# Patient Record
Sex: Female | Born: 1995 | Race: White | Hispanic: No | Marital: Single | Smoking: Former smoker
Health system: Southern US, Community
[De-identification: ages and names within clinical notes are randomized; demographics above are authoritative.]

## PROBLEM LIST (undated history)

## (undated) DIAGNOSIS — G40909 Epilepsy, unspecified, not intractable, without status epilepticus: Secondary | ICD-10-CM

## (undated) DIAGNOSIS — N201 Calculus of ureter: Secondary | ICD-10-CM

## (undated) DIAGNOSIS — M549 Dorsalgia, unspecified: Secondary | ICD-10-CM

## (undated) DIAGNOSIS — F909 Attention-deficit hyperactivity disorder, unspecified type: Secondary | ICD-10-CM

## (undated) DIAGNOSIS — G253 Myoclonus: Secondary | ICD-10-CM

## (undated) DIAGNOSIS — R569 Unspecified convulsions: Secondary | ICD-10-CM

## (undated) DIAGNOSIS — R4189 Other symptoms and signs involving cognitive functions and awareness: Secondary | ICD-10-CM

## (undated) DIAGNOSIS — F329 Major depressive disorder, single episode, unspecified: Secondary | ICD-10-CM

## (undated) HISTORY — DX: Attention-deficit hyperactivity disorder, unspecified type: F90.9

## (undated) HISTORY — DX: Epilepsy, unspecified, not intractable, without status epilepticus: G40.909

## (undated) HISTORY — DX: Dorsalgia, unspecified: M54.9

---
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
## 2017-01-04 ENCOUNTER — Telehealth: Payer: Self-pay

## 2017-01-04 ENCOUNTER — Other Ambulatory Visit: Payer: Self-pay | Admitting: Neurology

## 2017-01-04 NOTE — Telephone Encounter (Signed)
-----   Message from Van ClinesKaren M Aquino, MD sent at 01/04/2017 11:39 AM EDT ----- Regarding: MRI results Pls let patient/mother know that MRI brain is normal, no evidence of tumor, stroke, or bleed. Thanks

## 2017-01-04 NOTE — Telephone Encounter (Signed)
Spoke with pt's mother, Lynden AngCathy, relaying message below.

## 2017-01-12 ENCOUNTER — Other Ambulatory Visit: Payer: Self-pay | Admitting: Neurology

## 2017-01-16 ENCOUNTER — Ambulatory Visit (INDEPENDENT_AMBULATORY_CARE_PROVIDER_SITE_OTHER): Payer: 59 | Admitting: Neurology

## 2017-01-16 DIAGNOSIS — G40309 Generalized idiopathic epilepsy and epileptic syndromes, not intractable, without status epilepticus: Secondary | ICD-10-CM | POA: Diagnosis not present

## 2017-01-21 ENCOUNTER — Telehealth: Payer: Self-pay | Admitting: Neurology

## 2017-01-21 NOTE — Telephone Encounter (Signed)
Caller: Barbera   Urgent? No  Reason for the call: She said she received a call on Friday maybe regarding EEG results. Thanks

## 2017-01-21 NOTE — Telephone Encounter (Signed)
Did you try to call this patient?

## 2017-01-22 NOTE — Procedures (Signed)
ELECTROENCEPHALOGRAM REPORT  Date of Study: 01/16/2017  Patient's Name: Maria Day MRN: [id number] Date of Birth: May 14, 1996  Referring Provider: Dr. Patrcia DollyKaren Kimbrely Buckel  Clinical History: This is an 21 year old woman with generalized convulsions and body jerks.  Medications: Keppra, Prozac, Concerta  Technical Summary: A multichannel digital 1-hour sleep-deprived EEG recording measured by the international 10-20 system with electrodes applied with paste and impedances below 5000 ohms performed in our laboratory with EKG monitoring in an awake and asleep patient.  Hyperventilation and photic stimulation were performed.  The digital EEG was referentially recorded, reformatted, and digitally filtered in a variety of bipolar and referential montages for optimal display.    Description: The patient is awake and asleep during the recording.  During maximal wakefulness, there is a symmetric, medium voltage 9 Hz posterior dominant rhythm that attenuates with eye opening.  The record is symmetric.  During drowsiness and sleep, there is an increase in theta and delta slowing of the background.  Vertex waves and symmetric sleep spindles were seen.  Hyperventilation and photic stimulation did not elicit any abnormalities.  During sleep, there were occasional irregular generalized high voltage 4-5 Hz spike and polyspike and wave discharges with frontal predominance seen. There were rare discharges seen on the right hemisphere, some generalized discharges were more prominent on the right. There were no electrographic seizures seen.    EKG lead was unremarkable.  Impression: This 1-hour awake and asleep EEG is abnormal due to the presence of occasional generalized irregular high voltage 4-5 Hz spike and polyspike and wave discharges, some with right-sided predominance.  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 The right-sided predominance of some  epileptiform discharges also raises the possibility of focal seizures with secondary bisynchrony.   Patrcia DollyKaren [name], M.D.

## 2017-01-22 NOTE — Telephone Encounter (Signed)
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 She is doing well on Keppra, no issues since dose changes. F/u as scheduled in Nov.

## 2017-01-23 ENCOUNTER — Telehealth: Payer: Self-pay | Admitting: Neurology

## 2017-01-23 NOTE — Telephone Encounter (Signed)
Rx for Keppra was sent to pharmacy ([facility] on [address]) on 01/14/17 with 11 refills.  Pt needs to check with pharmacy.

## 2017-01-23 NOTE — Telephone Encounter (Signed)
PT left a voicemail message saying she needs a refill of her keppra, she is almost out

## 2017-01-24 ENCOUNTER — Other Ambulatory Visit: Payer: Self-pay

## 2017-01-24 ENCOUNTER — Telehealth: Payer: Self-pay | Admitting: Neurology

## 2017-01-24 MED ORDER — LEVETIRACETAM 750 MG PO TABS: 750.0000 mg | ORAL_TABLET | Freq: Two times a day (BID) | ORAL | 1 refills | Status: DC

## 2017-01-24 NOTE — Telephone Encounter (Signed)
Spoke with pt's mother, Maria Day, who states that [place] is in [name] working with her father over the summer and is about to run out of her Keppra (500mg ).  She was recently increase from 1 to 1 1/2 tablets BID.  Send Rx for Keppra 750, 1 tab BID,  #30 with 1 refill to [name].  Pt or her mother will call back before refill runs out to update pharmacy.

## 2017-01-24 NOTE — Telephone Encounter (Signed)
Caller: Ladrea  Urgent? No  Reason for the call: She was seen yesterday and had her dosage changed on her medication. She would like to see if Dr. Karel JarvisAquino would send a new refill over to [name]. She said since it has been increased she will need a refill sooner. Please Advise. Thanks

## 2017-01-25 ENCOUNTER — Other Ambulatory Visit: Payer: Self-pay

## 2017-01-25 MED ORDER — LEVETIRACETAM 750 MG PO TABS: 750.0000 mg | ORAL_TABLET | Freq: Two times a day (BID) | ORAL | 3 refills | Status: DC

## 2017-05-29 ENCOUNTER — Ambulatory Visit: Payer: 59 | Admitting: Neurology

## 2017-05-29 ENCOUNTER — Telehealth: Payer: Self-pay | Admitting: Neurology

## 2017-05-29 ENCOUNTER — Encounter: Payer: Self-pay | Admitting: Neurology

## 2017-05-29 VITALS — BP 98/64 | HR 90 | Ht 59.0 in | Wt 171.0 lb

## 2017-05-29 DIAGNOSIS — G40309 Generalized idiopathic epilepsy and epileptic syndromes, not intractable, without status epilepticus: Secondary | ICD-10-CM

## 2017-05-29 DIAGNOSIS — G43009 Migraine without aura, not intractable, without status migrainosus: Secondary | ICD-10-CM | POA: Diagnosis not present

## 2017-05-29 MED ORDER — FLUOXETINE HCL 60 MG PO TABS: ORAL_TABLET | ORAL | 6 refills | Status: DC

## 2017-05-29 MED ORDER — LEVETIRACETAM 750 MG PO TABS: 750.0000 mg | ORAL_TABLET | Freq: Two times a day (BID) | ORAL | 3 refills | Status: DC

## 2017-05-29 MED ORDER — SUMATRIPTAN SUCCINATE 50 MG PO TABS: ORAL_TABLET | ORAL | 6 refills | Status: DC

## 2017-05-29 NOTE — Patient Instructions (Signed)
1. Continue Keppra 750mg  twice a day 2. Increase Prozac to 60mg  at bedtime 3. Take Sumatriptan (Imitrex) 50mg : one tablet at onset of migraine. Do not take more than 2-3 times a week 4. Keep a headache calendar with your sleep and food patterns 5. Follow-up in 6 months, call for any changes  Seizure Precautions: 1. If medication has been prescribed for you to prevent seizures, take it exactly as directed.  Do not stop taking the medicine without talking to your doctor first, even if you have not had a seizure in a long time.   2. Avoid activities in which a seizure would cause danger to yourself or to others.  Don't operate dangerous machinery, swim alone, or climb in high or dangerous places, such as on ladders, roofs, or girders.  Do not drive unless your doctor says you may.  3. If you have any warning that you may have a seizure, lay down in a safe place where you can't hurt yourself.    4.  No driving for 6 months from last seizure, [place].   Please refer to the following link on the Epilepsy Foundation of America's website for more information: http://www.epilepsyfoundation.org/answerplace/Social/driving/drivingu.cfm   5.  Maintain good sleep hygiene. Avoid alcohol.  6.  Notify your neurology if you are planning pregnancy or if you become pregnant.  7.  Contact your doctor if you have any problems that may be related to the medicine you are taking.  8.  Call 911 and bring the patient back to the ED if:        A.  The seizure lasts longer than 5 minutes.       B.  The patient doesn't awaken shortly after the seizure  C.  The patient has new problems such as difficulty seeing, speaking or moving  D.  The patient was injured during the seizure  E.  The patient has a temperature over 102 F (39C)  F.  The patient vomited and now is having trouble breathing

## 2017-05-29 NOTE — Telephone Encounter (Signed)
Pt left a voicemail message asking for a call back regarding the medications discussed for her headaches this morning

## 2017-05-29 NOTE — Progress Notes (Signed)
NEUROLOGY FOLLOW UP OFFICE NOTE  Wyman SongsterKatelyn Nickles [id number] 1996/05/21  HISTORY OF PRESENT ILLNESS: I had the pleasure of seeing Wyman SongsterKatelyn Westra in follow-up in the neurology clinic on 05/29/2017.  The patient was last seen 5 months ago for seizures. She is again accompanied by her mother who helps supplement the history today.  Records and images were personally reviewed where available.  I personally reviewed MRI brain with and without contrast which did not show any acute changes, hippocampi symmetric without abnormal signal or enhancement. Her 1-hour sleep-deprived EEG was abnormal with occasional generalized irregular high voltage 4-5 Hz spike and polyspike and wave discharges, some with right-sided predominance. She was restarted on Keppra, dose increased to 750mg  twice a day due to report of twitching. She denies any further twitching. She lives with her father and only sees her mother on the weekends, her mother has not seen any staring episodes. She denies any further seizures. Her main concern is an increase in headaches occurring around once a week lasting 2-3 days. She describes a throbbing pain behind her left eyeball with associated nausea, vomiting, no visual obscurations. Aleve calms it down but does not help much. Sleep deprivation is a trigger. She however notes that if she has a headache, sleeping makes it worse. She has had some loss of appetite. She also reports a sensation in her mouth where she would swallow but saliva builds up and she feels like her throat closes up that she would vomit. These may occur separate from the headaches. She feels they may be associated with anxiety, but sometimes feels they are not related. This occurred twice last month. She provides additional history of recent bout of pain from kidney stone.  HPI 12/17/2016: This is a 21 yo LH woman with a history of ADD and 3 seizures. Her mother reports the first seizure was at age 35 in the setting of a high fever,  she had a convulsion in the car seat that lasted at least 5 minutes. She did well until age 21 when she had a convulsion in the bathroom in January 2015. She was getting ready for school and recalls ironing her hair, then her mother heard her fall back and hit her head on the tub. She denied any warning symptoms prior to the GTC, she had a headache and vomiting after and was diagnosed with a concussion. She started seeing pediatric neurologist Dr. Sharene SkeansHickling, initial EEG showed diffuse post-ictal slowing. She also reported symptoms suggestive of myoclonic jerks where she would have a jerk in her left hand and drop things, she calls them "flinches," where she would be holding a pencil or bottle then lose control of her hand. They can occur at any time, she does not recall having them prior to the GTC. She had a head CT in 2015 which was unremarkable. Repeat sleep-deprived wake EEG was normal. She was started on Keppra, however called several times about mood and personality changes, as well as weight gain. Her mother reports an almost 40-lb weight gain in 4 months. She was given instructions to taper off Keppra and start Zonisamide, but it appears she was lost to follow-up and did not start the medication. She was back in the ER last 10/08/16, she states she had a seizure in her sleep, she fell and hit her head on the dresser. Her mother thinks she was already awake, the episode occurred around 6am. She reports being a little stressed recently with her brother's passing. She denies any  sleep deprivation. She had a headache the night prior, but it was no different from her usual headaches. Her mother heard her fall and again found her having a convulsion that lasted a few minutes. She was wide-eyed and confused after, no focal weakness. She was brought to [facility] ER where bloodwork was unremarkable, she was discharged home on Keppra 500mg  BID which she has been overall tolerating.  She reports the "flinches" are not  frequent, occurring more when she is under a lot of stress. She denies any gaps in time, but sometimes feels like "I am not here, not in my body sort of thing." Last episode was a week ago. She was having them more at school, around once a week. Her mother states she occasionally gets fixated on something but can be distracted. She denies any olfactory/gustatory hallucinations, deja vu, rising epigastric sensation, focal numbness/tingling/weakness. She has headaches occurring around  1-2 times a week, usually on the left side, making her feel nauseated with sensitivity to lights and sounds. She usually gets very ill and grumpy and would sleep it off. She occasionally takes aspirin. When her mother tells her that she should tell her about these, she states "you just don't pay attention." She had struggled with some learning issues growing up and did a vocational program, she will be seeing a Chief Operating Officer tomorrow to "determine if she has autism." She takes Concerta to help with focusing at school, since stopping it now that she has graduated, she is not having as much headaches. Her mother thinks she had more headaches when she would not eat because Concerta made her lose appetite. She has occasional dizziness "like I'm not here." She denies any diplopia, dysarthria/dysphagia, neck/back pain, bowel/bladder dysfunction. She can get aggravated easily, and states that her depression plays a role with her anxiety. Prozac helps, she can tell a big difference if she does not take her medication. She had a kidney stone in February 2018 that she passed the next day. She graduated high school last Wednesday and plans to work. She is not driving yet.   Epilepsy Risk Factors:  She had a febrile seizure at age 68. Her mother reports there was a "knot in the umbilical cord" and meconium in amniotic fluid, she needed antibiotics for a few days. Otherwise she had a normal early development.  There is no history of CNS  infections such as meningitis/encephalitis, significant traumatic brain injury, neurosurgical procedures, or family history of seizures.  Prior AEDs: Keppra  PAST MEDICAL HISTORY: Past Medical History:  Diagnosis Date  . ADHD (attention deficit hyperactivity disorder)   . Cognitive impairment   . Myoclonus   . Reactive depression   . Seizures (HCC)     MEDICATIONS: Current Outpatient Medications on File Prior to Visit  Medication Sig Dispense Refill  . FLUoxetine (PROZAC) 20 MG capsule Take 40 mg by mouth every morning.   1  . levETIRAcetam (KEPPRA) 750 MG tablet Take 1 tablet (750 mg total) by mouth 2 (two) times daily. 180 tablet 3  . methylphenidate (CONCERTA) 54 MG CR tablet Take 54 mg by mouth every morning.     No current facility-administered medications on file prior to visit.     ALLERGIES: Allergies  Allergen Reactions  . Penicillins Rash  . Amoxicillin Rash    FAMILY HISTORY: Family History  Problem Relation Age of Onset  . Cancer Father        Leukemia  . Hyperlipidemia Maternal Grandfather   . Diabetes Paternal  Grandfather     SOCIAL HISTORY: Social History   Socioeconomic History  . Marital status: Single    Spouse name: Not on file  . Number of children: Not on file  . Years of education: Not on file  . Highest education level: Not on file  Social Needs  . Financial resource strain: Not on file  . Food insecurity - worry: Not on file  . Food insecurity - inability: Not on file  . Transportation needs - medical: Not on file  . Transportation needs - non-medical: Not on file  Occupational History  . Not on file  Tobacco Use  . Smoking status: Never Smoker  . Smokeless tobacco: Never Used  Substance and Sexual Activity  . Alcohol use: No  . Drug use: No  . Sexual activity: No  Other Topics Concern  . Not on file  Social History Narrative  . Not on file    REVIEW OF SYSTEMS: Constitutional: No fevers, chills, or sweats, no generalized  fatigue, change in appetite Eyes: No visual changes, double vision, eye pain Ear, nose and throat: No hearing loss, ear pain, nasal congestion, sore throat Cardiovascular: No chest pain, palpitations Respiratory:  No shortness of breath at rest or with exertion, wheezes GastrointestinaI: No nausea, vomiting, diarrhea, abdominal pain, fecal incontinence Genitourinary:  No dysuria, urinary retention or frequency Musculoskeletal:  No neck pain, back pain Integumentary: No rash, pruritus, skin lesions Neurological: as above Psychiatric: No depression, insomnia, anxiety Endocrine: No palpitations, fatigue, diaphoresis, mood swings, change in appetite, change in weight, increased thirst Hematologic/Lymphatic:  No anemia, purpura, petechiae. Allergic/Immunologic: no itchy/runny eyes, nasal congestion, recent allergic reactions, rashes  PHYSICAL EXAM: Vitals:   05/29/17 0900  BP: 98/64  Pulse: 90  SpO2: 97%   General: No acute distress Head:  Normocephalic/atraumatic Neck: supple, no paraspinal tenderness, full range of motion Heart:  Regular rate and rhythm Lungs:  Clear to auscultation bilaterally Back: No paraspinal tenderness Skin/Extremities: No rash, no edema Neurological Exam: alert and oriented to person, place, and time. No aphasia or dysarthria. Fund of knowledge is appropriate.  Recent and remote memory are intact.  Attention and concentration are normal.    Able to name objects and repeat phrases. Cranial nerves: Pupils equal, round, reactive to light.  Fundoscopic exam unremarkable, no papilledema. Extraocular movements intact with no nystagmus. Visual fields full. Facial sensation intact. No facial asymmetry. Tongue, uvula, palate midline.  Motor: Bulk and tone normal, muscle strength 5/5 throughout with no pronator drift.  Sensation to light touch intact.  No extinction to double simultaneous stimulation.  Deep tendon reflexes 2+ throughout, toes downgoing.  Finger to nose testing  intact.  Gait narrow-based and steady, able to tandem walk adequately.  Romberg negative.  IMPRESSION: This is a pleasant 21 yo LH woman with a history of febrile seizure at age 89, then 2 generalized convulsions soon after awakening at age 21 and most recently last 10/08/16. She also describes "flinches" suggestive of myoclonic jerks. Symptoms suggestive of primary generalized epilepsy. MRI brain with and without contrast which did not show any acute changes, hippocampi symmetric without abnormal signal or enhancement. Her 1-hour sleep-deprived EEG was abnormal with occasional generalized irregular high voltage 4-5 Hz spike and polyspike and wave discharges, some with right-sided predominance. 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 She is doing well on Keppra 750mg  BID with no further seizures or seizure-like symptoms. She is reporting an increase in migraines without aura. We discussed how Fluoxetine can help with migraines as well,  increase dose to 60mg  daily. She will try prn Imitrex for migraine rescue. She knows to minimize rescue medications to 2-3 a week to avoid rebound headaches. She does not drive. She will keep a headache calendar and follow-up in 6 months. She knows to call for any changes.   Thank you for allowing me to participate in her care.  Please do not hesitate to call for any questions or concerns.  The duration of this appointment visit was 25 minutes of face-to-face time with the patient.  Greater than 50% of this time was spent in counseling, explanation of diagnosis, planning of further management, and coordination of care.   Patrcia Dolly, M.D.   CC: Dr. Jolaine Click

## 2017-05-30 NOTE — Telephone Encounter (Signed)
Spoke with pt's mother.  She states that pt's father is concerned about pt being on so many medications and that pt's father does not necessarily agree with pt being given Imitrex.  I explained that Imitrex is not every day medication, but only PRN for onset of headaches.  Mother states that pt "does have some mild issues, mentally" and feels that pt exaggerated about her headaches with Dr. Karel JarvisAquino during her visit yesterday (mother was in during the appointment as well).  Mother does not believe that Prozac needs to be increase - would prefer if pt stayed on 40mg .  Please advise.

## 2017-05-30 NOTE — Telephone Encounter (Signed)
Dr. Karel JarvisAquino is out of the office.  I don't want to change her plan but they can hold on the increase until she is back.  I do think that the patient herself would have to be agreeable since she is of age, if she is a competant 21y/o or unless parents have some type of guardianship over her.

## 2017-05-31 DIAGNOSIS — G43009 Migraine without aura, not intractable, without status migrainosus: Secondary | ICD-10-CM

## 2017-05-31 HISTORY — DX: Migraine without aura, not intractable, without status migrainosus: G43.009

## 2017-06-05 NOTE — Telephone Encounter (Signed)
Pls call her mother and let her know that since she is [age], [name] will have to know why medications are not being changed, meaning be told about her parents concerns. Does mother want to let [name] know or should our office tell her? Thanks

## 2017-06-06 NOTE — Telephone Encounter (Signed)
I spoke with patient's mother and she will have Maria Day call our office.

## 2017-06-07 NOTE — Progress Notes (Signed)
Received [facility] from [facility].  Request placed in in-house backset to fbe sent ot medical records department.

## 2017-11-26 ENCOUNTER — Ambulatory Visit: Payer: 59 | Admitting: Neurology

## 2018-04-10 DIAGNOSIS — G4733 Obstructive sleep apnea (adult) (pediatric): Secondary | ICD-10-CM | POA: Insufficient documentation

## 2018-04-10 HISTORY — DX: Obstructive sleep apnea (adult) (pediatric): G47.33

## 2018-06-19 IMAGING — CT CT RENAL STONE PROTOCOL
2 of 4 series · 15 of 46 positions shown, 17 images · non-contrast
Comparison: None.

CLINICAL DATA: Acute onset of left flank and left-sided abdominal
pain. Nausea and vomiting. Leukocytosis and bacteria noted in the
patient's urine. Initial encounter.

EXAM:
CT ABDOMEN AND PELVIS WITHOUT CONTRAST
TECHNIQUE: Multidetector CT imaging of the abdomen and pelvis was performed
following the standard protocol without IV contrast.

[Series 2: axial st · axial · 0.75mm/px · z∈[-373,+62]mm · 12 of 95 slices shown, 14 images]
[im 4/95  soft-tissue]
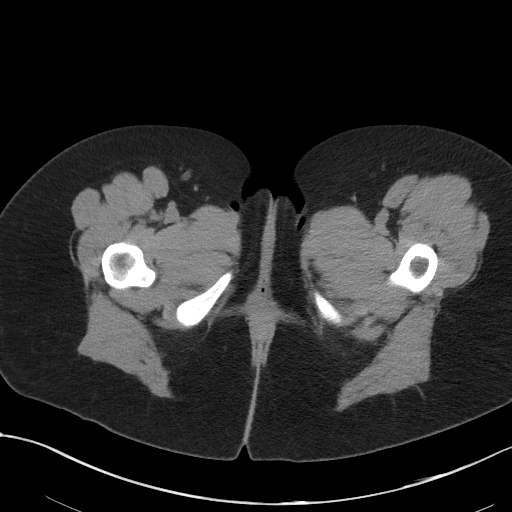
[im 4/95  bone]
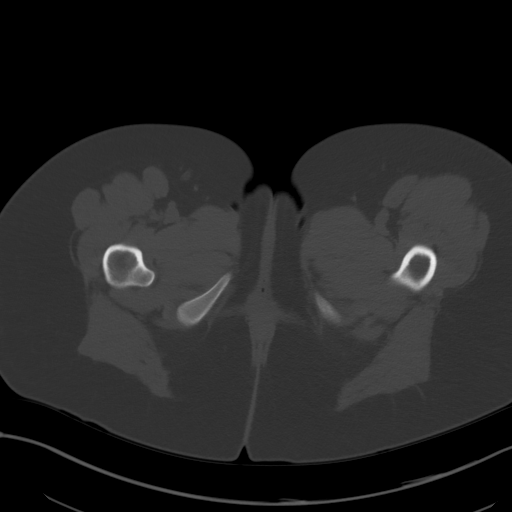
[im 12/95  soft-tissue]
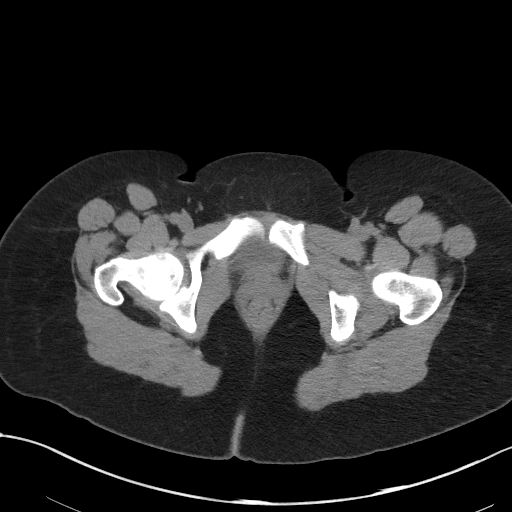
[im 20/95  soft-tissue]
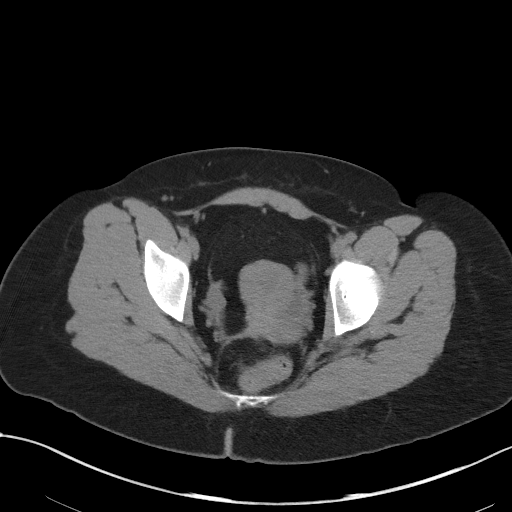
[im 28/95  soft-tissue]
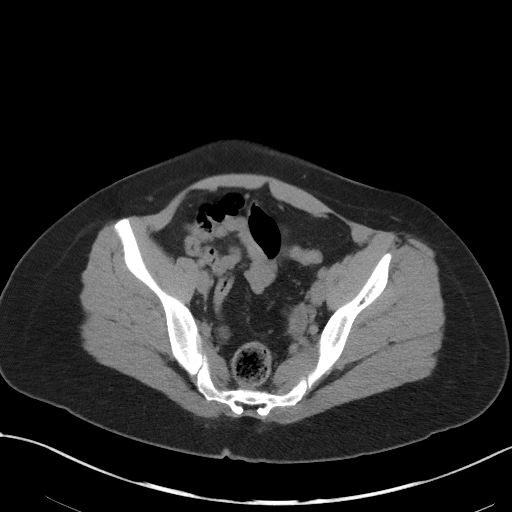
[im 36/95  soft-tissue]
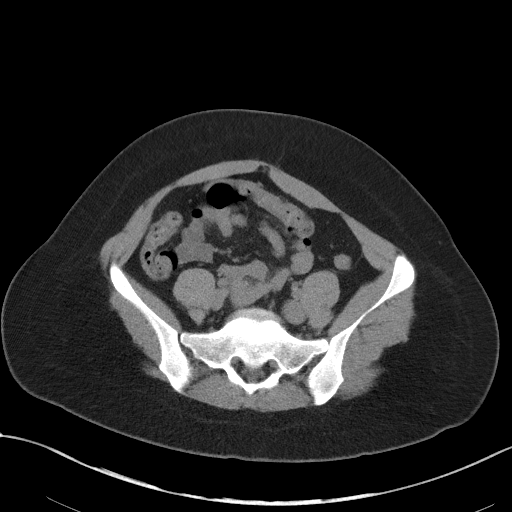
[im 44/95  soft-tissue]
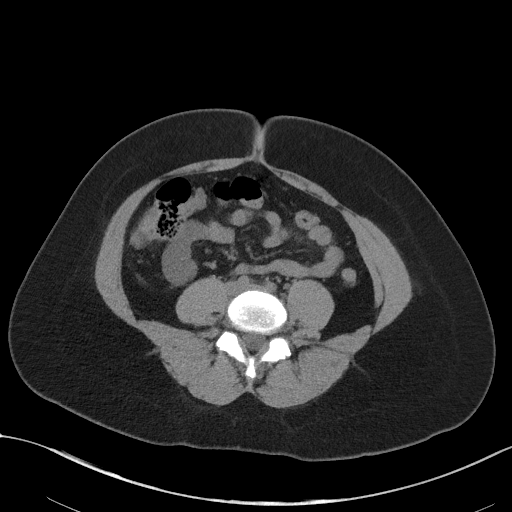
[im 51/95  soft-tissue]
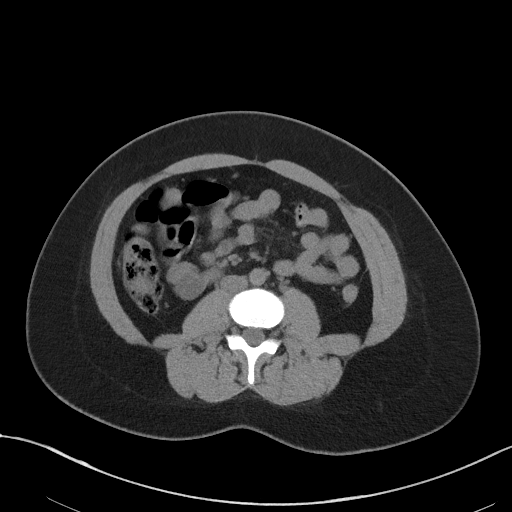
[im 59/95  soft-tissue]
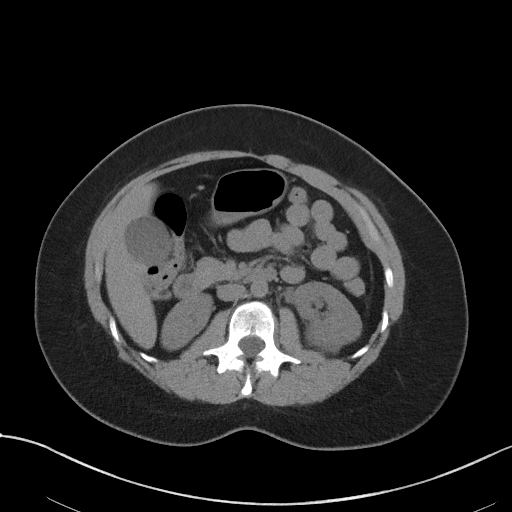
[im 67/95  soft-tissue]
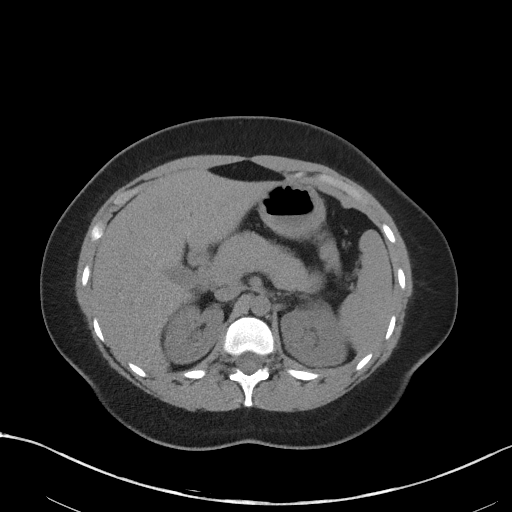
[im 67/95  bone]
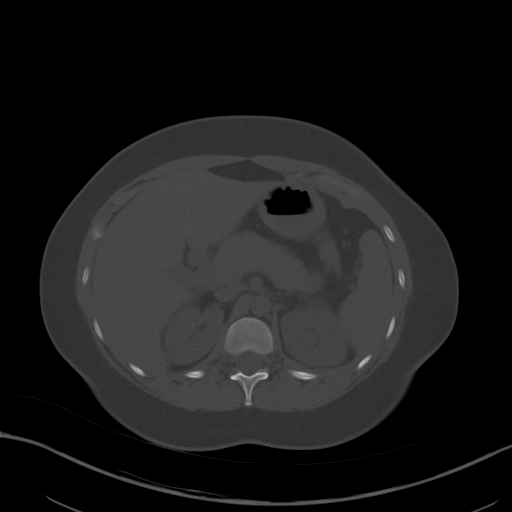
[im 75/95  soft-tissue]
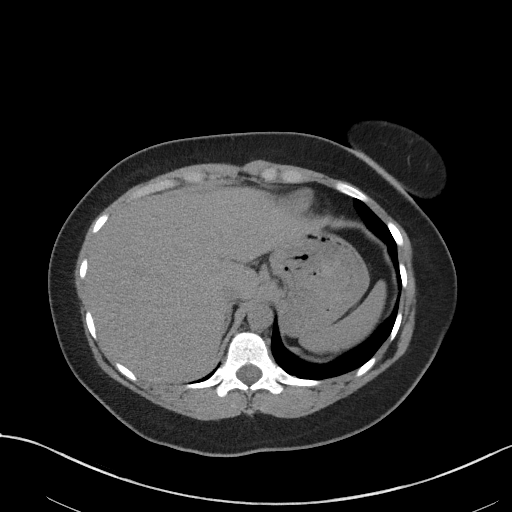
[im 83/95  soft-tissue]
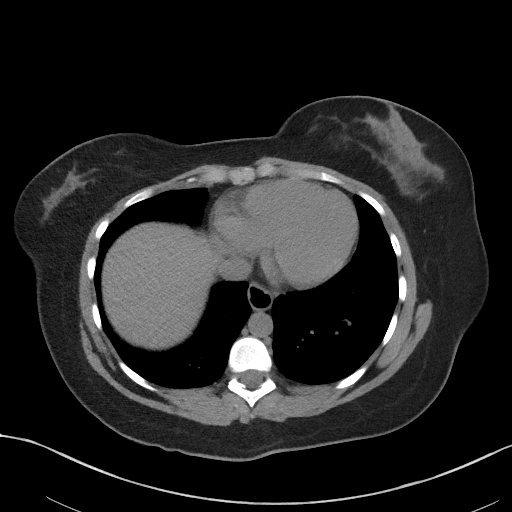
[im 91/95  soft-tissue]
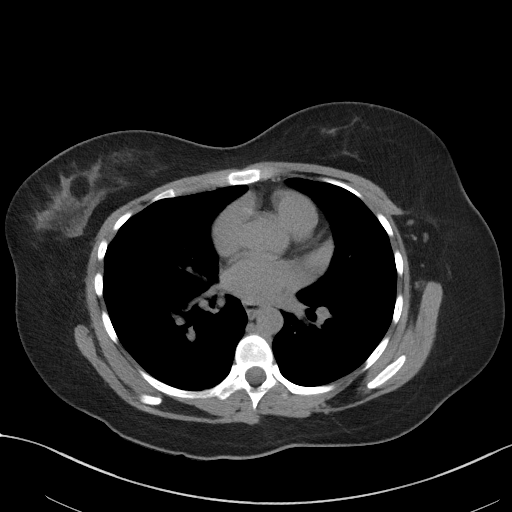

[Series 4: coronal st · coronal · 0.73mm/px · 3 of 82 slices shown]
[im 28/82  soft-tissue]
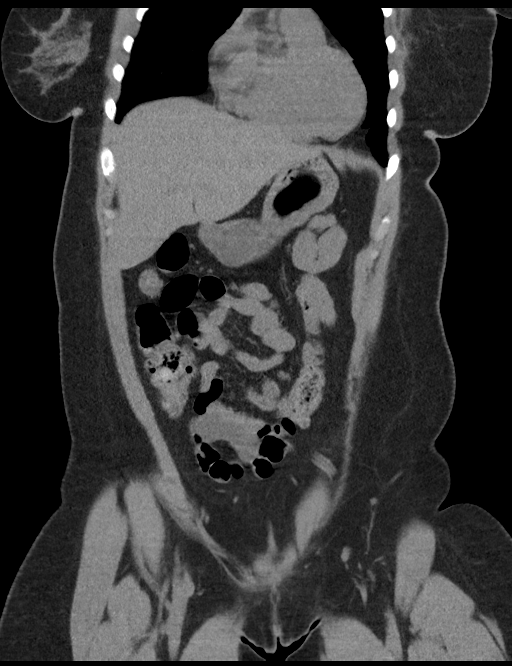
[im 37/82  soft-tissue]
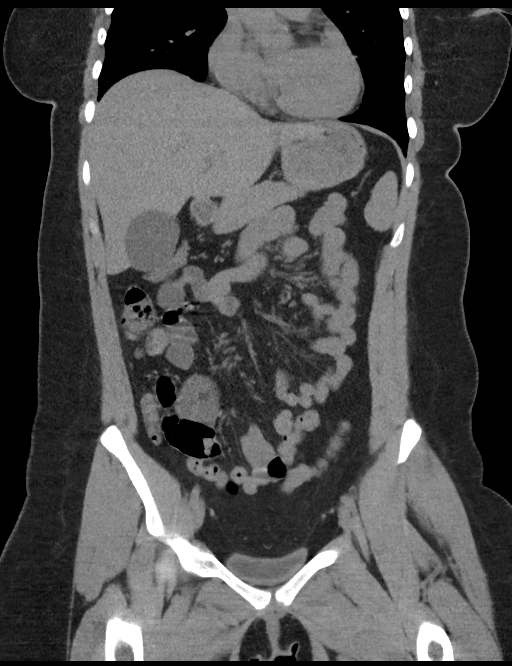
[im 46/82  soft-tissue]
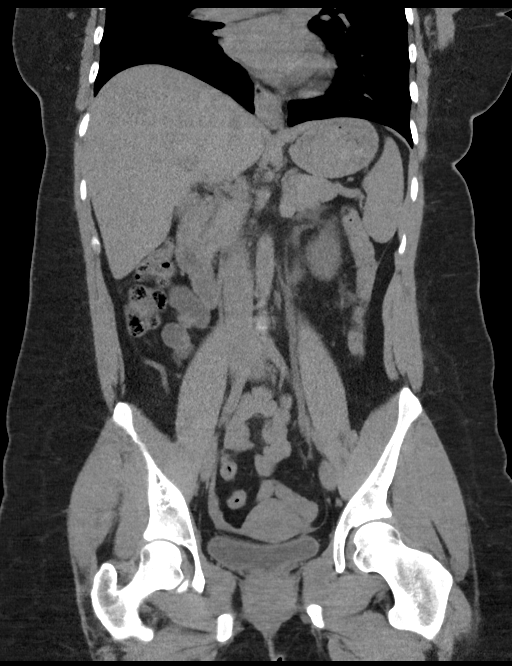

[15 of 46 positions shown; findings below may reference images not displayed]

FINDINGS: Lower chest: The visualized lung bases are grossly clear. The
visualized portions of the mediastinum are unremarkable.

Hepatobiliary: The liver is unremarkable in appearance. The
gallbladder is unremarkable in appearance. The common bile duct
remains normal in caliber.

Pancreas: The pancreas is within normal limits.

Spleen: The spleen is unremarkable in appearance.

Adrenals/Urinary Tract: The adrenal glands are unremarkable in
appearance.

There is minimal left-sided hydronephrosis, with left-sided
perinephric stranding and fluid. An obstructing 3 mm stone is noted
distally near the left vesicoureteral junction. Scattered tiny
bilateral renal stones are seen. The right kidney is otherwise
unremarkable.

Stomach/Bowel: The stomach is unremarkable in appearance. The small
bowel is within normal limits. The appendix is normal in caliber,
without evidence of appendicitis. The colon is unremarkable in
appearance.

Vascular/Lymphatic: The abdominal aorta is unremarkable in
appearance. The inferior vena cava is grossly unremarkable. No
retroperitoneal lymphadenopathy is seen. No pelvic sidewall
lymphadenopathy is identified.

Reproductive: The bladder is mildly distended and grossly
unremarkable. A small urachal remnant is seen. The uterus is grossly
unremarkable. A 3.4 cm left adnexal cystic lesion is noted. The
right ovary is unremarkable in appearance.

Other: No additional soft tissue abnormalities are seen.

Musculoskeletal: No acute osseous abnormalities are identified. The
visualized musculature is unremarkable in appearance.
IMPRESSION: 1. Minimal left-sided hydronephrosis, with obstructing 3 mm stone
noted distally near the left vesicoureteral junction.
2. Scattered tiny bilateral renal stones noted.
3. 3.4 cm left adnexal cystic lesion noted. This is likely
physiologic, though pelvic ultrasound could be considered for
further evaluation, as deemed clinically appropriate.

## 2021-04-26 ENCOUNTER — Ambulatory Visit (INDEPENDENT_AMBULATORY_CARE_PROVIDER_SITE_OTHER): Payer: BLUE CROSS/BLUE SHIELD | Admitting: Neurology

## 2021-04-26 ENCOUNTER — Encounter: Payer: Self-pay | Admitting: Neurology

## 2021-04-26 VITALS — BP 96/67 | HR 66 | Ht 60.0 in | Wt 157.0 lb

## 2021-04-26 DIAGNOSIS — G40309 Generalized idiopathic epilepsy and epileptic syndromes, not intractable, without status epilepticus: Secondary | ICD-10-CM

## 2021-04-26 DIAGNOSIS — Z5181 Encounter for therapeutic drug level monitoring: Secondary | ICD-10-CM | POA: Diagnosis not present

## 2021-04-26 NOTE — Patient Instructions (Signed)
Continue with Keppra 1000 mg twice a day  Continue with Lamotrigine 100 mg AM and 200 mg PM  Will obtain ASM level today  Continue with Fluoxetine 60 mg daily  48 hrs Ambulatory EEG  Return in 3 months

## 2021-04-26 NOTE — Progress Notes (Signed)
[facility]  PATIENT: Maria Day DOB: 06/30/1996  REFERRING CLINICIAN: Mendel Ryder, PA-C HISTORY FROM: Patient and mother  REASON FOR VISIT: Generalized epilepsy.    HISTORICAL  CHIEF COMPLAINT:  Chief Complaint  Patient presents with   New Patient (Initial Visit)    Rm 12, with mother, states she is currently having small seizures,     HISTORY OF PRESENT ILLNESS:  25 year old woman with past medical history of depression anxiety, generalized epilepsy who is presenting to establish care.  Per mother patient had a first seizure at the age of 4 in the setting of illness and fever, work-up at that time was unrevealling.  She was doing fine until the age of 66 where she had a second seizure.  At that time she was put on Keppra but they report patient having seizures every 2 years.  Keppra has been uptitrated, lamotrigine added and her last seizure was in 2019.  Her seizure was described as eyes looking up, rolled back ,she will have stiffness and this would last about 1 minute followed by postictal confusion.  No additional seizure like that since 2019.  Patient now describe a second type of seizure described as not feeling well, feels weak, will get dizzy and she had to sit down and will have minor shaking.  This lasts between 1 to 2 minutes and no postictal confusion.  She reported 3 weeks ago she had a cluster of 3 seizures, and this was in the setting of high stress.  Previously she was living with her father but due to friction between patient and father she had moved back to her mom's house in the past 3 years.  She said that the second type of seizure frequency increase due to high stress.  She has seen multiple neurologist/epileptologists for her seizure management.     Handedness: left handed   Seizure Type: Type I described as eyes rolled back, whole body stiffness lasting less than 1 minute followed by postictal confusion.  Last episode was in  2019. Type II described as feeling weak not feeling well, will get dizzy, has to sit down and mild shaking, sometimes she reports she is aware, no postictal confusion.   Current frequency: Type I last seizure 2019, type II had 5 seizures this month  Any injuries from seizures: concussion, bruises  Seizure risk factors: Febrile sz at the age 89, meconium staining, stay in couple days with IV, knot in umbilical cord, low apgar score   Previous ASMs: Keppra, Lamotrigine   Currenty ASMs: Keppra 1000 BID, Lamotrigine 100 mg AM and 200 mg PM  ASMs side effects: Tired all the time, no energy, like to lay down and sleep   Brain Images: Normal brain MRI  Previous EEGs: Occasional generalized irregular high-voltage 45 Hz spike and polyspike wave discharge   OTHER MEDICAL CONDITIONS: Anxiety/Depression, Epilepsy  REVIEW OF SYSTEMS: Full 14 system review of systems performed and negative with exception of: as noted in the HPI  ALLERGIES: Allergies  Allergen Reactions   Penicillins Rash   Amoxicillin Rash    HOME MEDICATIONS: Outpatient Medications Prior to Visit  Medication Sig Dispense Refill   FLUoxetine HCl 60 MG TABS Take 1 tablet at bedtime 30 tablet 6   lamoTRIgine (LAMICTAL) 100 MG tablet Take 1 tablet in the morning and 2 tablets in the evening.     levETIRAcetam (KEPPRA) 1000 MG tablet Take by mouth.     levETIRAcetam (KEPPRA) 750 MG tablet Take 1 tablet (  750 mg total) by mouth 2 (two) times daily. (Patient taking differently: Take 1,000 mg by mouth 2 (two) times daily.) 180 tablet 3   SUMAtriptan (IMITREX) 50 MG tablet Take 1 tablet at onset of migraine. Do not take more than 2-3 a week 10 tablet 6   No facility-administered medications prior to visit.    PAST MEDICAL HISTORY: Past Medical History:  Diagnosis Date   ADHD (attention deficit hyperactivity disorder)    Cognitive impairment    Myoclonus    Reactive depression    Seizures ([place])     PAST SURGICAL  HISTORY: History reviewed. No pertinent surgical history.  FAMILY HISTORY: Family History  Problem Relation Age of Onset   Cancer Father        Leukemia   Hyperlipidemia Maternal Grandfather    Diabetes Paternal Grandfather     SOCIAL HISTORY: Social History   Socioeconomic History   Marital status: Single    Spouse name: Not on file   Number of children: Not on file   Years of education: Not on file   Highest education level: Not on file  Occupational History   Not on file  Tobacco Use   Smoking status: Never   Smokeless tobacco: Never  Substance and Sexual Activity   Alcohol use: No   Drug use: No   Sexual activity: Never  Other Topics Concern   Not on file  Social History Narrative   Not on file   Social Determinants of Health   Financial Resource Strain: Not on file  Food Insecurity: Not on file  Transportation Needs: Not on file  Physical Activity: Not on file  Stress: Not on file  Social Connections: Not on file  Intimate Partner Violence: Not on file     PHYSICAL EXAM  GENERAL EXAM/CONSTITUTIONAL: Vitals:  Vitals:   04/26/21 0831  BP: 96/67  Pulse: 66  Weight: 157 lb (71.2 kg)  Height: 5' (1.524 m)   Body mass index is 30.66 kg/m. Wt Readings from Last 3 Encounters:  04/26/21 157 lb (71.2 kg)  05/29/17 171 lb (77.6 kg) (92 %, Z= 1.43)*  12/17/16 156 lb (70.8 kg) (87 %, Z= 1.10)*   * Growth percentiles are based on CDC (Girls, 2-20 Years) data.   Patient is in no distress; well developed, nourished and groomed; neck is supple  EYES: Pupils round and reactive to light, Visual fields full to confrontation, Extraocular movements intacts,  No results found.  MUSCULOSKELETAL: Gait, strength, tone, movements noted in Neurologic exam below  NEUROLOGIC: MENTAL STATUS:  No flowsheet data found. awake, alert, oriented to person, place and time recent and remote memory intact normal attention and concentration language fluent, comprehension  intact, naming intact fund of knowledge appropriate  CRANIAL NERVE: 2nd, 3rd, 4th, 6th - pupils equal and reactive to light, visual fields full to confrontation, extraocular muscles intact, no nystagmus 5th - facial sensation symmetric 7th - facial strength symmetric 8th - hearing intact 9th - palate elevates symmetrically, uvula midline 11th - shoulder shrug symmetric 12th - tongue protrusion midline  MOTOR:  normal bulk and tone, full strength in the BUE, BLE  SENSORY:  normal and symmetric to light touch, pinprick, temperature, vibration  COORDINATION:  finger-nose-finger, fine finger movements normal  REFLEXES:  deep tendon reflexes present and symmetric  GAIT/STATION:  normal    DIAGNOSTIC DATA (LABS, IMAGING, TESTING) - I reviewed patient records, labs, notes, testing and imaging myself where available.  Lab Results  Component Value Date  WBC 9.1 10/08/2016   HGB 13.9 10/08/2016   HCT 40.8 10/08/2016   MCV 88.5 10/08/2016   PLT 288 10/08/2016      Component Value Date/Time   NA 137 10/08/2016 0840   K 4.3 10/08/2016 0840   CL 106 10/08/2016 0840   CO2 24 10/08/2016 0840   GLUCOSE 92 10/08/2016 0840   BUN 11 10/08/2016 0840   CREATININE 0.87 10/08/2016 0840   CALCIUM 9.4 10/08/2016 0840   PROT 6.8 10/08/2016 0840   ALBUMIN 4.1 10/08/2016 0840   AST 24 10/08/2016 0840   ALT 16 10/08/2016 0840   ALKPHOS 64 10/08/2016 0840   BILITOT 0.4 10/08/2016 0840   GFRNONAA >60 10/08/2016 0840   GFRAA >60 10/08/2016 0840   No results found for: CHOL, HDL, LDLCALC, LDLDIRECT, TRIG No results found for: HGBA1C No results found for: VITAMINB12 No results found for: TSH  MRI Brain: Reported as normal  EEG 2018 This 1-hour awake and asleep EEG is abnormal due to the presence of occasional generalized irregular high voltage 4-5 Hz spike and polyspike and wave discharges, some with right-sided predominance.   Clinical Correlation of the above findings is  consistent with a primary generalized epilepsy. The right-sided predominance of some epileptiform discharges also raises the possibility of focal seizures with secondary bisynchrony   ASSESSMENT AND PLAN  25 y.o. year old female  with past medical history of anxiety/depression, generalized epilepsy who is presenting for management of her epilepsy.  Patient describes 2 types of seizures.  The first type is described as eyes rolled back generalized stiffening with postictal confusion, and the last event was in 2019.  Currently she is describing second type of seizure described as not feeling well, get dizzy, will lay down with mild shaking followed by no postictal confusion.  There is currently increasing frequency due to high stress, this month she had a total of 5 event despite being on Keppra 1000 mg twice daily and lamotrigine 100 mg in the morning and 200 mg at night.  I informed patient and mother that I am not sure that the second type of events are epileptic, she does have a history of of anxiety depression and she is on fluoxetine 60 mg daily for many years.  I informed them I will obtain a ambulatory EEG with the goal of getting a background assessment and also to capture the event if possible.  If the event happened to be epileptic then I will maximize the lamotrigine and/or add a third agent.  I also informed patient and mother that if the ambulatory EEG is normal and patient continued to have these seizure-like events, I will refer her to the epilepsy monitoring unit for capture and characterization.  I will contact her after the completion of the EEG otherwise I will see her in 3 months for follow-up    1. Generalized idiopathic epilepsy and epileptic syndromes, not intractable, without status epilepticus ([place])   2. Therapeutic drug monitoring       PLAN: Continue with Keppra 1000 mg twice a day  Continue with Lamotrigine 100 mg AM and 200 mg PM  Will obtain ASM level today  Continue  with Fluoxetine 60 mg daily  48 hrs Ambulatory EEG  Return in 3 months    Per [facility] statutes, patients with seizures are not allowed to drive until they have been seizure-free for six months.  Other recommendations include using caution when using heavy equipment or power tools. Avoid working on  ladders or at heights. Take showers instead of baths.  Do not swim alone.  Ensure the water temperature is not too high on the home water heater. Do not go swimming alone. Do not lock yourself in a room alone (i.e. bathroom). When caring for infants or small children, sit down when holding, feeding, or changing them to minimize risk of injury to the child in the event you have a seizure. Maintain good sleep hygiene. Avoid alcohol.  Also recommend adequate sleep, hydration, good diet and minimize stress.   During the Seizure  - First, ensure adequate ventilation and place patients on the floor on their left side  Loosen clothing around the neck and ensure the airway is patent. If the patient is clenching the teeth, do not force the mouth open with any object as this can cause severe damage - Remove all items from the surrounding that can be hazardous. The patient may be oblivious to what's happening and may not even know what he or she is doing. If the patient is confused and wandering, either gently guide him/her away and block access to outside areas - Reassure the individual and be comforting - Call 911. In most cases, the seizure ends before EMS arrives. However, there are cases when seizures may last over 3 to 5 minutes. Or the individual may have developed breathing difficulties or severe injuries. If a pregnant patient or a person with diabetes develops a seizure, it is prudent to call an ambulance. - Finally, if the patient does not regain full consciousness, then call EMS. Most patients will remain confused for about 45 to 90 minutes after a seizure, so you must use judgment in calling  for help. - Avoid restraints but make sure the patient is in a bed with padded side rails - Place the individual in a lateral position with the neck slightly flexed; this will help the saliva drain from the mouth and prevent the tongue from falling backward - Remove all nearby furniture and other hazards from the area - Provide verbal assurance as the individual is regaining consciousness - Provide the patient with privacy if possible - Call for help and start treatment as ordered by the caregiver   After the Seizure (Postictal Stage)  After a seizure, most patients experience confusion, fatigue, muscle pain and/or a headache. Thus, one should permit the individual to sleep. For the next few days, reassurance is essential. Being calm and helping reorient the person is also of importance.  Most seizures are painless and end spontaneously. Seizures are not harmful to others but can lead to complications such as stress on the lungs, brain and the heart. Individuals with prior lung problems may develop labored breathing and respiratory distress.     Orders Placed This Encounter  Procedures   Lamotrigine level   Levetiracetam level   CMP   AMBULATORY EEG    No orders of the defined types were placed in this encounter.   Return in about 3 months (around 07/27/2021).    Alric Ran, MD 04/26/2021, 10:51 AM  [facility] [address], [place], [place] [address] [phone]

## 2021-04-29 LAB — COMPREHENSIVE METABOLIC PANEL
ALT: 13 IU/L (ref 0–32)
AST: 14 IU/L (ref 0–40)
Albumin/Globulin Ratio: 1.7 (ref 1.2–2.2)
Albumin: 4.7 g/dL (ref 3.9–5.0)
Alkaline Phosphatase: 76 IU/L (ref 44–121)
BUN/Creatinine Ratio: 12 (ref 9–23)
BUN: 12 mg/dL (ref 6–20)
Bilirubin Total: 0.3 mg/dL (ref 0.0–1.2)
CO2: 21 mmol/L (ref 20–29)
Calcium: 9.9 mg/dL (ref 8.7–10.2)
Chloride: 105 mmol/L (ref 96–106)
Creatinine, Ser: 0.97 mg/dL (ref 0.57–1.00)
Globulin, Total: 2.7 g/dL (ref 1.5–4.5)
Glucose: 72 mg/dL (ref 70–99)
Potassium: 5 mmol/L (ref 3.5–5.2)
Sodium: 142 mmol/L (ref 134–144)
Total Protein: 7.4 g/dL (ref 6.0–8.5)
eGFR: 84 mL/min/{1.73_m2} (ref >59)

## 2021-04-29 LAB — LEVETIRACETAM LEVEL: Levetiracetam Lvl: 50.7 ug/mL — ABNORMAL HIGH (ref 10.0–40.0)

## 2021-04-29 LAB — LAMOTRIGINE LEVEL: Lamotrigine Lvl: 9.6 ug/mL (ref 2.0–20.0)

## 2021-05-01 ENCOUNTER — Telehealth: Payer: Self-pay

## 2021-05-01 NOTE — Telephone Encounter (Signed)
Spoke to pt's mother, results given per MD. Voiced understanding. States they are still awaiting EEG appt.  Mother states Pt's seizures were worse when she was smoking marijuana frequently. Asked for Dr Karie Georges recommendation.

## 2021-05-01 NOTE — Progress Notes (Signed)
Please call and advise the patient that the recent labs we checked were within normal limits. We checked Keppra and Lamotrigine level with CMP. No further action is required on these tests at this time. Please remind patient to keep any upcoming appointments or tests and to call us with any interim questions, concerns, problems or updates. Thanks,   Windell Norfolk, MD

## 2021-05-01 NOTE — Telephone Encounter (Signed)
-----   Message from Windell Norfolk, MD sent at 05/01/2021 12:19 PM EDT ----- Please call and advise the patient that the recent labs we checked were within normal limits. We checked Keppra and Lamotrigine level with CMP. No further action is required on these tests at this time. Please remind patient to keep any upcoming appointments or tests and to call us with any interim questions, concerns, problems or updates. Thanks,   Windell Norfolk, MD

## 2021-05-04 NOTE — Telephone Encounter (Signed)
Unable to reach mother, left message X2.   Windell Norfolk, MD

## 2021-05-30 ENCOUNTER — Telehealth: Payer: Self-pay | Admitting: Neurology

## 2021-05-30 DIAGNOSIS — G43009 Migraine without aura, not intractable, without status migrainosus: Secondary | ICD-10-CM

## 2021-05-30 MED ORDER — FLUOXETINE HCL 60 MG PO TABS: ORAL_TABLET | ORAL | 1 refills | Status: DC

## 2021-05-30 NOTE — Telephone Encounter (Signed)
I spoke to patient's mother. She is helping her daughter get established with a new primary care physician and needs fluoxetine 60mg , one tab daily sent to the pharmacy.  Her mother requested additional refills to allow them enough time to get in with the new doctor. Per vo by Dr. , okay to provide. #[id number] sent.  Also, she said they have not been contacted to schedule her ambulatory EEG yet. I will sent this request to our referral team.

## 2021-05-30 NOTE — Telephone Encounter (Signed)
Pt's mother, Jamyra Zweig (on [place]) would like a call from the nurse to discuss refilling Fluoxetine.  Informed Ms. Mackley cannot refill request for Fluoxetione because was not prescribed Dr. Teresa Coombs

## 2021-05-31 NOTE — Telephone Encounter (Signed)
Order did not fall into our queue. I will fill out referral form and place in pod for MD signature.

## 2021-06-05 ENCOUNTER — Telehealth: Payer: Self-pay | Admitting: Neurology

## 2021-06-05 MED ORDER — LEVETIRACETAM 1000 MG PO TABS: 1000.0000 mg | ORAL_TABLET | Freq: Two times a day (BID) | ORAL | 0 refills | Status: DC

## 2021-06-05 NOTE — Telephone Encounter (Signed)
Pt called, need a refill for levETIRAcetam (KEPPRA) 1000 MG tablet. Was told at last office visit to call when I ran out of levETIRAcetam (KEPPRA) 1000 MG tablet and physician would refill the medication. Have 3 tablets left, need prescription [id number]  Informed pt medication could not send refill due to medication was prescribed by another physician.

## 2021-06-05 NOTE — Telephone Encounter (Signed)
I spoke to the patient. She needs a refill of levetiracetam 1000mg , one tablet twice daily. She would like a 90-day rx. ____________________________________ Per Dr. office note on 04/26/21:  Continue with Keppra 1000 mg twice a day  Continue with Lamotrigine 100 mg AM and 200 mg PM. _____________________________________ She is a new patient to our office. This rx has not been sent by 04/28/21 in the past. Dr. [place] is out of the office this week. Request will be sent to work-in MD for authorization in his absence.

## 2021-06-07 NOTE — Telephone Encounter (Signed)
I called [facility]. They confirmed the rx was received on 06/05/21 and placed on file for her. I left patient a message letting her know they will fill it today. The prescription is not set up  auto-refill and she may want to consider changing that status with them. Otherwise, her refills will sit on her file until she contacts them.

## 2021-06-07 NOTE — Telephone Encounter (Signed)
Pt called asking when she will get the 90 day supply cause she will run out after tomorrow. Pt requesting a call back.

## 2021-06-19 DIAGNOSIS — G40309 Generalized idiopathic epilepsy and epileptic syndromes, not intractable, without status epilepticus: Secondary | ICD-10-CM

## 2021-06-21 DIAGNOSIS — G40309 Generalized idiopathic epilepsy and epileptic syndromes, not intractable, without status epilepticus: Secondary | ICD-10-CM

## 2021-06-30 ENCOUNTER — Encounter: Payer: Self-pay | Admitting: Neurology

## 2021-06-30 ENCOUNTER — Other Ambulatory Visit: Payer: Self-pay | Admitting: Neurology

## 2021-06-30 DIAGNOSIS — G40309 Generalized idiopathic epilepsy and epileptic syndromes, not intractable, without status epilepticus: Secondary | ICD-10-CM

## 2021-06-30 DIAGNOSIS — Z5181 Encounter for therapeutic drug level monitoring: Secondary | ICD-10-CM

## 2021-06-30 NOTE — Procedures (Signed)
History:  62 yof with generalized epilepsy.   TECHNICAL DESCRIPTION: This AVEEG was performed using equipment provided by [employer] utilizing Bluetooth (Trackit ) amplifiers with continuous EEGT attended video collection using encrypted remote transmission via [employer] secured cellular tower network with data rates for each AVEEG performed. This is a Therapist, music AVEEG, obtained, according to the 10-20 international electrode placement system, reformatted digitally into referential and bipolar montages. Data was acquired with a minimum of 21 bipolar connections and sampled at a minimum rate of 250 cycles per second per channel, maximum rate of 450 cycles per second per channel and two channels for EKG. The entire AVEEG study was recorded through cable and or radio telemetry for subsequent analysis. Specified epochs of the AVEEG data were identified at the direction of the subject by the depression of a push button by the patient. Each patient's event file included data acquired two minutes prior to the push button activation and continuing until two minutes afterwards. AVEEG files were reviewed on [facility] [employer] by [employer] provided [employer] with a digital high frequency filter set at 70 Hz and a low frequency filter set at 0.1 Hz with a paper speed of 32mm/s resulting in 10 seconds per digital page. This entire AVEEG was reviewed by the EEG Technologist. Random time samples, random sleep samples, clips, patient initiated push button files with included patient daily diary logs, EEG Technologist bookmarked note data was reviewed and verified for accuracy and validity by the governing reading neurologist in full details. This AEEGV was fully compliant with all requirements for CPT 97500 for setup, patient education, take down and administered by an EEG technologist.   INTERMITTENT MONITORING WITH VIDEO TECHNICAL DESCRIPTION:  This Long-Term  AVEEG was monitored intermittently by a qualified EEG technologist for the entirety of the recording; quality check-ins were performed at a minimum of every two hours, checking and documenting real-time data and video to assure the integrity and quality of the recording (e.g., camera position, electrode integrity and impedance), and identify the need for maintenance. For intermittent monitoring, an EEG Technologist monitored no more than 12 patients concurrently. Diagnostic video was captured at least 80% of the time during the recording.   TECHNOLOGIST EVENTS: No clear epileptiform activity was detected by the reviewing neurodiagnostic technologist for further evaluation.  PATIENT EVENTS:  The patient pushed the event button 1  time during the AVEEG recording reporting seeing flashing of light when closing her eyes. There were no changes in the EEG recording.  EEGT test press #1 time with pt at the beginning of the tracing.  TIME SAMPLES: 1 minute of every hour recorded are reviewed as random time samples.   SLEEP SAMPLES: 5 minutes of every 24 hour recorded sleep cycle are reviewed as random sleep samples.   BACKGROUND EEG: This AVEEG consists of well-modulated bilateral synchronous and symmetrical background in the alpha frequencies in the awake state.  AWAKE: The posterior dominant rhythm was characterized by symmetric and reactive 9 Hz activity with eyes closed.   SLEEP: Stage two sleep displayed Sleep Spindles and Vertex Waveform components. All other sleep stages appeared symmetrical and synchronous with regards to K-Complexes, and REM.  EKG: Normal sinus rhythm.   AVEEG Technical Summary of Findings Impression and Interpretations: This is a normal video ambulatory EEG. There was one event of seeing flashes of light when closing eyes with no changes in the EEG background. A normal EEG does not exclude a diagnosis of  epilepsy.      Windell Norfolk, MD [facility]

## 2021-06-30 NOTE — Procedures (Signed)
° ° °  History:  28 yof with generalized epilepsy.   This RVEEG was performed using equipment provided by [employer] utilizing Bluetooth ( Trackit ) amplifiers with EEGT attended video collected and performed with a 19-channel digital VEEG, Data was acquired with a minimum of 21 bipolar connections and sampled at a minimum rate of 250 cycles per second per channel and one channel for EKG, obtained according to 10-20 international electrode placement system, reformatted digitally into referential and bipolar montages, reviewed and verified for accuracy and validity by the governing reading neurologist in full details.   Activating Procedure:  Photic Stimulation was Performed. Photic Driving Response seen. Hyperventilation was not performed due to pt contraindication of Droplet Precaution-COVID-19.  No clear epileptiform activity was detected by the reviewing neurodiagnostic technologist for further evaluation.  Intermittent artifacts could not be resolved. All impedance under 10K ohms.  Pt is awake, alert, cooperative and orientated Xs 4 of 4? correct.  EEGT Attended Video Recording: Yes  Hardware - (Trackit) by [employer] - [employer]    RVEEG Description:  This RVEEG was obtained during awake and drowsy states, during wakefulness posterior dominant rhythm was 9 Hz, alpha, well-modulated, moderate in voltage, and attenuated appropriately with eye opening. During the recording there was no focal or lateralized epileptiform activity or focal slowing noted. Drowsiness was marked by stage I sleep. Hyperventilation was not performed due to pt contraindication of Droplet Precaution. Photic stimulation resulted in posterior driving responses at multiple frequencies, without any abnormal photo paroxysmal responses. EKG normal sinus rhythm.    RVEEG Technical Summary of Findings Impression and Interpretations: This is a normal routine EEG. A normal EEG does not exclude a diagnosis of  epilepsy.     Windell Norfolk, MD [facility]

## 2021-07-04 ENCOUNTER — Telehealth: Payer: Self-pay | Admitting: *Deleted

## 2021-07-04 ENCOUNTER — Other Ambulatory Visit: Payer: Self-pay | Admitting: Neurology

## 2021-07-04 DIAGNOSIS — G40309 Generalized idiopathic epilepsy and epileptic syndromes, not intractable, without status epilepticus: Secondary | ICD-10-CM

## 2021-07-04 NOTE — Progress Notes (Signed)
Please contact patient and inform her that her recent routine and video EEG (Brain wave test) were normal. In particular, there were no epileptiform discharges and no seizures. The event with the flashing lights is not an epileptic seizure. No further action is required on this test at this time. Please keep any upcoming appointments or tests and  call us with any interim questions, concerns, problems or updates. Thanks,   Alric Ran, MD

## 2021-07-04 NOTE — Telephone Encounter (Signed)
-----   Message from Alric Ran, MD sent at 07/04/2021  7:59 AM EST ----- Please contact patient and inform her that her recent routine and video EEG (Brain wave test) were normal. In particular, there were no epileptiform discharges and no seizures. No further action is required on this test at this time. Please keep any upcoming appointments or tests and  call us with any interim questions, concerns, problems or updates. Thanks,   Alric Ran, MD

## 2021-07-04 NOTE — Telephone Encounter (Signed)
Pt's mother returned call, she is having issues with cell phone but we can leave a detailed message. She did want to let doctor know that Lorel did have a seizure New Years night around 8:30.

## 2021-07-04 NOTE — Telephone Encounter (Signed)
I spoke to the patient [phone]). Reports having a seizure at home, on 07/02/21, while laying in the floor. Witnessed by her mother - mild body tremors, inability to respond to verbal stimuli. Lasting about one minute (no more than 2). Followed by significant headache. Denies any missed medications. She is aware of her EEG results. 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 Additionally, she has been told she has sporadic jerking motions during the night when she sleeps.   She would like a call back to discuss next step for better control of symptoms.

## 2021-07-04 NOTE — Telephone Encounter (Signed)
-----   Message from Windell Norfolk, MD sent at 07/04/2021  8:00 AM EST ----- Please contact patient and inform her that her recent routine and video EEG (Brain wave test) were normal. In particular, there were no epileptiform discharges and no seizures. The event with the flashing lights is not an epileptic seizure. No further action is required on this test at this time. Please keep any upcoming appointments or tests and  call us with any interim questions, concerns, problems or updates. Thanks,   Windell Norfolk, MD

## 2021-07-04 NOTE — Telephone Encounter (Signed)
The primary number in the chart belongs to her mother, Cathy Funez. She answered the call but the phone went dead. I tried to reach her back twice and the call was routed straight to voicemail. I left a detailed message with the information below (ok per DPR). Additionally, I reminded her to continue current plan and keep the pending appt on 07/31/21.  °

## 2021-07-04 NOTE — Telephone Encounter (Signed)
I will recommend EMU admission for characterization of events.

## 2021-07-04 NOTE — Telephone Encounter (Signed)
I spoke to her mother on DPR Lat[place] Texeira [phone]). States the patient is willing to move forward with EMU admission. Her mother would like the call to set up the appt and talk about the cost/insurance coverage.

## 2021-07-04 NOTE — Progress Notes (Signed)
Please contact patient and inform her that her recent routine and video EEG (Brain wave test) were normal. In particular, there were no epileptiform discharges and no seizures. No further action is required on this test at this time. Please keep any upcoming appointments or tests and  call us with any interim questions, concerns, problems or updates. Thanks,   Alric Ran, MD

## 2021-07-04 NOTE — Telephone Encounter (Signed)
The primary number in the chart belongs to her mother, Jassmin Macon. She answered the call but the phone went dead. I tried to reach her back twice and the call was routed straight to voicemail. I left a detailed message with the information below (ok per DPR). Additionally, I reminded her to continue current plan and keep the pending appt on 07/31/21.

## 2021-07-31 ENCOUNTER — Encounter: Payer: Self-pay | Admitting: Neurology

## 2021-07-31 ENCOUNTER — Ambulatory Visit: Payer: BLUE CROSS/BLUE SHIELD | Admitting: Neurology

## 2021-07-31 VITALS — BP 117/74 | HR 72 | Ht 60.0 in | Wt 157.0 lb

## 2021-07-31 DIAGNOSIS — G40309 Generalized idiopathic epilepsy and epileptic syndromes, not intractable, without status epilepticus: Secondary | ICD-10-CM | POA: Diagnosis not present

## 2021-07-31 MED ORDER — LAMOTRIGINE 25 MG PO TABS: 25.0000 mg | ORAL_TABLET | Freq: Every day | ORAL | 0 refills | Status: DC

## 2021-07-31 NOTE — Patient Instructions (Addendum)
Increase Lamotrigine as follow   Week 1: 125 mg AM and 200 mg PM  Week 2: 150 mg AM and 200 mg PM   Week 3: 175 mg AM and 200 mg PM   Week 4: 200 mg AM and 200 mg PM   Please call me after 1 month to switch the medications. Continue with Keppra 1000 mg twice daily  EMU admission as scheduled  Follow up in 3 months

## 2021-07-31 NOTE — Progress Notes (Signed)
[facility]  PATIENT: Maria Day DOB: [birth year]  REFERRING CLINICIAN: No ref. provider found HISTORY FROM: Patient and mother  REASON FOR VISIT: Generalized epilepsy.    HISTORICAL  CHIEF COMPLAINT:  Chief Complaint  Patient presents with   Follow-up    Reports last seizure this past Saturday 07/29/21, c/o fatigue, headache and dizziness, concerned that  nexplanon implanted on 04/2021 is when pts seizures became unstable     INTERVAL HISTORY 07/31/2021:  Patient presents today for follow-up, she is accompanied by her mom.  Last visit plan was to continue ASM and to continue to monitor the symptoms.  She did have a routine EEG which was normal, during the test she did have an event with photic stimulation which was nonepileptic.  She continued to have spells, last 1 was on January 28 after work. Patient reported she is able to tell when she before having these event because she feels tired sometimes dizzy and afterward she has bad headache.  I have referred her to the EMU for characterization which is scheduled for August 21, 2021.  She reported she is has increased stress, she is very worried, she worries herself a lot about work and her family.   HISTORY OF PRESENT ILLNESS:  26 year old woman with past medical history of depression anxiety, generalized epilepsy who is presenting to establish care.  Per mother patient had a first seizure at the age of 4 in the setting of illness and fever, work-up at that time was unrevealling.  She was doing fine until the age of 24 where she had a second seizure.  At that time she was put on Keppra but they report patient having seizures every 2 years.  Keppra has been uptitrated, lamotrigine added and her last seizure was in 2019.  Her seizure was described as eyes looking up, rolled back ,she will have stiffness and this would last about 1 minute followed by postictal confusion.  No additional seizure like that since  2019.  Patient now describe a second type of seizure described as not feeling well, feels weak, will get dizzy and she had to sit down and will have minor shaking.  This lasts between 1 to 2 minutes and no postictal confusion.  She reported 3 weeks ago she had a cluster of 3 seizures, and this was in the setting of high stress.  Previously she was living with her father but due to friction between patient and father she had moved back to her mom's house in the past 3 years.  She said that the second type of seizure frequency increase due to high stress.  She has seen multiple neurologist/epileptologists for her seizure management.    Handedness: left handed   Seizure Type: Type I described as eyes rolled back, whole body stiffness lasting less than 1 minute followed by postictal confusion.  Last episode was in 2019. Type II described as feeling weak not feeling well, will get dizzy, has to sit down and mild shaking, sometimes she reports she is aware, no postictal confusion.   Current frequency: Type I last seizure 2019, type II had 5 seizures this month  Any injuries from seizures: concussion, bruises  Seizure risk factors: Febrile sz at the age 13, meconium staining, stay in couple days with IV, knot in umbilical cord, low apgar score   Previous ASMs: Keppra, Lamotrigine   Currenty ASMs: Keppra 1000 BID, Lamotrigine 100 mg AM and 200 mg PM  ASMs side effects: Tired all the time,  no energy, like to lay down and sleep   Brain Images: Normal brain MRI  Previous EEGs: Occasional generalized irregular high-voltage 45 Hz spike and polyspike wave discharge   OTHER MEDICAL CONDITIONS: Anxiety/Depression, Epilepsy  REVIEW OF SYSTEMS: Full 14 system review of systems performed and negative with exception of: as noted in the HPI  ALLERGIES: Allergies  Allergen Reactions   Penicillins Rash   Amoxicillin Rash    HOME MEDICATIONS: Outpatient Medications Prior to Visit  Medication Sig  Dispense Refill   FLUoxetine HCl 60 MG TABS Take 1 tablet at bedtime 90 tablet 1   lamoTRIgine (LAMICTAL) 100 MG tablet Take 1 tablet in the morning and 2 tablets in the evening.     levETIRAcetam (KEPPRA) 1000 MG tablet Take 1 tablet (1,000 mg total) by mouth 2 (two) times daily. 180 tablet 0   No facility-administered medications prior to visit.    PAST MEDICAL HISTORY: Past Medical History:  Diagnosis Date   ADHD (attention deficit hyperactivity disorder)    Cognitive impairment    Myoclonus    Reactive depression    Seizures ([place])     PAST SURGICAL HISTORY: History reviewed. No pertinent surgical history.  FAMILY HISTORY: Family History  Problem Relation Age of Onset   Cancer Father        Leukemia   Hyperlipidemia Maternal Grandfather    Diabetes Paternal Grandfather     SOCIAL HISTORY: Social History   Socioeconomic History   Marital status: Single    Spouse name: Not on file   Number of children: Not on file   Years of education: Not on file   Highest education level: Not on file  Occupational History   Not on file  Tobacco Use   Smoking status: Never   Smokeless tobacco: Never  Substance and Sexual Activity   Alcohol use: No   Drug use: No   Sexual activity: Never  Other Topics Concern   Not on file  Social History Narrative   Not on file   Social Determinants of Health   Financial Resource Strain: Not on file  Food Insecurity: Not on file  Transportation Needs: Not on file  Physical Activity: Not on file  Stress: Not on file  Social Connections: Not on file  Intimate Partner Violence: Not on file     PHYSICAL EXAM  GENERAL EXAM/CONSTITUTIONAL: Vitals:  Vitals:   07/31/21 1013  BP: 117/74  Pulse: 72  Weight: 157 lb (71.2 kg)  Height: 5' (1.524 m)   Body mass index is 30.66 kg/m. Wt Readings from Last 3 Encounters:  07/31/21 157 lb (71.2 kg)  04/26/21 157 lb (71.2 kg)  05/29/17 171 lb (77.6 kg) (92 %, Z= 1.43)*   * Growth  percentiles are based on CDC (Girls, 2-20 Years) data.   Patient is in no distress; well developed, nourished and groomed; neck is supple  EYES: Pupils round and reactive to light, Visual fields full to confrontation, Extraocular movements intacts,  No results found.  MUSCULOSKELETAL: Gait, strength, tone, movements noted in Neurologic exam below  NEUROLOGIC: MENTAL STATUS:  No flowsheet data found. awake, alert, oriented to person, place and time recent and remote memory intact normal attention and concentration language fluent, comprehension intact, naming intact fund of knowledge appropriate  CRANIAL NERVE: 2nd, 3rd, 4th, 6th - pupils equal and reactive to light, visual fields full to confrontation, extraocular muscles intact, no nystagmus 5th - facial sensation symmetric 7th - facial strength symmetric 8th - hearing intact 9th -  palate elevates symmetrically, uvula midline 11th - shoulder shrug symmetric 12th - tongue protrusion midline  MOTOR:  normal bulk and tone, full strength in the BUE, BLE  SENSORY:  normal and symmetric to light touch, pinprick, temperature, vibration  COORDINATION:  finger-nose-finger, fine finger movements normal  REFLEXES:  deep tendon reflexes present and symmetric  GAIT/STATION:  normal    DIAGNOSTIC DATA (LABS, IMAGING, TESTING) - I reviewed patient records, labs, notes, testing and imaging myself where available.  Lab Results  Component Value Date   WBC 9.1 10/08/2016   HGB 13.9 10/08/2016   HCT 40.8 10/08/2016   MCV 88.5 10/08/2016   PLT 288 10/08/2016      Component Value Date/Time   NA 142 04/26/2021 0920   K 5.0 04/26/2021 0920   CL 105 04/26/2021 0920   CO2 21 04/26/2021 0920   GLUCOSE 72 04/26/2021 0920   GLUCOSE 92 10/08/2016 0840   BUN 12 04/26/2021 0920   CREATININE 0.97 04/26/2021 0920   CALCIUM 9.9 04/26/2021 0920   PROT 7.4 04/26/2021 0920   ALBUMIN 4.7 04/26/2021 0920   AST 14 04/26/2021 0920   ALT  13 04/26/2021 0920   ALKPHOS 76 04/26/2021 0920   BILITOT 0.3 04/26/2021 0920   GFRNONAA >60 10/08/2016 0840   GFRAA >60 10/08/2016 0840   No results found for: CHOL, HDL, LDLCALC, LDLDIRECT, TRIG No results found for: HGBA1C No results found for: VITAMINB12 No results found for: TSH  MRI Brain 2018 at [facility]: Reported as normal  EEG 2018 This 1-hour awake and asleep EEG is abnormal due to the presence of occasional generalized irregular high voltage 4-5 Hz spike and polyspike and wave discharges, some with right-sided predominance.   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 The right-sided predominance of some epileptiform discharges also raises the possibility of focal seizures with secondary bisynchrony   Routine EEG 06/30/2021 This is a normal routine EEG. A normal EEG does not exclude a diagnosis of epilepsy.  Ambulatory EEG 06/30/2021 This is a normal video ambulatory EEG. There was one event of seeing flashes of light when closing eyes with no changes in the EEG background. A normal EEG does not exclude a diagnosis of epilepsy.   ASSESSMENT AND PLAN  26 y.o. year old female  with past medical history of anxiety/depression, generalized epilepsy who is presenting for follow up for management of her epilepsy.  Her first seizure type described as eyes rolled back, generalized stiffening with postictal confusion has not happened since 2019.  I do believe these are epileptic. Now she is reporting events where she is not feeling well, she will get dizzy, laid down and will have mild shaking with no postictal confusion.  She continued to have these events despite being on 1000 mg of Keppra twice daily and lamotrigine 100 mg in the morning and 200 mg at night.  Patient does report that these events are brought on by stress.  Her EEG was normal, she did have an event with photic stimulation without any EEG correlate.  Her last event was  on January 28, she reported having 2 seizures that day because she was stressed out at work.  Again I informed patient and mother that I am not sure if the second type of event are epileptic versus nonepileptic.  I will schedule them for a EMU admission for characterization.  They are comfortable with plan, actually the EMU admission is scheduled for February 20 th.  In the meantime I will increase gradually her lamotrigine to 200 mg twice daily and I will see her in 3 months for follow-up.     1. Generalized idiopathic epilepsy and epileptic syndromes, not intractable, without status epilepticus ([place])      Patient Instructions  Increase Lamotrigine as follow   Week 1: 125 mg AM and 200 mg PM  Week 2: 150 mg AM and 200 mg PM   Week 3: 175 mg AM and 200 mg PM   Week 4: 200 mg AM and 200 mg PM   Please call me after 1 month to switch the medications. Continue with Keppra 1000 mg twice daily  EMU admission as scheduled  Follow up in 3 months     Per [facility] statutes, patients with seizures are not allowed to drive until they have been seizure-free for six months.  Other recommendations include using caution when using heavy equipment or power tools. Avoid working on ladders or at heights. Take showers instead of baths.  Do not swim alone.  Ensure the water temperature is not too high on the home water heater. Do not go swimming alone. Do not lock yourself in a room alone (i.e. bathroom). When caring for infants or small children, sit down when holding, feeding, or changing them to minimize risk of injury to the child in the event you have a seizure. Maintain good sleep hygiene. Avoid alcohol.  Also recommend adequate sleep, hydration, good diet and minimize stress.   During the Seizure  - First, ensure adequate ventilation and place patients on the floor on their left side  Loosen clothing around the neck and ensure the airway is patent. If the patient is clenching the teeth, do  not force the mouth open with any object as this can cause severe damage - Remove all items from the surrounding that can be hazardous. The patient may be oblivious to what's happening and may not even know what he or she is doing. If the patient is confused and wandering, either gently guide him/her away and block access to outside areas - Reassure the individual and be comforting - Call 911. In most cases, the seizure ends before EMS arrives. However, there are cases when seizures may last over 3 to 5 minutes. Or the individual may have developed breathing difficulties or severe injuries. If a pregnant patient or a person with diabetes develops a seizure, it is prudent to call an ambulance. - Finally, if the patient does not regain full consciousness, then call EMS. Most patients will remain confused for about 45 to 90 minutes after a seizure, so you must use judgment in calling for help. - Avoid restraints but make sure the patient is in a bed with padded side rails - Place the individual in a lateral position with the neck slightly flexed; this will help the saliva drain from the mouth and prevent the tongue from falling backward - Remove all nearby furniture and other hazards from the area - Provide verbal assurance as the individual is regaining consciousness - Provide the patient with privacy if possible - Call for help and start treatment as ordered by the caregiver   After the Seizure (Postictal Stage)  After a seizure, most patients experience confusion, fatigue, muscle pain and/or a headache. Thus, one should permit the individual to sleep. For the next few days, reassurance is essential. Being calm and helping reorient the person is also of importance.  Most seizures are painless and end  spontaneously. Seizures are not harmful to others but can lead to complications such as stress on the lungs, brain and the heart. Individuals with prior lung problems may develop labored breathing and  respiratory distress.     No orders of the defined types were placed in this encounter.   Meds ordered this encounter  Medications   lamoTRIgine (LAMICTAL) 25 MG tablet    Sig: Take 1 tablet (25 mg total) by mouth daily.    Dispense:  75 tablet    Refill:  0     Return in about 3 months (around 10/29/2021).    Alric Ran, MD 07/31/2021, 2:20 PM  [facility] [address], [place], [place] [address] [phone]

## 2021-08-18 ENCOUNTER — Other Ambulatory Visit: Payer: Self-pay | Admitting: Neurology

## 2021-08-18 LAB — SARS CORONAVIRUS 2 (TAT 6-24 HRS): SARS Coronavirus 2: NEGATIVE

## 2021-08-21 ENCOUNTER — Inpatient Hospital Stay (HOSPITAL_COMMUNITY)
Admission: RE | Admit: 2021-08-21 | Discharge: 2021-08-25 | DRG: 101 | Disposition: A | Discharge status: Home / Self Care | Payer: Medicare Other | Source: Ambulatory Visit | Attending: Neurology | Admitting: Neurology

## 2021-08-21 ENCOUNTER — Other Ambulatory Visit: Payer: Self-pay

## 2021-08-21 ENCOUNTER — Inpatient Hospital Stay (HOSPITAL_COMMUNITY): Payer: Medicare Other

## 2021-08-21 ENCOUNTER — Encounter (HOSPITAL_COMMUNITY): Payer: Self-pay | Admitting: Neurology

## 2021-08-21 DIAGNOSIS — F419 Anxiety disorder, unspecified: Secondary | ICD-10-CM | POA: Diagnosis present

## 2021-08-21 DIAGNOSIS — F909 Attention-deficit hyperactivity disorder, unspecified type: Secondary | ICD-10-CM | POA: Diagnosis present

## 2021-08-21 DIAGNOSIS — R251 Tremor, unspecified: Secondary | ICD-10-CM | POA: Diagnosis present

## 2021-08-21 DIAGNOSIS — G4700 Insomnia, unspecified: Secondary | ICD-10-CM | POA: Diagnosis present

## 2021-08-21 DIAGNOSIS — Z79899 Other long term (current) drug therapy: Secondary | ICD-10-CM | POA: Diagnosis not present

## 2021-08-21 DIAGNOSIS — R4189 Other symptoms and signs involving cognitive functions and awareness: Secondary | ICD-10-CM | POA: Diagnosis present

## 2021-08-21 DIAGNOSIS — R569 Unspecified convulsions: Secondary | ICD-10-CM | POA: Diagnosis not present

## 2021-08-21 DIAGNOSIS — G40409 Other generalized epilepsy and epileptic syndromes, not intractable, without status epilepticus: Secondary | ICD-10-CM | POA: Diagnosis present

## 2021-08-21 LAB — CBC WITH DIFFERENTIAL/PLATELET
Abs Immature Granulocytes: 0.04 10*3/uL (ref 0.00–0.07)
Basophils Absolute: 0 10*3/uL (ref 0.0–0.1)
Basophils Relative: 0 %
Eosinophils Absolute: 0.2 10*3/uL (ref 0.0–0.5)
Eosinophils Relative: 3 %
HCT: 42.4 % (ref 36.0–46.0)
Hemoglobin: 14.2 g/dL (ref 12.0–15.0)
Immature Granulocytes: 1 %
Lymphocytes Relative: 28 %
Lymphs Abs: 2 10*3/uL (ref 0.7–4.0)
MCH: 30 pg (ref 26.0–34.0)
MCHC: 33.5 g/dL (ref 30.0–36.0)
MCV: 89.5 fL (ref 80.0–100.0)
Monocytes Absolute: 0.6 10*3/uL (ref 0.1–1.0)
Monocytes Relative: 8 %
Neutro Abs: 4.2 10*3/uL (ref 1.7–7.7)
Neutrophils Relative %: 60 %
Platelets: 290 10*3/uL (ref 150–400)
RBC: 4.74 MIL/uL (ref 3.87–5.11)
RDW: 13.3 % (ref 11.5–15.5)
WBC: 7 10*3/uL (ref 4.0–10.5)
nRBC: 0 % (ref 0.0–0.2)

## 2021-08-21 LAB — COMPREHENSIVE METABOLIC PANEL
ALT: 13 U/L (ref 0–44)
AST: 17 U/L (ref 15–41)
Albumin: 3.9 g/dL (ref 3.5–5.0)
Alkaline Phosphatase: 64 U/L (ref 38–126)
Anion gap: 9 (ref 5–15)
BUN: 10 mg/dL (ref 6–20)
CO2: 25 mmol/L (ref 22–32)
Calcium: 9.2 mg/dL (ref 8.9–10.3)
Chloride: 105 mmol/L (ref 98–111)
Creatinine, Ser: 0.95 mg/dL (ref 0.44–1.00)
GFR, Estimated: >60 mL/min (ref >60)
Glucose, Bld: 84 mg/dL (ref 70–99)
Potassium: 4.2 mmol/L (ref 3.5–5.1)
Sodium: 139 mmol/L (ref 135–145)
Total Bilirubin: 0.3 mg/dL (ref 0.3–1.2)
Total Protein: 7 g/dL (ref 6.5–8.1)

## 2021-08-21 LAB — PROTIME-INR
INR: 1 (ref 0.8–1.2)
Prothrombin Time: 13.4 seconds (ref 11.4–15.2)

## 2021-08-21 LAB — MAGNESIUM: Magnesium: 2.3 mg/dL (ref 1.7–2.4)

## 2021-08-21 LAB — HIV ANTIBODY (ROUTINE TESTING W REFLEX): HIV Screen 4th Generation wRfx: NONREACTIVE

## 2021-08-21 LAB — PHOSPHORUS: Phosphorus: 2.6 mg/dL (ref 2.5–4.6)

## 2021-08-21 LAB — RAPID URINE DRUG SCREEN, HOSP PERFORMED
Amphetamines: NOT DETECTED
Barbiturates: NOT DETECTED
Benzodiazepines: NOT DETECTED
Cocaine: NOT DETECTED
Opiates: NOT DETECTED
Tetrahydrocannabinol: NOT DETECTED

## 2021-08-21 LAB — TSH: TSH: 3.595 u[IU]/mL (ref 0.350–4.500)

## 2021-08-21 MED ORDER — LORAZEPAM 2 MG/ML IJ SOLN
2.0000 mg | INTRAMUSCULAR | Status: DC | PRN
  Administered 2021-08-22: 2 mg via INTRAVENOUS
  Filled 2021-08-21: qty 1

## 2021-08-21 MED ORDER — FLUOXETINE HCL 20 MG PO CAPS
60.0000 mg | ORAL_CAPSULE | Freq: Every day | ORAL | Status: DC
  Administered 2021-08-22 – 2021-08-23 (×2): 60 mg via ORAL
  Filled 2021-08-21 (×4): qty 3

## 2021-08-21 MED ORDER — DIPHENHYDRAMINE HCL 25 MG PO CAPS
25.0000 mg | ORAL_CAPSULE | Freq: Every evening | ORAL | Status: DC | PRN
Start: 2021-08-21 — End: 2021-08-25
  Administered 2021-08-24: 25 mg via ORAL
  Filled 2021-08-21: qty 1

## 2021-08-21 MED ORDER — ACETAMINOPHEN 325 MG PO TABS
650.0000 mg | ORAL_TABLET | ORAL | Status: DC | PRN
  Administered 2021-08-21 – 2021-08-24 (×4): 650 mg via ORAL
  Filled 2021-08-21 (×4): qty 2

## 2021-08-21 MED ORDER — HYDROXYZINE HCL 25 MG PO TABS
25.0000 mg | ORAL_TABLET | Freq: Three times a day (TID) | ORAL | Status: DC | PRN
  Administered 2021-08-21 – 2021-08-24 (×3): 25 mg via ORAL
  Filled 2021-08-21 (×3): qty 1

## 2021-08-21 MED ORDER — ACETAMINOPHEN 650 MG RE SUPP: 650.0000 mg | RECTAL | Status: DC | PRN

## 2021-08-21 MED ORDER — SODIUM CHLORIDE 0.9% FLUSH
3.0000 mL | Freq: Two times a day (BID) | INTRAVENOUS | Status: DC
  Administered 2021-08-21 – 2021-08-25 (×8): 3 mL via INTRAVENOUS

## 2021-08-21 MED ORDER — LABETALOL HCL 5 MG/ML IV SOLN: 5.0000 mg | INTRAVENOUS | Status: DC | PRN

## 2021-08-21 MED ORDER — ENOXAPARIN SODIUM 40 MG/0.4ML IJ SOSY
40.0000 mg | PREFILLED_SYRINGE | INTRAMUSCULAR | Status: DC
  Administered 2021-08-21 – 2021-08-25 (×5): 40 mg via SUBCUTANEOUS
  Filled 2021-08-21 (×5): qty 0.4

## 2021-08-21 MED ORDER — LAMOTRIGINE 25 MG PO TABS
50.0000 mg | ORAL_TABLET | Freq: Every day | ORAL | Status: AC
  Administered 2021-08-21: 50 mg via ORAL
  Filled 2021-08-21: qty 2

## 2021-08-21 NOTE — Progress Notes (Signed)
Seizure precautions: Rails padded, oxygen set up, and suction in place.  °

## 2021-08-21 NOTE — Plan of Care (Signed)
Addressed anxiety and Urine sample sent.  Problem: Education: Goal: Expressions of having a comfortable level of knowledge regarding the disease process will increase Outcome: Progressing   Problem: Coping: Goal: Ability to adjust to condition or change in health will improve Outcome: Progressing Goal: Ability to identify appropriate support needs will improve Outcome: Progressing   Problem: Health Behavior/Discharge Planning: Goal: Compliance with prescribed medication regimen will improve Outcome: Progressing   Problem: Medication: Goal: Risk for medication side effects will decrease Outcome: Progressing   Problem: Clinical Measurements: Goal: Complications related to the disease process, condition or treatment will be avoided or minimized Outcome: Progressing Goal: Diagnostic test results will improve Outcome: Progressing   Problem: Safety: Goal: Verbalization of understanding the information provided will improve Outcome: Progressing   Problem: Self-Concept: Goal: Level of anxiety will decrease Outcome: Progressing Goal: Ability to verbalize feelings about condition will improve Outcome: Progressing

## 2021-08-21 NOTE — Progress Notes (Signed)
°  Transition of Care [facility]) Screening Note   Patient Details  Name: Maria Day Date of Birth: Oct 11, 1995   [facility]) CM/SW Contact:    Kermit Balo, RN Phone Number: 08/21/2021, 12:02 PM    Transition of Care Department [facility]) has reviewed patient and no TOC needs have been identified at this time. We will continue to monitor patient advancement through interdisciplinary progression rounds. If new patient transition needs arise, please place a TOC consult.

## 2021-08-21 NOTE — Progress Notes (Signed)
Anxiety medication given. Pt has been emotional because she feels comfortable at home.She has been crying but she is okay.

## 2021-08-21 NOTE — Progress Notes (Signed)
EMU Hookup complete - no initial skin breakdown.  Sitter duties explained. [facility] notified and monitoring.  Push button tested with [facility].  

## 2021-08-21 NOTE — H&P (Signed)
CC: seizure  History is obtained from: patient, chart review  HPI: Maria Day is a 26 y.o. female with past medical history of generalized epilepsy, depression, anxiety who is admitted to epilepsy monitoring unit for characterization of spells.  Epilepsy history: Patient and her mother at bedside reports she had her first seizure at the age of 4 in the setting of fever described as whole body shaking lasting for "very long time". She remained seizure-free until the age of 43 after which she started having episodes described as whole body stiffening, eye rolling to the back, foaming at mouth lasting for few minutes followed by confusion.  She was started on Keppra but continued to have episodes every few months.  Last episode was in 2019 per patient's mother.  In October 2022, patient moved with her mother (was living with her father before that).  Since then patient's mother has noticed a different kind of episodes.  Prior to the episode patient reports not feeling well (described as feeling dizzy).  She runs to her mother and then falls down, has whole body jerking, eyes closed, urinary or bowel incontinence.  Patient had slight tongue bite at the tip of the tongue once during the episode.  Episodes happen 1-2 times per month lasting on average about 2 minutes each. Most recent episode about 2 weeks ago. Of note, patient's mother states each of the episodes have happened when patient has other friends or family in the house.  Both patient and mother report significant stressors.  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   Past Medical History:  Diagnosis Date   ADHD (attention deficit hyperactivity disorder)    Cognitive  impairment    Myoclonus    Reactive depression    Seizures (HCC)     Family History  Problem Relation Age of Onset   Cancer Father        Leukemia   Hyperlipidemia Maternal Grandfather    Diabetes Paternal Grandfather     Social History:  reports that she has never smoked. She has never used smokeless tobacco. She reports that she does not drink alcohol and does not use drugs.   Medications Prior to Admission  Medication Sig Dispense Refill Last Dose   FLUoxetine HCl 60 MG TABS Take 1 tablet at bedtime 90 tablet 1 08/21/2021   lamoTRIgine (LAMICTAL) 100 MG tablet Take 1 tablet in the morning and 2 tablets in the evening.   08/21/2021   lamoTRIgine (LAMICTAL) 25 MG tablet Take 1 tablet (25 mg total) by mouth daily. 75 tablet 0 08/20/2021   levETIRAcetam (KEPPRA) 1000 MG tablet Take 1 tablet (1,000 mg total) by mouth 2 (two) times daily. 180 tablet 0 08/21/2021      Exam: Current vital signs: BP 115/64 (BP Location: Right Arm)    Pulse 85    Temp 98.1 F (36.7 C) (Oral)    Resp 18    Ht 5' (1.524 m)    Wt 80.3 kg    SpO2 96%    BMI 34.57 kg/m  Vital signs in last 24 hours: Temp:  [98.1 F (36.7 C)] 98.1 F (36.7 C) (02/20 0800) Pulse Rate:  [85] 85 (02/20 0800) Resp:  [18] 18 (02/20 0800) BP: (115)/(64) 115/64 (  02/20 0800) SpO2:  [96 %] 96 % (02/20 0800) Weight:  [80.3 kg] 80.3 kg (02/20 0800)   Physical Exam  Constitutional: Appears well-developed and well-nourished.  Psych: Affect appropriate to situation Eyes: No scleral injection HENT: No OP obstrucion Head: Normocephalic.  Cardiovascular: Normal rate and regular rhythm.  Respiratory: Effort normal, non-labored breathing GI: Soft.  No distension. There is no tenderness.  Skin: Warm Neuro: AOx3, cranial nerves II to XII grossly intact, 5/5 in all 4 extremities  I have reviewed labs in epic and the results pertinent to this consultation are: CBC: No results for input(s): WBC, NEUTROABS, HGB, HCT, MCV, PLT in the  last 168 hours.  Basic Metabolic Panel:  Lab Results  Component Value Date   NA 142 04/26/2021   K 5.0 04/26/2021   CO2 21 04/26/2021   GLUCOSE 72 04/26/2021   BUN 12 04/26/2021   CREATININE 0.97 04/26/2021   CALCIUM 9.9 04/26/2021   GFRNONAA >60 10/08/2016   GFRAA >60 10/08/2016   Lipid Panel: No results found for: LDLCALC HgbA1c: No results found for: HGBA1C Urine Drug Screen:     Component Value Date/Time   LABOPIA NONE DETECTED 07/07/2013 1115   COCAINSCRNUR NONE DETECTED 07/07/2013 1115   LABBENZ NONE DETECTED 07/07/2013 1115   AMPHETMU NONE DETECTED 07/07/2013 1115   THCU NONE DETECTED 07/07/2013 1115   LABBARB NONE DETECTED 07/07/2013 1115    Alcohol Level     Component Value Date/Time   ETH <11 07/07/2013 0820     I have reviewed the report obtained: MRI head with and without IV contrast 12/31/2016 performed at [facility]  Normal MRI brain   ASSESSMENT/PLAN: 26 year old female with history of epilepsy who is admitted to epilepsy monitoring unit for characterization of spells.  Epilepsy - start video EEG monitoring for characterization of spells  -Hold Keppra, reduce lamotrigine to 50 mg tonight and hold tomorrow -seizure precautions -As needed IV Ativan 2 mg for clinical seizure-like activity  Depression -Continue home medications  Anxiety Patient reports significant anxiety, will order Vistaril as needed  Insomnia - Patient reports trouble sleeping when she is at a new place and states melatonin has not helped in the past -Will order Benadryl as needed  Tremors - Patient reports intermittent tremors.  Will order TSH  Thank you for allowing [place] to participate in the care of this patient. If you have any further questions, please contact  me or neurohospitalist.   Lindie Spruce [facility]

## 2021-08-21 NOTE — Progress Notes (Signed)
Pt arrived to the unit with mom. Oriented to room. EEG placed. IV placed in RFA. Tele placed. Educated to call when she has to use the restroom. Medications reviewed and given to her mom.

## 2021-08-22 ENCOUNTER — Inpatient Hospital Stay (HOSPITAL_COMMUNITY): Payer: Medicare Other

## 2021-08-22 MED ORDER — LEVETIRACETAM IN NACL 1000 MG/100ML IV SOLN
1000.0000 mg | INTRAVENOUS | Status: AC
  Administered 2021-08-22: 1000 mg via INTRAVENOUS
  Filled 2021-08-22: qty 100

## 2021-08-22 MED ORDER — LIDOCAINE HCL URETHRAL/MUCOSAL 2 % EX GEL
1.0000 "application " | Freq: Four times a day (QID) | CUTANEOUS | Status: DC | PRN
  Filled 2021-08-22 (×2): qty 6

## 2021-08-22 MED ORDER — LAMOTRIGINE 25 MG PO TABS
50.0000 mg | ORAL_TABLET | Freq: Two times a day (BID) | ORAL | Status: DC
  Administered 2021-08-22 – 2021-08-23 (×3): 50 mg via ORAL
  Filled 2021-08-22 (×4): qty 2

## 2021-08-22 NOTE — Procedures (Addendum)
Patient Name: Maria Day  MRN: [id number]  Epilepsy Attending: Charlsie Quest  Referring Physician/Provider: Dr Lindie Spruce Duration: 08/21/2021 7846 to 08/22/2021 9629  Patient history: 26 year old female with history of epilepsy now with different semiology of spells admitted for characterization.  Level of alertness: Awake, asleep  AEDs during EEG study: LTG, LEV  Technical aspects: This EEG study was done with scalp electrodes positioned according to the 10-20 International system of electrode placement. Electrical activity was acquired at a sampling rate of 500Hz  and reviewed with a high frequency filter of 70Hz  and a low frequency filter of 1Hz . EEG data were recorded continuously and digitally stored.   Description: The posterior dominant rhythm consists of 9 Hz activity of moderate voltage (25-35 uV) seen predominantly in posterior head regions, symmetric and reactive to eye opening and eye closing. Sleep was characterized by vertex waves, sleep spindles (12 to 14 Hz), maximal frontocentral region.  EEG showed generalized spikes and polyspikes with right frontal predominance, predominantly seen during sleep.  Hyperventilation and photic stimulation were not performed.     ABNORMALITY.  -Spike,generalized and maximal right frontal region  IMPRESSION: This study is consistent with patient's known history of epilepsy.  This EEG pattern could be due to generalized epilepsy.  However focal epilepsy from right frontal region could also have similar appearance. No seizures or were seen throughout the recording.     Cayman Brogden 

## 2021-08-22 NOTE — Progress Notes (Signed)
Late entry: Photic and HV along with EEG maint complete. No skin breakdown. Pt tolerated activations. Continue to monitor

## 2021-08-22 NOTE — Progress Notes (Addendum)
Patient was up at sink with help of NT and mother brushing her teeth. When she was headed back to the bed, she started seizing (on EEG recording) and fell. No injury noted. Dr. Roda Shutters and NP at bedside immediately after assessing patient while nurse (me) went to grab ativan. Upon entering room, pt was alert and answering all of Dr. Warren Danes questions. Dr. Melynda Ripple paged, came to bedside immediately and assessed patient, instructed me to give ativan once patient back in bed. Seizure lasting less than 5 mins.

## 2021-08-22 NOTE — Progress Notes (Signed)
Seizure precautions maintained during the shift. No seizure activity noted at this time.

## 2021-08-22 NOTE — Progress Notes (Addendum)
Subjective: Did not have any seizures overnight.  However this morning patient got up to brush and had a seizure.  During the seizure, patient went and counterclockwise direction couple of times followed by fall, had left upper extremity extended, right upper extremity placed in a figure-of-four position followed by turning in right lateral position and generalized tonic-clonic activity.  Patient also had tongue bite on the left side of tongue  ROS: negative except above  Examination  Vital signs in last 24 hours: Temp:  [97.5 F (36.4 C)-98.3 F (36.8 C)] 97.8 F (36.6 C) (02/21 0722) Pulse Rate:  [87-158] 117 (02/21 1213) Resp:  [14-26] 17 (02/21 1213) BP: (105-115)/(66-83) 115/83 (02/21 1213) SpO2:  [91 %-99 %] 98 % (02/21 1213)  General: lying in bed, not in apparent distress CVS: pulse-normal rate and rhythm RS: breathing comfortably, CTAB Extremities: Warm, no edema  Neuro: MS: Alert, oriented, follows commands CN: pupils equal and reactive,  EOMI, face symmetric, tongue midline, normal sensation over face, Motor: 5/5 strength in all 4 extremities Reflexes: 2+ bilaterally over patella, biceps, plantars: flexor Coordination: normal Gait: not tested  Basic Metabolic Panel: Recent Labs  Lab 08/21/21 1004  NA 139  K 4.2  CL 105  CO2 25  GLUCOSE 84  BUN 10  CREATININE 0.95  CALCIUM 9.2  MG 2.3  PHOS 2.6    CBC: Recent Labs  Lab 08/21/21 1004  WBC 7.0  NEUTROABS 4.2  HGB 14.2  HCT 42.4  MCV 89.5  PLT 290     Coagulation Studies: Recent Labs    08/21/21 1004  LABPROT 13.4  INR 1.0    Imaging No new brain imaging overnight  ASSESSMENT AND PLAN:  26 year old female with history of epilepsy who is admitted to epilepsy monitoring unit for characterization of spells.   Epilepsy - continue video EEG monitoring for characterization of spells  -We held patient's lamotrigine this morning.  However she had a seizure around 11 and therefore will give IV  Keppra 1000 mg once and administer lamotrigine 50 mg tonight -Of note the seizure was captured is not the new type of event she is having at home per mother.  This is similar to the seizure she has had in the past.  I discussed with mother the possibility of coexisting epilepsy and PNES -seizure precautions -As needed IV Ativan 2 mg for clinical seizure-like activity  Fall -We will obtain CT head without contrast to look for any acute abnormality  Tongue bite -Left-sided tongue bite during seizure.  Lidocaine gel ordered   Depression -Continue home medications   Anxiety -Continue Vistaril as needed   Insomnia - Patient reports trouble sleeping when she is at a new place and states melatonin has not helped in the past -Continue Benadryl as needed   Tremors - Patient reports intermittent tremors.  Normal TSH -Can consider further work-up as outpatient if needed  I have spent a total of 60 minutes with the patient reviewing hospital notes,  test results, labs and examining the patient as well as establishing an assessment and plan that was discussed personally with the patient and mother at bedside.  I also attended to patient when she had her seizure, discussed my assessment with patient and mother at bedside, formulated a plan and discussed the plan with patient, family and RN.  50% of time was spent in direct patient care.   Lindie Spruce Epilepsy [facility] For questions after 5pm please refer to AMION to reach the Neurologist on  call

## 2021-08-22 NOTE — Plan of Care (Signed)
°  Problem: Education: Goal: Expressions of having a comfortable level of knowledge regarding the disease process will increase Outcome: Progressing   Problem: Coping: Goal: Ability to adjust to condition or change in health will improve Outcome: Progressing   Problem: Safety: Goal: Verbalization of understanding the information provided will improve Outcome: Progressing

## 2021-08-23 NOTE — Progress Notes (Addendum)
Subjective: No further seizures overnight.  ROS: negative except above  Examination  Vital signs in last 24 hours: Temp:  [97.7 F (36.5 C)-98 F (36.7 C)] 98 F (36.7 C) (02/22 1125) Pulse Rate:  [93-130] 106 (02/22 1125) Resp:  [14-20] 19 (02/22 1125) BP: (92-124)/(60-84) 124/77 (02/22 1125) SpO2:  [93 %-100 %] 100 % (02/22 1125)  General: lying in bed, NAD CVS: pulse-normal rate and rhythm RS: breathing comfortably Extremities: normal  Neuro: AOx3   Basic Metabolic Panel: Recent Labs  Lab 08/21/21 1004  NA 139  K 4.2  CL 105  CO2 25  GLUCOSE 84  BUN 10  CREATININE 0.95  CALCIUM 9.2  MG 2.3  PHOS 2.6    CBC: Recent Labs  Lab 08/21/21 1004  WBC 7.0  NEUTROABS 4.2  HGB 14.2  HCT 42.4  MCV 89.5  PLT 290     Coagulation Studies: Recent Labs    08/21/21 1004  LABPROT 13.4  INR 1.0    Imaging No new brain imaging overnight  ASSESSMENT AND PLAN: 26 year old female with history of epilepsy who is admitted to epilepsy monitoring unit for characterization of spells.   Epilepsy - continue video EEG monitoring for characterization of spells because of seizure we captured was not a new type of event she has been having at home per mother. -Continue lamotrigine 50 mg twice daily -Per mother, the event yesterday was similar to the seizure she has had in the past.  I discussed with mother the possibility of coexisting epilepsy and PNES -seizure precautions -As needed IV Ativan 2 mg for clinical seizure-like activity   Tongue bite -Left-sided tongue bite during seizure.  Lidocaine gel as needed   Depression -Continue home medications   Anxiety -Continue Vistaril as needed   Insomnia - Patient reports trouble sleeping when she is at a new place and states melatonin has not helped in the past -Continue Benadryl as needed   Tremors - Patient reports intermittent tremors.  Normal TSH -Can consider further work-up as outpatient if needed  I have  spent a total of   36 minutes with the patient reviewing hospital notes,  test results, labs and examining the patient as well as establishing an assessment and plan that was discussed personally with the patient, patient's mother at bedside.  > 50% of time was spent in direct patient care.       Lindie Spruce Epilepsy [facility] For questions after 5pm please refer to AMION to reach the Neurologist on call

## 2021-08-23 NOTE — Plan of Care (Signed)
°  Problem: Education: Goal: Expressions of having a comfortable level of knowledge regarding the disease process will increase Outcome: Progressing   Problem: Safety: Goal: Verbalization of understanding the information provided will improve 08/23/2021 0649 by Nancie Neas, RN Outcome: Progressing 08/23/2021 0647 by Nancie Neas, RN Outcome: Progressing

## 2021-08-23 NOTE — Procedures (Signed)
Patient Name: Maria Day  MRN: [id number]  Epilepsy Attending: Lora Havens  Referring Physician/Provider: Dr Zeb Comfort Duration: 08/22/2021 [id number] to 08/23/2021 [id number]   Patient history: 26 year old female with history of epilepsy now with different semiology of spells admitted for characterization.   Level of alertness: Awake, asleep   AEDs during EEG study: LTG, LEV   Technical aspects: This EEG study was done with scalp electrodes positioned according to the 10-20 International system of electrode placement. Electrical activity was acquired at a sampling rate of 500Hz  and reviewed with a high frequency filter of 70Hz  and a low frequency filter of 1Hz . EEG data were recorded continuously and digitally stored.    Description: The posterior dominant rhythm consists of 9 Hz activity of moderate voltage (25-35 uV) seen predominantly in posterior head regions, symmetric and reactive to eye opening and eye closing. Sleep was characterized by vertex waves, sleep spindles (12 to 14 Hz), maximal frontocentral region.  EEG showed generalized spikes and polyspikes with right frontal predominance, predominantly seen during sleep.   One seizure was noted on 08/22/2021 at 1110.  Patient was outside camera view at the beginning of the seizure.  Per nursing tech she was standing at the sink after coming out from the bathroom.  About 1 minute into the seizure, patient was noted to come into camera view from behind the curtain.  She then started spinning in counterclockwise direction, had left arm elevation and subtle jerking, eyes looking up and possibly on the left side (difficult to review as patient was spinning) followed by falling on the floor.  On the floor, patient was noted to have left upper and lower extremity extension, right upper extremity was flexed and adductor and lower extremity was flexed and abducted.  This was followed by figure-of-four posturing with left upper extremity extension and  right upper extremity flexion.  Patient then turned to right lateral position and had generalized tonic-clonic seizure-like activity.  After the seizure patient was slow to respond but was able to answer questions.  EEG at the onset of seizure showed generalized and maximal right frontal spike followed by sharply contoured 6 to 7 Hz theta activity which appeared generalized and maximal in right hemisphere.  EEG then evolved to show generalized spike and wave admixed with 3 to 5 Hz theta-delta slowing.  Seizure ended at 1112.  Physiologic photic driving was noted during photic stimulation.  No EEG change was seen during hyperventilation.      ABNORMALITY -Seizure, generalized and maximal right frontal region -Spike,generalized and maximal right frontal region   IMPRESSION: This study is consistent with patient's known history of epilepsy.  This EEG pattern could be due to generalized epilepsy.  However focal epilepsy from right frontal region could also have similar appearance.   One seizure was noted on 08/22/2021 at 1110 as described above with generalized and maximal right frontal onset.  Seizure lasted for about 2 minutes.  Laryssa Hassing Barbra Sarks

## 2021-08-23 NOTE — Progress Notes (Signed)
No seizure events noted during the shift. Complained headache last night, pain reliever given afterwards she was resting comfortably.

## 2021-08-23 NOTE — Plan of Care (Signed)
Pt had tremors this morning when she was using her hands. Bath was given. Possible discharge tomorrow. Pt was emotional about that but she is okay now. Mom at bedside.  Problem: Education: Goal: Expressions of having a comfortable level of knowledge regarding the disease process will increase Outcome: Progressing   Problem: Coping: Goal: Ability to adjust to condition or change in health will improve Outcome: Progressing Goal: Ability to identify appropriate support needs will improve Outcome: Progressing   Problem: Health Behavior/Discharge Planning: Goal: Compliance with prescribed medication regimen will improve Outcome: Progressing   Problem: Medication: Goal: Risk for medication side effects will decrease Outcome: Progressing   Problem: Clinical Measurements: Goal: Complications related to the disease process, condition or treatment will be avoided or minimized Outcome: Progressing Goal: Diagnostic test results will improve Outcome: Progressing   Problem: Safety: Goal: Verbalization of understanding the information provided will improve Outcome: Progressing   Problem: Self-Concept: Goal: Level of anxiety will decrease Outcome: Progressing Goal: Ability to verbalize feelings about condition will improve Outcome: Progressing   Problem: Education: Goal: Knowledge of General Education information will improve Description: Including pain rating scale, medication(s)/side effects and non-pharmacologic comfort measures Outcome: Progressing   Problem: Health Behavior/Discharge Planning: Goal: Ability to manage health-related needs will improve Outcome: Progressing   Problem: Clinical Measurements: Goal: Ability to maintain clinical measurements within normal limits will improve Outcome: Progressing Goal: Will remain free from infection Outcome: Progressing Goal: Diagnostic test results will improve Outcome: Progressing Goal: Respiratory complications will  improve Outcome: Progressing   Problem: Activity: Goal: Risk for activity intolerance will decrease Outcome: Progressing   Problem: Coping: Goal: Level of anxiety will decrease Outcome: Progressing   Problem: Pain Managment: Goal: General experience of comfort will improve Outcome: Progressing   Problem: Safety: Goal: Ability to remain free from injury will improve Outcome: Progressing

## 2021-08-24 MED ORDER — FLUOXETINE HCL 20 MG PO CAPS
60.0000 mg | ORAL_CAPSULE | Freq: Every day | ORAL | Status: DC
  Administered 2021-08-24 – 2021-08-25 (×2): 60 mg via ORAL
  Filled 2021-08-24: qty 3

## 2021-08-24 MED ORDER — LEVETIRACETAM 500 MG PO TABS
1000.0000 mg | ORAL_TABLET | Freq: Two times a day (BID) | ORAL | Status: DC
  Administered 2021-08-24 – 2021-08-25 (×3): 1000 mg via ORAL
  Filled 2021-08-24 (×3): qty 2

## 2021-08-24 MED ORDER — CLOBAZAM 10 MG PO TABS
5.0000 mg | ORAL_TABLET | Freq: Every day | ORAL | Status: DC
  Administered 2021-08-24 – 2021-08-25 (×2): 5 mg via ORAL
  Filled 2021-08-24 (×2): qty 1

## 2021-08-24 MED ORDER — LAMOTRIGINE 25 MG PO TABS
125.0000 mg | ORAL_TABLET | Freq: Two times a day (BID) | ORAL | Status: DC
  Administered 2021-08-24 – 2021-08-25 (×3): 125 mg via ORAL
  Filled 2021-08-24 (×3): qty 1

## 2021-08-24 NOTE — Progress Notes (Signed)
Patient had witnessed seizure by mother, myself and EEG tech. EEG running, button pressed and last approx 2 minutes. Blood noted in mouth as patient did bite her tongue.Dr Melynda Ripple up promptly in postictal phase lasting 2 minutes and patient then returned to baseline answering questions appropriately. Seizure meds ordered and given promptly.

## 2021-08-24 NOTE — Plan of Care (Signed)
°  Problem: Education: Goal: Expressions of having a comfortable level of knowledge regarding the disease process will increase Outcome: Progressing   Problem: Coping: Goal: Ability to adjust to condition or change in health will improve Outcome: Progressing Goal: Ability to identify appropriate support needs will improve Outcome: Progressing   Problem: Health Behavior/Discharge Planning: Goal: Compliance with prescribed medication regimen will improve Outcome: Progressing   Problem: Medication: Goal: Risk for medication side effects will decrease Outcome: Progressing   Problem: Clinical Measurements: Goal: Complications related to the disease process, condition or treatment will be avoided or minimized Outcome: Progressing Goal: Diagnostic test results will improve Outcome: Progressing   Problem: Safety: Goal: Verbalization of understanding the information provided will improve Outcome: Progressing   Problem: Self-Concept: Goal: Level of anxiety will decrease Outcome: Progressing Goal: Ability to verbalize feelings about condition will improve Outcome: Progressing   Problem: Education: Goal: Knowledge of General Education information will improve Description: Including pain rating scale, medication(s)/side effects and non-pharmacologic comfort measures Outcome: Progressing   Problem: Health Behavior/Discharge Planning: Goal: Ability to manage health-related needs will improve Outcome: Progressing   Problem: Clinical Measurements: Goal: Ability to maintain clinical measurements within normal limits will improve Outcome: Progressing Goal: Will remain free from infection Outcome: Progressing Goal: Diagnostic test results will improve Outcome: Progressing Goal: Respiratory complications will improve Outcome: Progressing   Problem: Activity: Goal: Risk for activity intolerance will decrease Outcome: Progressing   Problem: Coping: Goal: Level of anxiety will  decrease Outcome: Progressing   Problem: Pain Managment: Goal: General experience of comfort will improve Outcome: Progressing   Problem: Safety: Goal: Ability to remain free from injury will improve Outcome: Progressing

## 2021-08-24 NOTE — Progress Notes (Addendum)
Subjective: Had 1 epileptic seizure this morning.  No new concerns.  ROS: negative except above  Examination  Vital signs in last 24 hours: Temp:  [97.4 F (36.3 C)-98.5 F (36.9 C)] 98.5 F (36.9 C) (02/23 1205) Pulse Rate:  [77-100] 100 (02/23 1205) Resp:  [12-19] 19 (02/23 1205) BP: (98-114)/(55-82) 103/58 (02/23 1205) SpO2:  [96 %-97 %] 97 % (02/23 1205)  General: lying in bed, NAD CVS: pulse-normal rate and rhythm RS: breathing comfortably Extremities: normal  Neuro: AOx3  Basic Metabolic Panel: Recent Labs  Lab 08/21/21 1004  NA 139  K 4.2  CL 105  CO2 25  GLUCOSE 84  BUN 10  CREATININE 0.95  CALCIUM 9.2  MG 2.3  PHOS 2.6    CBC: Recent Labs  Lab 08/21/21 1004  WBC 7.0  NEUTROABS 4.2  HGB 14.2  HCT 42.4  MCV 89.5  PLT 290     Coagulation Studies: No results for input(s): LABPROT, INR in the last 72 hours.  Imaging No new brain imaging overnight   ASSESSMENT AND PLAN: 26 year old female with history of epilepsy who is admitted to epilepsy monitoring unit for characterization of spells.   Epilepsy - continue video EEG monitoring for characterization of spells because of seizure we captured was not a new type of event she has been having at home per mother. -Per mother, the events we captured are similar to the seizure she has had in the past.  I discussed with mother the possibility of coexisting epilepsy and PNES -Resume home dose of lamotrigine 125 mg twice daily, Keppra 1000 mg twice daily and start Onfi 5 mg daily. -Patient has been reporting significant anxiety after losing her brother and knowing about her father's cancer diagnosis.  She also reports significant weight gain on Keppra.  Keppra can worsen irritability.  Therefore will  slowly switch from Keppra to [facility] -seizure precautions -As needed IV Ativan 2 mg for clinical seizure-like activity   Tongue bite -Left-sided tongue bite during seizure.  Lidocaine gel as needed    Depression -Continue home medications   Anxiety -Continue Vistaril as needed   Insomnia - Patient reports trouble sleeping when she is at a new place and states melatonin has not helped in the past -Continue Benadryl as needed   Tremors - Patient reports intermittent tremors.  Normal TSH -Can consider further work-up as outpatient if needed    I have spent a total of 40 minutes with the patient reviewing hospital notes,  test results, labs and examining the patient as well as establishing an assessment and plan that was discussed personally with the patient and mother at bedside.  I also attended to patient when she had her seizure, discussed my assessment with patient and mother at bedside, formulated a plan and discussed the plan with patient, family and RN.  50% of time was spent in direct patient care.    Lindie Spruce Epilepsy [facility] For questions after 5pm please refer to AMION to reach the Neurologist on call

## 2021-08-24 NOTE — Progress Notes (Signed)
LTM maintenance. No skin breakdown noted. Frontal leads checked.

## 2021-08-24 NOTE — Procedures (Signed)
Patient Name: Maria Day  MRN: [id number]  Epilepsy Attending: Charlsie Quest  Referring Physician/Provider: Dr Lindie Spruce Duration: 08/23/2021 2395 to 08/23/2021 3202   Patient history: 26 year old female with history of epilepsy now with different semiology of spells admitted for characterization.   Level of alertness: Awake, asleep   AEDs during EEG study: LTG, LEV   Technical aspects: This EEG study was done with scalp electrodes positioned according to the 10-20 International system of electrode placement. Electrical activity was acquired at a sampling rate of 500Hz  and reviewed with a high frequency filter of 70Hz  and a low frequency filter of 1Hz . EEG data were recorded continuously and digitally stored.    Description: The posterior dominant rhythm consists of 9 Hz activity of moderate voltage (25-35 uV) seen predominantly in posterior head regions, symmetric and reactive to eye opening and eye closing. Sleep was characterized by vertex waves, sleep spindles (12 to 14 Hz), maximal frontocentral region.  EEG showed generalized spikes and polyspikes with right frontal predominance, predominantly seen during sleep.      ABNORMALITY -Seizure, generalized and maximal right frontal region -Spike,generalized and maximal right frontal region   IMPRESSION: This study is consistent with patient's known history of epilepsy.  This EEG pattern could be due to generalized epilepsy.  However focal epilepsy from right frontal region could also have similar appearance. No seizures were seen during this study.    Maria Day 

## 2021-08-25 ENCOUNTER — Encounter: Payer: Self-pay | Admitting: Neurology

## 2021-08-25 ENCOUNTER — Other Ambulatory Visit (HOSPITAL_COMMUNITY): Payer: Self-pay

## 2021-08-25 MED ORDER — CLOBAZAM 10 MG PO TABS
5.0000 mg | ORAL_TABLET | Freq: Every day | ORAL | 2 refills | Status: DC
  Filled 2021-08-25: qty 30, 60d supply, fill #0

## 2021-08-25 MED ORDER — CLOBAZAM 10 MG PO TABS: 5.0000 mg | ORAL_TABLET | Freq: Every day | ORAL | 2 refills | Status: DC

## 2021-08-25 NOTE — Discharge Instructions (Addendum)
You were admitted to epilepsy monitoring unit.  We captured 2 events which were epileptic seizures.  However, we were unable to capture the new events you have been having for the last few months.  We discussed with the mother about taking a video when these events happen at home.  We also discussed the possibility of nonepileptic events coexisting with epilepsy.  He reported significant stress and therefore we recommend follow-up with psychiatry.  Continue lamotrigine and Keppra at home dose, start Onfi 5 mg daily.  Recommend follow-up with Dr. Teresa Coombs.  Goal will be to eventually transition you off of Keppra and increase Onfi to help with mood stabilization.  Continue seizure precautions.

## 2021-08-25 NOTE — Progress Notes (Signed)
Discontinued cEEG study.  Notified [facility] monitoring.  Removed EEG electrodes.  Mild skin breakdown at Fpo1, Fp2, A1.

## 2021-08-25 NOTE — Procedures (Addendum)
Patient Name: Maria Day  MRN: [id number]  Epilepsy Attending: Charlsie Quest  Referring Physician/Provider: Dr Lindie Spruce Duration: 08/24/2021 2119 to 08/25/2021 4174   Patient history: 26 year old female with history of epilepsy now with different semiology of spells admitted for characterization.   Level of alertness: Awake, asleep   AEDs during EEG study: LTG, LEV, Onfi   Technical aspects: This EEG study was done with scalp electrodes positioned according to the 10-20 International system of electrode placement. Electrical activity was acquired at a sampling rate of 500Hz  and reviewed with a high frequency filter of 70Hz  and a low frequency filter of 1Hz . EEG data were recorded continuously and digitally stored.    Description: The posterior dominant rhythm consists of 9 Hz activity of moderate voltage (25-35 uV) seen predominantly in posterior head regions, symmetric and reactive to eye opening and eye closing. Sleep was characterized by vertex waves, sleep spindles (12 to 14 Hz), maximal frontocentral region.  EEG showed generalized spikes and polyspikes with right frontal predominance, predominantly seen during sleep.    One seizure was noted on 08/24/2021 at 0942.  Patient was sitting in bed talking to nurse.  After about 15 seconds of EEG seizure she had left upper extremity flexed, elevated and abducted.  This was associated with patient turning to the left side and forced left gaze deviation.  Patient was holding something in the right arm which was flexed and adductor.  This was followed by figure-of-four posturing with left upper extremity extension and right upper extremity flexion.  Patient then turned to right lateral position and had generalized tonic-clonic seizure-like activity.  After the seizure patient was slow to respond but was able to answer questions.  EEG at the onset of seizure showed generalized and maximal right frontal spike followed by sharply contoured 6 to 7  Hz theta activity which appeared generalized and maximal in right hemisphere.  EEG then evolved to show generalized spike and wave admixed with 3 to 5 Hz theta-delta slowing. Seizure ended at 0943.  After the end of seizure, EEG showed continuous generalized background attenuation.   ABNORMALITY -Seizure, generalized and maximal right frontal region -Spike,generalized and maximal right frontal region   IMPRESSION: This study is consistent with patient's known history of epilepsy.  This EEG pattern could be due to generalized epilepsy.  However focal epilepsy from right frontal region could also have similar appearance.    One seizure was noted on 08/24/2021 at 0942 as described above with generalized and maximal right frontal onset.  Seizure lasted for about 1.5 minutes.  Amaad Byers 

## 2021-08-25 NOTE — Discharge Summary (Addendum)
Physician Discharge Summary  Patient ID: Maria Day MRN: [id number] DOB/AGE: 24-01-1996 24 y.o.  Admit date: 08/21/2021 Discharge date: 08/25/2021  Admission Diagnoses: Seizure  Discharge Diagnoses: Epilepsy  Discharged Condition: stable  Hospital Course: Maria Day was admitted to epilepsy monitoring unit from 08/21/2021 to 08/25/2021.  During this.,  Patient went continuous video EEG monitoring, had hyperventilation, photic stimulation as well as her antiepileptics were discontinued.  We captured 2 events which were consistent with seizures, with generalized onset.  However, focal epilepsy from right frontal region could also have similar appearance.  Unfortunately, we were unable to capture the new events patient has had during which patient does not feel well, comes to her mother and then has full body shaking.  It is possible that patient has coexisting nonepileptic events.  Discussed with mother about taking a video of events in future if able to. Patient also reported significant stress.  Therefore we recommend continuing home dose of lamotrigine and Keppra and started Onfi 5 mg daily with plan to gradually wean off Keppra and increase Onfi to help with mood stabilization.  We also recommended follow-up with psychiatry for anxiety and depression.  Discussed seizure precautions including do not drive.  Recommend follow-up with Dr. Teresa Coombs.  Seizure precautions: Per [facility] statutes, patients with seizures are not allowed to drive until they have been seizure-free for six months and cleared by a physician    Use caution when using heavy equipment or power tools. Avoid working on ladders or at heights. Take showers instead of baths. Ensure the water temperature is not too high on the home water heater. Do not go swimming alone. Do not lock yourself in a room alone (i.e. bathroom). When caring for infants or small children, sit down when holding, feeding, or changing them to minimize  risk of injury to the child in the event you have a seizure. Maintain good sleep hygiene. Avoid alcohol.    If patient has another seizure, call 911 and bring them back to the ED if: A.  The seizure lasts longer than 5 minutes.      B.  The patient doesn't wake shortly after the seizure or has new problems such as difficulty seeing, speaking or moving following the seizure C.  The patient was injured during the seizure D.  The patient has a temperature over 102 F (39C) E.  The patient vomited during the seizure and now is having trouble breathing    During the Seizure   - First, ensure adequate ventilation and place patients on the floor on their left side  Loosen clothing around the neck and ensure the airway is patent. If the patient is clenching the teeth, do not force the mouth open with any object as this can cause severe damage - Remove all items from the surrounding that can be hazardous. The patient may be oblivious to what's happening and may not even know what he or she is doing. If the patient is confused and wandering, either gently guide him/her away and block access to outside areas - Reassure the individual and be comforting - Call 911. In most cases, the seizure ends before EMS arrives. However, there are cases when seizures may last over 3 to 5 minutes. Or the individual may have developed breathing difficulties or severe injuries. If a pregnant patient or a person with diabetes develops a seizure, it is prudent to call an ambulance. - Finally, if the patient does not regain full consciousness, then call EMS.  Most patients will remain confused for about 45 to 90 minutes after a seizure, so you must use judgment in calling for help. - Avoid restraints but make sure the patient is in a bed with padded side rails - Place the individual in a lateral position with the neck slightly flexed; this will help the saliva drain from the mouth and prevent the tongue from falling backward -  Remove all nearby furniture and other hazards from the area - Provide verbal assurance as the individual is regaining consciousness - Provide the patient with privacy if possible - Call for help and start treatment as ordered by the caregiver    After the Seizure (Postictal Stage)   After a seizure, most patients experience confusion, fatigue, muscle pain and/or a headache. Thus, one should permit the individual to sleep. For the next few days, reassurance is essential. Being calm and helping reorient the person is also of importance.   Most seizures are painless and end spontaneously. Seizures are not harmful to others but can lead to complications such as stress on the lungs, brain and the heart. Individuals with prior lung problems may develop labored breathing and respiratory distress.    Consults: None  Significant Diagnostic Studies:   Description: The posterior dominant rhythm consists of 9 Hz activity of moderate voltage (25-35 uV) seen predominantly in posterior head regions, symmetric and reactive to eye opening and eye closing. Sleep was characterized by vertex waves, sleep spindles (12 to 14 Hz), maximal frontocentral region.  EEG showed generalized spikes and polyspikes with right frontal predominance, predominantly seen during sleep.    One seizure was noted on 08/22/2021 at 1110.  Patient was outside camera view at the beginning of the seizure.  Per nursing tech she was standing at the sink after coming out from the bathroom.  About 1 minute into the seizure, patient was noted to come into camera view from behind the curtain.  She then started spinning in counterclockwise direction, had left arm elevation and subtle jerking, eyes looking up and possibly on the left side (difficult to review as patient was spinning) followed by falling on the floor.  On the floor, patient was noted to have left upper and lower extremity extension, right upper extremity was flexed and adductor and lower  extremity was flexed and abducted.  This was followed by figure-of-four posturing with left upper extremity extension and right upper extremity flexion.  Patient then turned to right lateral position and had generalized tonic-clonic seizure-like activity.  After the seizure patient was slow to respond but was able to answer questions.  EEG at the onset of seizure showed generalized and maximal right frontal spike followed by sharply contoured 6 to 7 Hz theta activity which appeared generalized and maximal in right hemisphere.  EEG then evolved to show generalized spike and wave admixed with 3 to 5 Hz theta-delta slowing.  Seizure ended at 1112.   Physiologic photic driving was noted during photic stimulation.  No EEG change was seen during hyperventilation.      ABNORMALITY -Seizure, generalized and maximal right frontal region -Spike,generalized and maximal right frontal region   IMPRESSION: This study is consistent with patient's known history of epilepsy.  This EEG pattern could be due to generalized epilepsy.  However focal epilepsy from right frontal region could also have similar appearance.    One seizure was noted on 08/22/2021 at 1110 as described above with generalized and maximal right frontal onset.  Seizure lasted for about 2 minutes.  Treatments: Continue lamotrigine 125 mg twice daily, Keppra 1000 mg twice daily, start Onfi 5 mg daily  Discharge Exam: Blood pressure 117/84, pulse 98, temperature 98.7 F (37.1 C), temperature source Oral, resp. rate 14, height 5' (1.524 m), weight 80.3 kg, SpO2 98 %. General: lying in bed, NAD CVS: pulse-normal rate and rhythm RS: breathing comfortably Extremities: normal  Neuro: AOx3  Disposition: Discharge disposition: 01-Home or Self Care     Home  Discharge Instructions     Diet - low sodium heart healthy   Complete by: As directed    Driving Restrictions   Complete by: As directed    [place] [facility] [place], patients  with seizures are not allowed to drive until they have been seizure-free for six months and cleared by a physician   Other Restrictions   Complete by: As directed    Seizure precautions:   Use caution when using heavy equipment or power tools. Avoid working on ladders or at heights. Take showers instead of baths. Ensure the water temperature is not too high on the home water heater. Do not go swimming alone. Do not lock yourself in a room alone (i.e. bathroom). When caring for infants or small children, sit down when holding, feeding, or changing them to minimize risk of injury to the child in the event you have a seizure. Maintain good sleep hygiene. Avoid alcohol.    If patient has another seizure, call 911 and bring them back to the ED if: A.  The seizure lasts longer than 5 minutes.      B.  The patient doesn't wake shortly after the seizure or has new problems such as difficulty seeing, speaking or moving following the seizure C.  The patient was injured during the seizure D.  The patient has a temperature over 102 F (39C) E.  The patient vomited during the seizure and now is having trouble breathing    During the Seizure   - First, ensure adequate ventilation and place patients on the floor on their left side  Loosen clothing around the neck and ensure the airway is patent. If the patient is clenching the teeth, do not force the mouth open with any object as this can cause severe damage - Remove all items from the surrounding that can be hazardous. The patient may be oblivious to what's happening and may not even know what he or she is doing. If the patient is confused and wandering, either gently guide him/her away and block access to outside areas - Reassure the individual and be comforting - Call 911. In most cases, the seizure ends before EMS arrives. However, there are cases when seizures may last over 3 to 5 minutes. Or the individual may have developed breathing difficulties or  severe injuries. If a pregnant patient or a person with diabetes develops a seizure, it is prudent to call an ambulance. - Finally, if the patient does not regain full consciousness, then call EMS. Most patients will remain confused for about 45 to 90 minutes after a seizure, so you must use judgment in calling for help.    After the Seizure (Postictal Stage)   After a seizure, most patients experience confusion, fatigue, muscle pain and/or a headache. Thus, one should permit the individual to sleep. For the next few days, reassurance is essential. Being calm and helping reorient the person is also of importance.   Most seizures are painless and end spontaneously. Seizures are not harmful to others but can  lead to complications such as stress on the lungs, brain and the heart. Individuals with prior lung problems may develop labored breathing and respiratory distress.      Allergies as of 08/25/2021       Reactions   Penicillins Rash   Amoxicillin Rash        Medication List     TAKE these medications    cloBAZam 10 MG tablet Commonly known as: ONFI Take 0.5 tablets (5 mg total) by mouth daily. Start taking on: August 26, 2021   FLUoxetine HCl 60 MG Tabs Take 1 tablet at bedtime What changed:  how much to take how to take this when to take this additional instructions   lamoTRIgine 100 MG tablet Commonly known as: LAMICTAL Take 100 mg by mouth See admin instructions. Take one tablet by mouth in the morning, then take 2 tablets by mouth in the evening   lamoTRIgine 25 MG tablet Commonly known as: LAMICTAL Take 1 tablet (25 mg total) by mouth daily.   levETIRAcetam 1000 MG tablet Commonly known as: KEPPRA Take 1 tablet (1,000 mg total) by mouth 2 (two) times daily.       I have spent a total of  40  minutes with the patient reviewing hospital notes,  test results, labs and examining the patient as well as establishing an assessment and plan that was discussed  personally with the patient.  > 50% of time was spent in direct patient care.     Signed: Charlsie Quest 08/25/2021, 9:32 AM

## 2021-08-25 NOTE — Plan of Care (Signed)
°  Problem: Health Behavior/Discharge Planning: Goal: Compliance with prescribed medication regimen will improve Outcome: Progressing   Problem: Safety: Goal: Verbalization of understanding the information provided will improve Outcome: Progressing   Problem: Self-Concept: Goal: Level of anxiety will decrease Outcome: Progressing   Problem: Safety: Goal: Ability to remain free from injury will improve Outcome: Progressing

## 2021-08-25 NOTE — Plan of Care (Signed)
Pt is doing well. No concerns over night.  Problem: Education: Goal: Expressions of having a comfortable level of knowledge regarding the disease process will increase Outcome: Completed/Met   Problem: Coping: Goal: Ability to adjust to condition or change in health will improve Outcome: Completed/Met Goal: Ability to identify appropriate support needs will improve Outcome: Completed/Met   Problem: Health Behavior/Discharge Planning: Goal: Compliance with prescribed medication regimen will improve Outcome: Completed/Met   Problem: Medication: Goal: Risk for medication side effects will decrease Outcome: Completed/Met   Problem: Clinical Measurements: Goal: Complications related to the disease process, condition or treatment will be avoided or minimized Outcome: Completed/Met Goal: Diagnostic test results will improve Outcome: Completed/Met   Problem: Safety: Goal: Verbalization of understanding the information provided will improve Outcome: Completed/Met   Problem: Self-Concept: Goal: Level of anxiety will decrease Outcome: Completed/Met Goal: Ability to verbalize feelings about condition will improve Outcome: Completed/Met   Problem: Education: Goal: Knowledge of General Education information will improve Description: Including pain rating scale, medication(s)/side effects and non-pharmacologic comfort measures Outcome: Completed/Met   Problem: Health Behavior/Discharge Planning: Goal: Ability to manage health-related needs will improve Outcome: Completed/Met   Problem: Clinical Measurements: Goal: Ability to maintain clinical measurements within normal limits will improve Outcome: Completed/Met Goal: Will remain free from infection Outcome: Completed/Met Goal: Diagnostic test results will improve Outcome: Completed/Met Goal: Respiratory complications will improve Outcome: Completed/Met   Problem: Activity: Goal: Risk for activity intolerance will  decrease Outcome: Completed/Met   Problem: Coping: Goal: Level of anxiety will decrease Outcome: Completed/Met   Problem: Pain Managment: Goal: General experience of comfort will improve Outcome: Completed/Met   Problem: Safety: Goal: Ability to remain free from injury will improve Outcome: Completed/Met

## 2021-08-25 NOTE — Progress Notes (Signed)
Packet and education given to patient and mom. Letter for work given to patient. IV taken out. TOC brought meds. Scripts given to pt. No questions or concerns.

## 2021-08-25 NOTE — TOC Transition Note (Signed)
Transition of Care [facility]) - CM/SW Discharge Note   Patient Details  Name: Maria Day MRN: [id number] Date of Birth: July 11, 1995  [facility] CM/SW Contact:  Kermit Balo, RN Phone Number: 08/25/2021, 10:20 AM   Clinical Narrative:    Patient is discharging home with self care. No needs per TOC.    Final next level of care: Home/Self Care Barriers to Discharge: No Barriers Identified   Patient Goals and CMS Choice        Discharge Placement                       Discharge Plan and Services                                     Social Determinants of Health (SDOH) Interventions     Readmission Risk Interventions No flowsheet data found.

## 2021-08-25 NOTE — Progress Notes (Signed)
° °  [facility] [id number]     Date: 08/25/2021   Re: Return work   MS Michelene Heady underwent medical evaluation at [facility] from 08/21/2021 to 08/25/2021 for evaluation of seizures. She is undergoing treatment for this and can safely return to work with precautions as noted below.    Precautions:   Use caution when using heavy equipment or power tools. Avoid working on ladders or at heights. Do not lock yourself in a room alone (i.e. bathroom). Maintain good sleep hygiene. Avoid alcohol.    If patient has another event at work, call 911 if: A.  The event lasts longer than 5 minutes.      B.  The patient doesn't wake up shortly after the event or has new problems such as difficulty seeing, speaking or moving following the seizure C.  The patient was injured during the event D.  The patient has a temperature over 102 F (39C) E.  The patient vomited during the event and now is having trouble breathing    During the Event   - First, ensure adequate ventilation and place patients on the floor on their side with the neck slightly flexed; this will help the saliva drain from the mouth and prevent the tongue from falling backward.  Avoid restraints -Loosen clothing around the neck and ensure the airway is patent. If the patient is clenching the teeth, do not force the mouth open with any object as this can cause severe damage - Remove all nearby furniture and other hazards from the area. The patient may be oblivious to what's happening and may not even know what he or she is doing. - Provide verbal assurance as the individual is regaining consciousness -If the patient is confused and wandering, either gently guide him/her away and block access to outside areas - If the patient does not regain full consciousness, then call EMS. Most patients will remain confused for about 45 to 90 minutes after a seizure, so you must use judgment  in calling for help.    After the Event   After a event, most patients experience confusion, fatigue, muscle pain and/or a headache. Thus, one should permit the individual to sleep. For the next few days, reassurance is essential. Being calm and helping reorient the person is also of importance.   Please do not hesitate to contact me for any further questions   Jamesyn Moorefield Hortense Ramal

## 2021-08-28 ENCOUNTER — Telehealth: Payer: Self-pay | Admitting: Neurology

## 2021-08-28 ENCOUNTER — Other Ambulatory Visit: Payer: Self-pay | Admitting: Neurology

## 2021-08-28 NOTE — Telephone Encounter (Signed)
At  12:04 pt left vm stating she'd like a call to discuss the time she was in the hospital and the period of time they had her off of her meds.  Pt states she is now back on all her meds but she is having really bad anxiety and fears having another seizure.  Pt wants a call to discuss this, she states she was in the hospital from  Mon-Friday of last week.

## 2021-08-29 ENCOUNTER — Telehealth: Payer: Self-pay | Admitting: Neurology

## 2021-08-29 NOTE — Telephone Encounter (Signed)
Called back patient, went to voicemail, left her a message that I will contact her later.   Dr. Teresa Coombs

## 2021-08-29 NOTE — Telephone Encounter (Signed)
Spoke with patient, all questions answered. I have advised her to take Clobazam at night. Since she is having upset stomach, to decrease dose to half for 2 weeks then increase it to full tab.

## 2021-09-06 ENCOUNTER — Telehealth: Payer: Self-pay | Admitting: Neurology

## 2021-09-06 NOTE — Telephone Encounter (Signed)
Pt's mother is asking for a call with results to recently ran test ?

## 2021-09-06 NOTE — Telephone Encounter (Signed)
Called mother Santina Evans on number provided, [phone]. Unable to leave a message.

## 2021-09-15 NOTE — Telephone Encounter (Signed)
Pt's mother Wynell Halberg called stating that she was not called with the results. I repeated number that is mentioned in the note and she states that it is not her phone number. Please call pt's mother at. [phone] ?

## 2021-09-18 NOTE — Telephone Encounter (Signed)
Pt's mother inquired about results. Asking if can have a call back today. ?

## 2021-09-18 NOTE — Telephone Encounter (Signed)
I called patient's mother, [name], per DPR. She would like the results of the testing done in the hospital under Dr. Candi Leash care. She can be reached at [phone] after 4pm today. ?

## 2021-09-18 NOTE — Telephone Encounter (Signed)
Spoke with mother, all questions answered. I will see her as scheduled on 10/30/2021

## 2021-10-05 ENCOUNTER — Other Ambulatory Visit: Payer: Self-pay | Admitting: Neurology

## 2021-10-05 ENCOUNTER — Other Ambulatory Visit (HOSPITAL_COMMUNITY): Payer: Self-pay

## 2021-10-05 MED ORDER — CLOBAZAM 10 MG PO TABS: 5.0000 mg | ORAL_TABLET | Freq: Every day | ORAL | 5 refills | Status: DC

## 2021-10-05 NOTE — Telephone Encounter (Signed)
Pt is requesting a refill for cloBAZam (ONFI) 10 MG tablet  ? ?[id number]  ? ?

## 2021-10-13 ENCOUNTER — Other Ambulatory Visit: Payer: Self-pay | Admitting: Neurology

## 2021-10-30 ENCOUNTER — Ambulatory Visit: Payer: BLUE CROSS/BLUE SHIELD | Admitting: Neurology

## 2021-11-09 ENCOUNTER — Telehealth: Payer: Self-pay | Admitting: Psychiatry

## 2021-11-09 MED ORDER — LEVETIRACETAM 500 MG PO TABS: 500.0000 mg | ORAL_TABLET | Freq: Two times a day (BID) | ORAL | 3 refills | Status: DC

## 2021-11-09 MED ORDER — LEVETIRACETAM 1000 MG PO TABS: 1000.0000 mg | ORAL_TABLET | Freq: Two times a day (BID) | ORAL | 1 refills | Status: DC

## 2021-11-09 NOTE — Telephone Encounter (Signed)
Received call from patient's mother that the patient had 2 seizures today. She was sitting on the couch when her hand started "twisting" and her lids started twitching. This lasted for about 3 minutes. Had another seizure 5 hours later. The second seizure had generalized shaking and lasted for 5 minutes. She has been taking her medication regularly. She is back to baseline now. Will have her take an extra 500 mg of Keppra tonight (1500 mg total) with the plan to increase to 1500 mg BID. The patient's mother requested that Dr. Teresa Coombs touch base with her tomorrow regarding her AED regimen. Will forward note to Dr. Teresa Coombs for review. ? ?Ocie Doyne ?11/09/21 ?6:37 PM ? ?

## 2021-11-10 ENCOUNTER — Telehealth: Payer: Self-pay | Admitting: Neurology

## 2021-11-10 NOTE — Telephone Encounter (Signed)
Spoke with patient and mother. She had 2 GTC yesterday while on vacation.  ?Recommended them to  ? Increase Keppra to 1500 mg BID  ? Increase Lamotrigine to 200 mg BID  ? Increase Clobazam to 10 mg nightly  ? ?They are comfortable with plans.  ? ?Windell Norfolk, MD ? ? ?

## 2021-11-10 NOTE — Telephone Encounter (Signed)
Thank you. I spoke to the family. Agree with your recommendations.

## 2021-11-13 ENCOUNTER — Other Ambulatory Visit: Payer: Self-pay | Admitting: Neurology

## 2021-11-13 MED ORDER — CLONAZEPAM 1 MG PO TABS: 1.0000 mg | ORAL_TABLET | Freq: Two times a day (BID) | ORAL | 0 refills | Status: DC

## 2021-11-13 NOTE — Telephone Encounter (Signed)
I spoke with patient. Thank you Maria Day.

## 2021-11-13 NOTE — Progress Notes (Signed)
Spoke with patient, reports that she is still not feeling like her normal self after after the 2 seizures that she had last week.  She has increased her medication but she is not back to her normal self.  She reports increase anxiety about having another seizure.  I will give her a 3-day course of Klonopin.  I will also plan to see her in the office this week or later this next week. ? ? ?Dr. Teresa Coombs  ?

## 2021-11-13 NOTE — Telephone Encounter (Signed)
I called patient to discuss. No answer, left a message asking her to call me back. ?

## 2021-11-13 NOTE — Telephone Encounter (Signed)
Pt is asking for a call to discuss all of her seizure medications, she has concerns. ?

## 2021-11-16 MED ORDER — CLOBAZAM 10 MG PO TABS: 10.0000 mg | ORAL_TABLET | Freq: Every day | ORAL | 5 refills | Status: DC

## 2021-11-16 MED ORDER — LEVETIRACETAM 500 MG PO TABS: 500.0000 mg | ORAL_TABLET | Freq: Two times a day (BID) | ORAL | 3 refills | Status: DC

## 2021-11-16 MED ORDER — LAMOTRIGINE 200 MG PO TABS: 200.0000 mg | ORAL_TABLET | Freq: Two times a day (BID) | ORAL | 1 refills | Status: DC

## 2021-11-16 MED ORDER — LEVETIRACETAM 1000 MG PO TABS: 1000.0000 mg | ORAL_TABLET | Freq: Two times a day (BID) | ORAL | 1 refills | Status: DC

## 2021-11-16 MED ORDER — LAMOTRIGINE 200 MG PO TABS
200.0000 mg | ORAL_TABLET | Freq: Two times a day (BID) | ORAL | 1 refills | Status: DC
Start: 2021-11-16 — End: 2022-03-20

## 2021-11-16 NOTE — Addendum Note (Signed)
Addended by: Wyvonnia Lora on: 11/16/2021 01:07 PM   Modules accepted: Orders

## 2021-11-16 NOTE — Telephone Encounter (Signed)
I reviewed pt chart. Appears Dr. Teresa Coombs spoke w/ pt on 11/10/21. Recommended the following:  "Spoke with patient and mother. She had 2 GTC yesterday while on vacation. Recommended them to              Increase Keppra to 1500 mg BID              Increase Lamotrigine to 200 mg BID              Increase Clobazam to 10 mg nightly  They are comfortable with plans. Windell Norfolk, MD"  [facility]

## 2021-11-16 NOTE — Addendum Note (Signed)
Addended by: Wyvonnia Lora on: 11/16/2021 12:11 PM   Modules accepted: Orders

## 2021-11-16 NOTE — Addendum Note (Signed)
Addended byAlric Ran on: 11/16/2021 01:10 PM   Modules accepted: Orders

## 2021-11-16 NOTE — Telephone Encounter (Signed)
Please send those as well to [facility]   [employer]

## 2021-11-16 NOTE — Telephone Encounter (Signed)
Done. Please review

## 2021-11-16 NOTE — Telephone Encounter (Signed)
Pt is asking that for all the medications she has recently been advised to increase the dose on that a [facility] [phone].  Message being sent to POD for Dr Teresa Coombs

## 2021-11-16 NOTE — Telephone Encounter (Signed)
Called pt back. Went over instructions for each medication again:   Increase Keppra to 1500 mg BID              Increase Lamotrigine to 200 mg BID              Increase Clobazam to 10 mg nightly   She verbalized understanding. She will [facility] up [facility].

## 2021-11-16 NOTE — Telephone Encounter (Signed)
Pt called stating that her medication is written wrong. I read instructions to pt and she refused to believe it and requested the RN to call her. Please advise.

## 2021-11-21 NOTE — Telephone Encounter (Signed)
Called pt back. Went over instructions for each medication again:   Increase Keppra to 1500 mg BID              Increase Lamotrigine to 200 mg BID              Increase Clobazam to 10 mg nightly    She compared the instructions to each of the bottles she received from the pharmacy. She verbalized understanding and repeated the directions back to me correctly.

## 2021-11-21 NOTE — Telephone Encounter (Signed)
Pt said have picked up the medications, but have not started taking yet. Was instructed when got medication would need to call back to go over the directions for Keppra, Lamotrigine, Clobazam. Would like a call from the nurse.

## 2021-11-24 DIAGNOSIS — N760 Acute vaginitis: Secondary | ICD-10-CM | POA: Diagnosis not present

## 2021-11-24 DIAGNOSIS — N898 Other specified noninflammatory disorders of vagina: Secondary | ICD-10-CM | POA: Diagnosis not present

## 2021-11-24 DIAGNOSIS — B9689 Other specified bacterial agents as the cause of diseases classified elsewhere: Secondary | ICD-10-CM | POA: Diagnosis not present

## 2021-11-25 ENCOUNTER — Telehealth: Payer: Self-pay | Admitting: Diagnostic Neuroimaging

## 2021-11-25 NOTE — Telephone Encounter (Signed)
Patient feels like she is going to have more seizures (out of body sensation). She never took the clonazepam x 3 days. Still has 6 tabs. Advised to go ahead and take the clonazepam 1mg  twice a day x 3 days. Continue other meds.    Penni Bombard, MD [id number], [id number] AM Certified in Neurology, Neurophysiology and Neuroimaging  [facility] [address], [place], [place] [address] [phone]

## 2021-11-26 ENCOUNTER — Telehealth: Payer: Self-pay | Admitting: Diagnostic Neuroimaging

## 2021-11-26 NOTE — Telephone Encounter (Signed)
Patient called again today. Tried clonazepam 1mg  last night and this morning, now feeling more out of it and off balance. Advised to hold clonazepam and wait for side effects to resolve. No seizures today. No warning to seizures today.   Also, we updated contact information today (living with godmother; new address and phone number). [facility] sent mychart sign up information.     , MD 11/26/2021, 11:10 AM Certified in Neurology, Neurophysiology and Neuroimaging  [facility] [address] [place], [place] [phone]

## 2021-11-27 ENCOUNTER — Telehealth: Payer: Medicare Other | Admitting: Physician Assistant

## 2021-11-27 DIAGNOSIS — R42 Dizziness and giddiness: Secondary | ICD-10-CM | POA: Diagnosis not present

## 2021-11-27 NOTE — Patient Instructions (Signed)
  Michelene Heady, thank you for joining Rodney Booze, PA-C for today's virtual visit.  While this provider is not your primary care provider (PCP), if your PCP is located in our provider database this encounter information will be shared with them immediately following your visit.  Consent: (Patient) Michelene Heady provided verbal consent for this virtual visit at the beginning of the encounter.  Current Medications:  Current Outpatient Medications:    cloBAZam (ONFI) 10 MG tablet, Take 1 tablet (10 mg total) by mouth daily., Disp: 30 tablet, Rfl: 5   clonazePAM (KLONOPIN) 1 MG tablet, Take 1 tablet (1 mg total) by mouth 2 (two) times daily for 3 days., Disp: 6 tablet, Rfl: 0   FLUoxetine HCl 60 MG TABS, Take 1 tablet at bedtime (Patient taking differently: Take 1 tablet by mouth daily.), Disp: 90 tablet, Rfl: 1   lamoTRIgine (LAMICTAL) 200 MG tablet, Take 1 tablet (200 mg total) by mouth 2 (two) times daily., Disp: 180 tablet, Rfl: 1   levETIRAcetam (KEPPRA) 1000 MG tablet, Take 1 tablet (1,000 mg total) by mouth 2 (two) times daily. Take with 500 mg tablet for a total of 1500 mg twice a day, Disp: 180 tablet, Rfl: 1   levETIRAcetam (KEPPRA) 500 MG tablet, Take 1 tablet (500 mg total) by mouth 2 (two) times daily. Take in addition to 1000 mg tablet for a total of 1500 mg twice a day, Disp: 60 tablet, Rfl: 3   Medications ordered in this encounter:  No orders of the defined types were placed in this encounter.    *If you need refills on other medications prior to your next appointment, please contact your pharmacy*  Follow-Up: You have been instructed to have an in-person evaluation today at a local Urgent Care facility, please use the link below. It will take you to a list of all of our available [facility], including address, phone number and hours of operation. Please do not delay care.  [facility]  If you or a family member do not have a primary care  provider, use the link below to schedule a visit and establish care. When you choose a [facility] primary care physician or advanced practice provider, you gain a long-term partner in health. Find a Primary Care Provider  Learn more about [facility]'s in-office and virtual care options: [facility]

## 2021-11-27 NOTE — Progress Notes (Signed)
[name], tugwell are scheduled for a virtual visit with your provider today.    Just as we do with appointments in the office, we must obtain your consent to participate.  Your consent will be active for this visit and any virtual visit you may have with one of our providers in the next 365 days.    If you have a MyChart account, I can also send a copy of this consent to you electronically.  All virtual visits are billed to your insurance company just like a traditional visit in the office.  As this is a virtual visit, video technology does not allow for your provider to perform a traditional examination.  This may limit your provider's ability to fully assess your condition.  If your provider identifies any concerns that need to be evaluated in person or the need to arrange testing such as labs, EKG, etc, we will make arrangements to do so.    Although advances in technology are sophisticated, we cannot ensure that it will always work on either your end or our end.  If the connection with a video visit is poor, we may have to switch to a telephone visit.  With either a video or telephone visit, we are not always able to ensure that we have a secure connection.   I need to obtain your verbal consent now.   Are you willing to proceed with your visit today?   Maria Day has provided verbal consent on 11/27/2021 for a virtual visit (video or telephone).   Rodney Booze, PA-C 11/27/2021  10:25 AM   Date:  11/27/2021   ID:  Maria Day, DOB [birth year], MRN [id number]  Patient Location: Home Provider Location: Home Office   Participants: Patient and Provider for Visit and Wrap up  Method of visit: Video  Location of Patient: Home Location of Provider: Home Office Consent was obtain for visit over the video. Services rendered by provider: Visit was performed via video  A video enabled telemedicine application was used and I verified that I am speaking with the correct person using two  identifiers.  PCP:  Pcp, No   Chief Complaint:  dizziness  History of Present Illness:    Maria Day is a 26 y.o. female with history as stated below. Presents video telehealth for an acute care visit  Pt states she has a h/o epilepsy and states she is having difficulty walking. She states that she feels dizzy and has not been able to walk without holding onto things. Sxs started around 10am yesterday morning.   She contacted her neurologist and was initially advised to start taking klonopin however her symptoms worsened with this medication and so she was later advised to stop it. She has been compliant with her other medications including the Lamictal that was recently increased.  She states she feels abnormal "like my body is here but I'm not here". Denies visual changes. Denies unilateral numbness/weakness.  Past Medical, Surgical, Social History, Allergies, and Medications have been Reviewed.  Past Medical History:  Diagnosis Date   ADHD (attention deficit hyperactivity disorder)    Cognitive impairment    Myoclonus    Reactive depression    Seizures ([place])     No outpatient medications have been marked as taking for the 11/27/21 encounter (Video Visit) with [place].     Allergies:   Penicillins and Amoxicillin   ROS See HPI for history of present illness.  Physical Exam Constitutional:      Appearance:  Normal appearance.  Neurological:     Mental Status: She is alert.     Comments: Clear speech, no obvious facial droop            MDM: Pt with hx epilepsy. She states for the last 24 hours she has been dizzy and is having to hold onto walls to walk. I am not able to full assess her neurologic status to r/o emergent cause of sxs such as stroke via video visit. She will need to be seen in person. Pt agreeable for in person visit today.  There are no diagnoses linked to this encounter.   Time:   Today, I have spent 12 minutes with the patient with  telehealth technology discussing the above problems, reviewing the chart, previous notes, medications and orders.    Tests Ordered: No orders of the defined types were placed in this encounter.   Medication Changes: No orders of the defined types were placed in this encounter.    Disposition:  Follow up  Signed, Rodney Booze, PA-C  11/27/2021 10:25 AM

## 2021-11-28 NOTE — Telephone Encounter (Signed)
I spoke to the patient. Feeling better but would like to schedule a follow up this week. No seizure activity. Dr. Teresa Coombs is out of the office. She has been added to his schedule 12/08/21 and placed on wait list.

## 2021-11-30 ENCOUNTER — Other Ambulatory Visit: Payer: Self-pay | Admitting: *Deleted

## 2021-11-30 ENCOUNTER — Other Ambulatory Visit (INDEPENDENT_AMBULATORY_CARE_PROVIDER_SITE_OTHER): Payer: Self-pay

## 2021-11-30 DIAGNOSIS — G40309 Generalized idiopathic epilepsy and epileptic syndromes, not intractable, without status epilepticus: Secondary | ICD-10-CM

## 2021-11-30 DIAGNOSIS — Z79899 Other long term (current) drug therapy: Secondary | ICD-10-CM

## 2021-11-30 DIAGNOSIS — Z0289 Encounter for other administrative examinations: Secondary | ICD-10-CM

## 2021-11-30 NOTE — Progress Notes (Signed)
I have spoken with Dr. Terrace Arabia (work-in MD). Per vo, place orders for levetiracetam and lamotrigine labs. She should also skip her evening dose of lamotrigine tonight. Resume lamotrigine 200mg , one tab BID tomorrow.  I have reviewed this plan with the patient. I ask her to remove the lamotrigirine 100mg  bottle out of her medication bag (to avoid any further confusion). She will come to our office today for lab work. She will restart lamotrigine 200mg , one tab BID tomorrow. Continue all other prescriptions as directed. Also, keep pending appt 12/04/21.

## 2021-11-30 NOTE — Telephone Encounter (Signed)
I spoke to the patient again today. Says for at least two weeks, she wakes up every morning with dizziness, fatigue and extremity jerking. She wakes up around 8:30am and the symptoms will last to 1-2pm. She is having a hard time getting motivated, often has to stay in bed. Also, feels she is having out-of-body sensations. States the following:  "I'm here but my body is not".  "I can see someone but it's like I can't".  "I don't feel like I'm inside my body".   She has tried clonazepam but reports the medication makes her feel worse.   I ask her to pull out her medication bottles so we could review the instructions and strengths. I confirmed she is taking the clobazam, fluoxetine and levetiracetam correctly.   She has been taking lamotrigine incorrectly. On 11/10/21, she was increased to 200mg , one tab BID. She also continued taking the old rx as well. Lamotrigine 100mg , 1 tab in am and 1.5 in pm. This is about the same time frame she has been feeling poorly.

## 2021-11-30 NOTE — Telephone Encounter (Signed)
Pt would like a call from the nurse to discuss to see if possibly can take something. Pt said feel like she is going to have a seizure. Would like a call from the nurse.

## 2021-11-30 NOTE — Telephone Encounter (Signed)
I discussed this with Dr. Terrace Arabia (work-in MD). Per her vo, place lab orders for lamotrigine and levetiracetam. Hold lamotrigine dose this evening. Restart lamotrigine 200mg , one tab BID tomorrow.  I have reviewed the plan with the patient. I ask her to remove the lamotrigine 100mg  prescription out of her bag (to avoid further confusion). She will come to our office for lab work today. She will hold her evening dose of lamotrigine tonight and restart the 200mg  tablets tomorrow, one BID. Continue all other medications as prescribed. She will call back with any other concerns and will keep her pending appt 12/04/21. She has been informed of [place] driving laws (no driving until six month seizure free).

## 2021-12-04 ENCOUNTER — Encounter: Payer: Self-pay | Admitting: Neurology

## 2021-12-04 ENCOUNTER — Ambulatory Visit (INDEPENDENT_AMBULATORY_CARE_PROVIDER_SITE_OTHER): Payer: Medicare Other | Admitting: Neurology

## 2021-12-04 VITALS — BP 105/75 | HR 89 | Ht 60.0 in | Wt 170.0 lb

## 2021-12-04 DIAGNOSIS — G40309 Generalized idiopathic epilepsy and epileptic syndromes, not intractable, without status epilepticus: Secondary | ICD-10-CM | POA: Diagnosis not present

## 2021-12-04 DIAGNOSIS — G43009 Migraine without aura, not intractable, without status migrainosus: Secondary | ICD-10-CM

## 2021-12-04 NOTE — Patient Instructions (Signed)
Discontinue Clobazam (side effect of lethargy, fatigue)  Continue with Lamotrigine 200 mg twice daily  Continue with Keppra 1500 mg  Follow up in 3 months, contact me sooner for any other issues

## 2021-12-04 NOTE — Progress Notes (Signed)
[facility]  PATIENT: Maria Day DOB: [birth year]  REFERRING CLINICIAN: No ref. provider found HISTORY FROM: Patient and mother  REASON FOR VISIT: Generalized epilepsy.    HISTORICAL  CHIEF COMPLAINT:  Chief Complaint  Patient presents with   Follow-up    Room 12, alone Pt reports no new seizures, states she feels like she is taking too much medication, c/o balance issues and fatigue    INTERVAL HISTORY 12/04/21:  Patient presents today for follow-up, last visit was in January.  At that time she was complaining of out of body experience dizziness.  She was referred to EMU, medication was held, she did not have the new events but had 2 generalized seizures.  Since then she continued to have anxiety about having another seizure, dizziness, and out of body experience.  In May, while in vacation she had two generalized seizures.  At that time we increased her Keppra to 1500 mg twice daily.  She was also on clobazam 10 mg nightly and lamotrigine 200 mg twice daily but patient reports she was taking an extra 100 mg twice daily of Lamictal.  She contacted the office last week, complaining of feeling drunk, lightheaded, dizzy and plan was to discontinue the additional 100 mg of Lamictal.  Since patient discontinued the additional dose, she reported her dizziness and lightheadedness of feeling drunk improved.  Currently she is on clobazam 10 mg that she take in the morning, lamotrigine 200 mg twice daily and levetiracetam 1500 mg twice daily, denies any generalized seizure but continues to experience out of body phenomenon, feels like I am going to have a seizure.     INTERVAL HISTORY 07/31/2021:  Patient presents today for follow-up, she is accompanied by her mom.  Last visit plan was to continue ASM and to continue to monitor the symptoms.  She did have a routine EEG which was normal, during the test she did have an event with photic stimulation which was nonepileptic.  She  continued to have spells, last 1 was on January 28 after work. Patient reported she is able to tell when she before having these event because she feels tired sometimes dizzy and afterward she has bad headache.  I have referred her to the EMU for characterization which is scheduled for August 21, 2021.  She reported she is has increased stress, she is very worried, she worries herself a lot about work and her family.   HISTORY OF PRESENT ILLNESS:  26 year old woman with past medical history of depression anxiety, generalized epilepsy who is presenting to establish care.  Per mother patient had a first seizure at the age of 26 in the setting of illness and fever, work-up at that time was unrevealling.  She was doing fine until the age of 26 where she had a second seizure.  At that time she was put on Keppra but they report patient having seizures every 24 years.  Keppra has been uptitrated, lamotrigine added and her last seizure was in 2019.  Her seizure was described as eyes looking up, rolled back ,she will have stiffness and this would last about 1 minute followed by postictal confusion.  No additional seizure like that since 2019.  Patient now describe a second type of seizure described as not feeling well, feels weak, will get dizzy and she had to sit down and will have minor shaking.  This lasts between 1 to 2 minutes and no postictal confusion.  She reported 3 weeks ago she had a  cluster of 3 seizures, and this was in the setting of high stress.  Previously she was living with her father but due to friction between patient and father she had moved back to her mom's house in the past 3 years.  She said that the second type of seizure frequency increase due to high stress.  She has seen multiple neurologist/epileptologists for her seizure management.    Handedness: left handed   Seizure Type: Type I described as eyes rolled back, whole body stiffness lasting less than 1 minute followed by postictal  confusion.  Last episode was in 2019. Type II described as feeling weak not feeling well, will get dizzy, has to sit down and mild shaking, sometimes she reports she is aware, no postictal confusion.   Current frequency: Type I last seizure 2019, type II had 5 seizures this month  Any injuries from seizures: concussion, bruises  Seizure risk factors: Febrile sz at the age 26, meconium staining, stay in couple days with IV, knot in umbilical cord, low apgar score   Previous ASMs: Keppra, Lamotrigine, Clobazam   Currenty ASMs: Keppra 1500 BID, Lamotrigine 200 mg AM and 200 mg PM  ASMs side effects: Tired all the time, no energy, like to lay down and sleep   Brain Images: Normal brain MRI  Previous EEGs: Occasional generalized irregular high-voltage 4 to 5 Hz spike and polyspike wave discharge   OTHER MEDICAL CONDITIONS: Anxiety/Depression, Epilepsy  REVIEW OF SYSTEMS: Full 14 system review of systems performed and negative with exception of: as noted in the HPI  ALLERGIES: Allergies  Allergen Reactions   Penicillins Rash   Amoxicillin Rash    HOME MEDICATIONS: Outpatient Medications Prior to Visit  Medication Sig Dispense Refill   FLUoxetine HCl 60 MG TABS Take 1 tablet at bedtime (Patient taking differently: Take 1 tablet by mouth daily.) 90 tablet 1   lamoTRIgine (LAMICTAL) 200 MG tablet Take 1 tablet (200 mg total) by mouth 2 (two) times daily. 180 tablet 1   levETIRAcetam (KEPPRA) 1000 MG tablet Take 1 tablet (1,000 mg total) by mouth 2 (two) times daily. Take with 500 mg tablet for a total of 1500 mg twice a day 180 tablet 1   levETIRAcetam (KEPPRA) 500 MG tablet Take 1 tablet (500 mg total) by mouth 2 (two) times daily. Take in addition to 1000 mg tablet for a total of 1500 mg twice a day 60 tablet 3   cloBAZam (ONFI) 10 MG tablet Take 1 tablet (10 mg total) by mouth daily. 30 tablet 5   clonazePAM (KLONOPIN) 1 MG tablet Take 1 tablet (1 mg total) by mouth 2 (two) times  daily for 3 days. 6 tablet 0   No facility-administered medications prior to visit.    PAST MEDICAL HISTORY: Past Medical History:  Diagnosis Date   ADHD (attention deficit hyperactivity disorder)    Cognitive impairment    Myoclonus    Reactive depression    Seizures ([place])     PAST SURGICAL HISTORY: History reviewed. No pertinent surgical history.  FAMILY HISTORY: Family History  Problem Relation Age of Onset   Cancer Father        Leukemia   Hyperlipidemia Maternal Grandfather    Diabetes Paternal Grandfather     SOCIAL HISTORY: Social History   Socioeconomic History   Marital status: Single    Spouse name: Not on file   Number of children: Not on file   Years of education: Not on file   Highest education  level: Not on file  Occupational History   Not on file  Tobacco Use   Smoking status: Never   Smokeless tobacco: Never  Substance and Sexual Activity   Alcohol use: No   Drug use: No   Sexual activity: Never  Other Topics Concern   Not on file  Social History Narrative   Not on file   Social Determinants of Health   Financial Resource Strain: Not on file  Food Insecurity: Not on file  Transportation Needs: Not on file  Physical Activity: Not on file  Stress: Not on file  Social Connections: Not on file  Intimate Partner Violence: Not on file     PHYSICAL EXAM  GENERAL EXAM/CONSTITUTIONAL: Vitals:  Vitals:   12/04/21 0933  BP: 105/75  Pulse: 89  Weight: 170 lb (77.1 kg)  Height: 5' (1.524 m)   Body mass index is 33.2 kg/m. Wt Readings from Last 3 Encounters:  12/04/21 170 lb (77.1 kg)  08/21/21 177 lb (80.3 kg)  07/31/21 157 lb (71.2 kg)   Patient is in no distress; well developed, nourished and groomed; neck is supple  EYES: Pupils round and reactive to light, Visual fields full to confrontation, Extraocular movements intacts,  No results found.  MUSCULOSKELETAL: Gait, strength, tone, movements noted in Neurologic exam  below  NEUROLOGIC: MENTAL STATUS:      View : No data to display.         awake, alert, oriented to person, place and time recent and remote memory intact normal attention and concentration language fluent, comprehension intact, naming intact fund of knowledge appropriate  CRANIAL NERVE: 2nd, 3rd, 4th, 6th - pupils equal and reactive to light, visual fields full to confrontation, extraocular muscles intact, no nystagmus 5th - facial sensation symmetric 7th - facial strength symmetric 8th - hearing intact 9th - palate elevates symmetrically, uvula midline 11th - shoulder shrug symmetric 12th - tongue protrusion midline  MOTOR:  normal bulk and tone, full strength in the BUE, BLE  SENSORY:  normal and symmetric to light touch, pinprick, temperature, vibration  COORDINATION:  finger-nose-finger, fine finger movements normal  REFLEXES:  deep tendon reflexes present and symmetric  GAIT/STATION:  normal    DIAGNOSTIC DATA (LABS, IMAGING, TESTING) - I reviewed patient records, labs, notes, testing and imaging myself where available.  Lab Results  Component Value Date   WBC 7.0 08/21/2021   HGB 14.2 08/21/2021   HCT 42.4 08/21/2021   MCV 89.5 08/21/2021   PLT 290 08/21/2021      Component Value Date/Time   NA 139 08/21/2021 1004   NA 142 04/26/2021 0920   K 4.2 08/21/2021 1004   CL 105 08/21/2021 1004   CO2 25 08/21/2021 1004   GLUCOSE 84 08/21/2021 1004   BUN 10 08/21/2021 1004   BUN 12 04/26/2021 0920   CREATININE 0.95 08/21/2021 1004   CALCIUM 9.2 08/21/2021 1004   PROT 7.0 08/21/2021 1004   PROT 7.4 04/26/2021 0920   ALBUMIN 3.9 08/21/2021 1004   ALBUMIN 4.7 04/26/2021 0920   AST 17 08/21/2021 1004   ALT 13 08/21/2021 1004   ALKPHOS 64 08/21/2021 1004   BILITOT 0.3 08/21/2021 1004   BILITOT 0.3 04/26/2021 0920   GFRNONAA >60 08/21/2021 1004   GFRAA >60 10/08/2016 0840   No results found for: CHOL, HDL, LDLCALC, LDLDIRECT, TRIG No results found  for: HGBA1C No results found for: VITAMINB12 Lab Results  Component Value Date   TSH 3.595 08/21/2021    MRI  Brain 2018 at [facility]: Reported as normal  EEG 2018 This 1-hour awake and asleep EEG is abnormal due to the presence of occasional generalized irregular high voltage 4-5 Hz spike and polyspike and wave discharges, some with right-sided predominance.   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 The right-sided predominance of some epileptiform discharges also raises the possibility of focal seizures with secondary bisynchrony   Routine EEG 06/30/2021 This is a normal routine EEG. A normal EEG does not exclude a diagnosis of epilepsy.  Ambulatory EEG 06/30/2021 This is a normal video ambulatory EEG. There was one event of seeing flashes of light when closing eyes with no changes in the EEG background. A normal EEG does not exclude a diagnosis of epilepsy.   EMU Admission:  Hospital Course: Ms. Keeran was admitted to epilepsy monitoring unit from 08/21/2021 to 08/25/2021.  During this.,  Patient went continuous video EEG monitoring, had hyperventilation, photic stimulation as well as her antiepileptics were discontinued.  We captured 2 events which were consistent with seizures, with generalized onset.  However, focal epilepsy from right frontal region could also have similar appearance.  Unfortunately, we were unable to capture the new events patient has had during which patient does not feel well, comes to her mother and then has full body shaking.  It is possible that patient has coexisting nonepileptic events.  Discussed with mother about taking a video of events in future if able to. Patient also reported significant stress.  Therefore we recommend continuing home dose of lamotrigine and Keppra and started Onfi 5 mg daily with plan to gradually wean off Keppra and increase Onfi to help with mood stabilization.  We also recommended  follow-up with psychiatry for anxiety and depression.  Discussed seizure precautions including do not drive.  Recommend follow-up with Dr. April Manson.  ASSESSMENT AND PLAN  26 y.o. year old female  with past medical history of anxiety/depression, generalized epilepsy who is presenting for follow up for management of her generalized epilepsy and additional event concerning for nonepileptic seizures vs. Medication side effects.     1. Generalized idiopathic epilepsy and epileptic syndromes, not intractable, without status epilepticus ([place])   2. Migraine without aura and without status migrainosus, not intractable      Patient Instructions  Discontinue Clobazam (side effect of lethargy, fatigue)  Continue with Lamotrigine 200 mg twice daily  Continue with Keppra 1500 mg  Follow up in 3 months, contact me sooner for any other issues    Per [facility] statutes, patients with seizures are not allowed to drive until they have been seizure-free for six months.  Other recommendations include using caution when using heavy equipment or power tools. Avoid working on ladders or at heights. Take showers instead of baths.  Do not swim alone.  Ensure the water temperature is not too high on the home water heater. Do not go swimming alone. Do not lock yourself in a room alone (i.e. bathroom). When caring for infants or small children, sit down when holding, feeding, or changing them to minimize risk of injury to the child in the event you have a seizure. Maintain good sleep hygiene. Avoid alcohol.  Also recommend adequate sleep, hydration, good diet and minimize stress.   During the Seizure  - First, ensure adequate ventilation and place patients on the floor on their left side  Loosen clothing around the neck and ensure the airway is patent. If the patient is clenching the teeth,  do not force the mouth open with any object as this can cause severe damage - Remove all items from the surrounding that  can be hazardous. The patient may be oblivious to what's happening and may not even know what he or she is doing. If the patient is confused and wandering, either gently guide him/her away and block access to outside areas - Reassure the individual and be comforting - Call 911. In most cases, the seizure ends before EMS arrives. However, there are cases when seizures may last over 3 to 5 minutes. Or the individual may have developed breathing difficulties or severe injuries. If a pregnant patient or a person with diabetes develops a seizure, it is prudent to call an ambulance. - Finally, if the patient does not regain full consciousness, then call EMS. Most patients will remain confused for about 45 to 90 minutes after a seizure, so you must use judgment in calling for help. - Avoid restraints but make sure the patient is in a bed with padded side rails - Place the individual in a lateral position with the neck slightly flexed; this will help the saliva drain from the mouth and prevent the tongue from falling backward - Remove all nearby furniture and other hazards from the area - Provide verbal assurance as the individual is regaining consciousness - Provide the patient with privacy if possible - Call for help and start treatment as ordered by the caregiver   After the Seizure (Postictal Stage)  After a seizure, most patients experience confusion, fatigue, muscle pain and/or a headache. Thus, one should permit the individual to sleep. For the next few days, reassurance is essential. Being calm and helping reorient the person is also of importance.  Most seizures are painless and end spontaneously. Seizures are not harmful to others but can lead to complications such as stress on the lungs, brain and the heart. Individuals with prior lung problems may develop labored breathing and respiratory distress.     No orders of the defined types were placed in this encounter.   No orders of the defined  types were placed in this encounter.    Return in about 3 months (around 03/06/2022).  I have spent a total of 42 minutes dedicated to this patient today, preparing to see patient, performing a medically appropriate examination and evaluation, ordering tests and/or medications and procedures, and counseling and educating the patient/family/caregiver; independently interpreting result and communicating results to the family/patient/caregiver; and documenting clinical information in the electronic medical record.   Alric Ran, MD 12/04/2021, 10:16 AM  [facility] [address] [place], [place] [address] [phone]

## 2021-12-04 NOTE — Telephone Encounter (Signed)
Thank you Dr. Leta Baptist. I will see her today in clinic.

## 2021-12-05 LAB — LEVETIRACETAM LEVEL: Levetiracetam Lvl: 43.7 ug/mL — ABNORMAL HIGH (ref 10.0–40.0)

## 2021-12-05 LAB — LAMOTRIGINE LEVEL: Lamotrigine Lvl: 18.1 ug/mL (ref 2.0–20.0)

## 2021-12-08 ENCOUNTER — Ambulatory Visit: Payer: BLUE CROSS/BLUE SHIELD | Admitting: Neurology

## 2021-12-13 ENCOUNTER — Encounter: Payer: Self-pay | Admitting: Neurology

## 2021-12-20 ENCOUNTER — Other Ambulatory Visit: Payer: Self-pay | Admitting: Neurology

## 2021-12-20 MED ORDER — CLONAZEPAM 1 MG PO TABS: 1.0000 mg | ORAL_TABLET | Freq: Two times a day (BID) | ORAL | 0 refills | Status: DC

## 2021-12-22 ENCOUNTER — Encounter: Payer: Self-pay | Admitting: Neurology

## 2021-12-25 ENCOUNTER — Telehealth: Payer: Self-pay | Admitting: Neurology

## 2021-12-25 ENCOUNTER — Other Ambulatory Visit: Payer: Self-pay | Admitting: Neurology

## 2021-12-25 MED ORDER — CLONAZEPAM 1 MG PO TABS: 1.0000 mg | ORAL_TABLET | Freq: Two times a day (BID) | ORAL | 0 refills | Status: DC

## 2021-12-25 NOTE — Telephone Encounter (Signed)
Pt states the seizure she had last Friday has her very concerned (Pt confirmed she did not go to the ED because she has not bumped her head or had any type injury) Pt is just very anxious and would very much like to try and be seen today.  Phone rep told pt she would add to message from earlier and even send as high priority for a call back.

## 2021-12-27 ENCOUNTER — Encounter: Payer: Self-pay | Admitting: Neurology

## 2021-12-27 ENCOUNTER — Ambulatory Visit (INDEPENDENT_AMBULATORY_CARE_PROVIDER_SITE_OTHER): Payer: Medicare Other | Admitting: Neurology

## 2021-12-27 ENCOUNTER — Telehealth: Payer: Self-pay | Admitting: Neurology

## 2021-12-27 DIAGNOSIS — F419 Anxiety disorder, unspecified: Secondary | ICD-10-CM

## 2021-12-27 DIAGNOSIS — G40309 Generalized idiopathic epilepsy and epileptic syndromes, not intractable, without status epilepticus: Secondary | ICD-10-CM

## 2021-12-27 NOTE — Telephone Encounter (Signed)
Referral for Psychiatry sent to [facility] [phone]. 

## 2021-12-27 NOTE — Progress Notes (Signed)
[facility]  PATIENT: Maria Day DOB: 30-Sep-1995  REFERRING CLINICIAN: No ref. provider found HISTORY FROM: Patient and mother  REASON FOR VISIT: Generalized epilepsy.    HISTORICAL  CHIEF COMPLAINT:  Chief Complaint  Patient presents with   Follow-up    Rm 13. Alone. Seizure f/u.    INTERVAL HISTORY 12/27/21: Maria Day presents today for follow-up.  She has been contacted me over the last week with symptom of out of body experience, feeling off, not feeling like her normal self.  At that time I did reassure her that these are not her generalized convulsion seizures and most likely, they are anxiety symptoms.  She is currently going under a lot of stress, is not living with her mother anymore and trying to find a new apartment, currently she lives with a roommate.  I did prescribe her a low dose Klonopin to take twice daily but she has not taken it.  She contacted me yesterday again and I did reassure her that these are not her typical epileptic seizures, strongly advised her to take the medication and she finally took it until last night.  She took it last night, took it this morning and presents today for follow-up and stated her symptoms are feeling much better she is almost back to her normal self.  For her generalized epilepsy she is on levetiracetam 1500 mg twice daily and lamotrigine 200 mg twice daily.  She is also on fluoxetine 60 mg for her anxiety symptoms.  She does not have a psychiatrist.   INTERVAL HISTORY 12/04/21:  Patient presents today for follow-up, last visit was in January.  At that time she was complaining of out of body experience dizziness.  She was referred to EMU, medication was held, she did not have the new events but had 2 generalized seizures.  Since then she continued to have anxiety about having another seizure, dizziness, and out of body experience.  In May, while in vacation she had two generalized seizures.  At that time we  increased her Keppra to 1500 mg twice daily.  She was also on clobazam 10 mg nightly and lamotrigine 200 mg twice daily but patient reports she was taking an extra 100 mg twice daily of Lamictal.  She contacted the office last week, complaining of feeling drunk, lightheaded, dizzy and plan was to discontinue the additional 100 mg of Lamictal.  Since patient discontinued the additional dose, she reported her dizziness and lightheadedness of feeling drunk improved.  Currently she is on clobazam 10 mg that she take in the morning, lamotrigine 200 mg twice daily and levetiracetam 1500 mg twice daily, denies any generalized seizure but continues to experience out of body phenomenon, feels like I am going to have a seizure.    INTERVAL HISTORY 07/31/2021:  Patient presents today for follow-up, she is accompanied by her mom.  Last visit plan was to continue ASM and to continue to monitor the symptoms.  She did have a routine EEG which was normal, during the test she did have an event with photic stimulation which was nonepileptic.  She continued to have spells, last 1 was on January 28 after work. Patient reported she is able to tell when she before having these event because she feels tired sometimes dizzy and afterward she has bad headache.  I have referred her to the EMU for characterization which is scheduled for August 21, 2021.  She reported she is has increased stress, she is very worried, she worries  herself a lot about work and her family.   HISTORY OF PRESENT ILLNESS:  26 year old woman with past medical history of depression anxiety, generalized epilepsy who is presenting to establish care.  Per mother patient had a first seizure at the age of 26 in the setting of illness and fever, work-up at that time was unrevealling.  She was doing fine until the age of 89 where she had a second seizure.  At that time she was put on Keppra but they report patient having seizures every 2 years.  Keppra has been  uptitrated, lamotrigine added and her last seizure was in 2019.  Her seizure was described as eyes looking up, rolled back ,she will have stiffness and this would last about 1 minute followed by postictal confusion.  No additional seizure like that since 2019.  Patient now describe a second type of seizure described as not feeling well, feels weak, will get dizzy and she had to sit down and will have minor shaking.  This lasts between 1 to 2 minutes and no postictal confusion.  She reported 3 weeks ago she had a cluster of 3 seizures, and this was in the setting of high stress.  Previously she was living with her father but due to friction between patient and father she had moved back to her mom's house in the past 3 years.  She said that the second type of seizure frequency increase due to high stress.  She has seen multiple neurologist/epileptologists for her seizure management.   Handedness: left handed   Seizure Type: Type I described as eyes rolled back, whole body stiffness lasting less than 1 minute followed by postictal confusion.  Last episode was in 2019. Type II described as feeling weak not feeling well, will get dizzy, has to sit down and mild shaking, sometimes she reports she is aware, no postictal confusion.   Current frequency: Type I last seizure 2019, type II had 5 seizures this month  Any injuries from seizures: concussion, bruises  Seizure risk factors: Febrile sz at the age 26, meconium staining, stay in couple days with IV, knot in umbilical cord, low apgar score   Previous ASMs: Keppra, Lamotrigine, Clobazam   Currenty ASMs: Keppra 1500 BID, Lamotrigine 200 mg AM and 200 mg PM  ASMs side effects: Tired all the time, no energy, like to lay down and sleep   Brain Images: Normal brain MRI  Previous EEGs: Occasional generalized irregular high-voltage 4 to 5 Hz spike and polyspike wave discharge   OTHER MEDICAL CONDITIONS: Anxiety/Depression, Epilepsy  REVIEW OF  SYSTEMS: Full 14 system review of systems performed and negative with exception of: as noted in the HPI  ALLERGIES: Allergies  Allergen Reactions   Penicillins Rash   Cinnamon Swelling   Amoxicillin Rash    HOME MEDICATIONS: Outpatient Medications Prior to Visit  Medication Sig Dispense Refill   clonazePAM (KLONOPIN) 1 MG tablet Take 1 tablet (1 mg total) by mouth 2 (two) times daily for 7 days. 14 tablet 0   FLUoxetine HCl 60 MG TABS Take 1 tablet at bedtime (Patient taking differently: Take 1 tablet by mouth daily.) 90 tablet 1   lamoTRIgine (LAMICTAL) 200 MG tablet Take 1 tablet (200 mg total) by mouth 2 (two) times daily. 180 tablet 1   levETIRAcetam (KEPPRA) 1000 MG tablet Take 1 tablet (1,000 mg total) by mouth 2 (two) times daily. Take with 500 mg tablet for a total of 1500 mg twice a day 180 tablet 1  levETIRAcetam (KEPPRA) 500 MG tablet Take 1 tablet (500 mg total) by mouth 2 (two) times daily. Take in addition to 1000 mg tablet for a total of 1500 mg twice a day 60 tablet 3   No facility-administered medications prior to visit.    PAST MEDICAL HISTORY: Past Medical History:  Diagnosis Date   ADHD (attention deficit hyperactivity disorder)    Cognitive impairment    Myoclonus    Reactive depression    Seizures ([place])     PAST SURGICAL HISTORY: History reviewed. No pertinent surgical history.  FAMILY HISTORY: Family History  Problem Relation Age of Onset   Cancer Father        Leukemia   Hyperlipidemia Maternal Grandfather    Diabetes Paternal Grandfather     SOCIAL HISTORY: Social History   Socioeconomic History   Marital status: Single    Spouse name: Not on file   Number of children: Not on file   Years of education: Not on file   Highest education level: Not on file  Occupational History   Not on file  Tobacco Use   Smoking status: Never   Smokeless tobacco: Never  Substance and Sexual Activity   Alcohol use: No   Drug use: No   Sexual  activity: Never  Other Topics Concern   Not on file  Social History Narrative   Not on file   Social Determinants of Health   Financial Resource Strain: Not on file  Food Insecurity: Not on file  Transportation Needs: Not on file  Physical Activity: Not on file  Stress: Not on file  Social Connections: Not on file  Intimate Partner Violence: Not on file     PHYSICAL EXAM  GENERAL EXAM/CONSTITUTIONAL: Vitals:  Vitals:   12/27/21 0925  BP: 107/70  Pulse: 90  Weight: 175 lb (79.4 kg)  Height: 5' (1.524 m)    Body mass index is 34.18 kg/m. Wt Readings from Last 3 Encounters:  12/27/21 175 lb (79.4 kg)  12/04/21 170 lb (77.1 kg)  08/21/21 177 lb (80.3 kg)   Patient is in no distress; well developed, nourished and groomed; neck is supple  EYES: Pupils round and reactive to light, Visual fields full to confrontation, Extraocular movements intacts,  No results found.  MUSCULOSKELETAL: Gait, strength, tone, movements noted in Neurologic exam below  NEUROLOGIC: MENTAL STATUS:      No data to display         awake, alert, oriented to person, place and time recent and remote memory intact normal attention and concentration language fluent, comprehension intact, naming intact fund of knowledge appropriate  CRANIAL NERVE: 2nd, 3rd, 4th, 6th - pupils equal and reactive to light, visual fields full to confrontation, extraocular muscles intact, no nystagmus 5th - facial sensation symmetric 7th - facial strength symmetric 8th - hearing intact 9th - palate elevates symmetrically, uvula midline 11th - shoulder shrug symmetric 12th - tongue protrusion midline  MOTOR:  normal bulk and tone, full strength in the BUE, BLE  SENSORY:  normal and symmetric to light touch, pinprick, temperature, vibration  COORDINATION:  finger-nose-finger, fine finger movements normal  REFLEXES:  deep tendon reflexes present and symmetric  GAIT/STATION:  normal    DIAGNOSTIC  DATA (LABS, IMAGING, TESTING) - I reviewed patient records, labs, notes, testing and imaging myself where available.  Lab Results  Component Value Date   WBC 7.0 08/21/2021   HGB 14.2 08/21/2021   HCT 42.4 08/21/2021   MCV 89.5 08/21/2021  PLT 290 08/21/2021      Component Value Date/Time   NA 139 08/21/2021 1004   NA 142 04/26/2021 0920   K 4.2 08/21/2021 1004   CL 105 08/21/2021 1004   CO2 25 08/21/2021 1004   GLUCOSE 84 08/21/2021 1004   BUN 10 08/21/2021 1004   BUN 12 04/26/2021 0920   CREATININE 0.95 08/21/2021 1004   CALCIUM 9.2 08/21/2021 1004   PROT 7.0 08/21/2021 1004   PROT 7.4 04/26/2021 0920   ALBUMIN 3.9 08/21/2021 1004   ALBUMIN 4.7 04/26/2021 0920   AST 17 08/21/2021 1004   ALT 13 08/21/2021 1004   ALKPHOS 64 08/21/2021 1004   BILITOT 0.3 08/21/2021 1004   BILITOT 0.3 04/26/2021 0920   GFRNONAA >60 08/21/2021 1004   GFRAA >60 10/08/2016 0840   No results found for: "CHOL", "HDL", "LDLCALC", "LDLDIRECT", "TRIG" No results found for: "HGBA1C" No results found for: "VITAMINB12" Lab Results  Component Value Date   TSH 3.595 08/21/2021    MRI Brain 2018 at [facility]: Reported as normal  EEG 2018 This 1-hour awake and asleep EEG is abnormal due to the presence of occasional generalized irregular high voltage 4-5 Hz spike and polyspike and wave discharges, some with right-sided predominance.   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 The right-sided predominance of some epileptiform discharges also raises the possibility of focal seizures with secondary bisynchrony   Routine EEG 06/30/2021 This is a normal routine EEG. A normal EEG does not exclude a diagnosis of epilepsy.  Ambulatory EEG 06/30/2021 This is a normal video ambulatory EEG. There was one event of seeing flashes of light when closing eyes with no changes in the EEG background. A normal EEG does not exclude a diagnosis of epilepsy.   EMU  Admission:  Hospital Course: Ms. Posch was admitted to epilepsy monitoring unit from 08/21/2021 to 08/25/2021.  During this.,  Patient went continuous video EEG monitoring, had hyperventilation, photic stimulation as well as her antiepileptics were discontinued.  We captured 2 events which were consistent with seizures, with generalized onset.  However, focal epilepsy from right frontal region could also have similar appearance.  Unfortunately, we were unable to capture the new events patient has had during which patient does not feel well, comes to her mother and then has full body shaking.  It is possible that patient has coexisting nonepileptic events.  Discussed with mother about taking a video of events in future if able to. Patient also reported significant stress.  Therefore we recommend continuing home dose of lamotrigine and Keppra and started Onfi 5 mg daily with plan to gradually wean off Keppra and increase Onfi to help with mood stabilization.  We also recommended follow-up with psychiatry for anxiety and depression.  Discussed seizure precautions including do not drive.  Recommend follow-up with Dr. April Manson.  ASSESSMENT AND PLAN  26 y.o. year old female  with past medical history of anxiety/depression, generalized epilepsy who is presenting for follow up for management of her generalized epilepsy and additional event concerning for nonepileptic seizures vs. medication side effects but likely anxiety reaction.     1. Generalized idiopathic epilepsy and epileptic syndromes, not intractable, without status epilepticus ([place])   2. Anxiety      Patient Instructions  Continue current medications: levetiracetam 1500 mg twice daily and lamotrigine 200 mg twice daily Continue with Klonopin 1 mg half tablet twice daily Referral to psychiatry for management of worsening anxiety Follow-up as scheduled in  September.   Per [facility] statutes, patients with seizures are not allowed to drive  until they have been seizure-free for six months.  Other recommendations include using caution when using heavy equipment or power tools. Avoid working on ladders or at heights. Take showers instead of baths.  Do not swim alone.  Ensure the water temperature is not too high on the home water heater. Do not go swimming alone. Do not lock yourself in a room alone (i.e. bathroom). When caring for infants or small children, sit down when holding, feeding, or changing them to minimize risk of injury to the child in the event you have a seizure. Maintain good sleep hygiene. Avoid alcohol.  Also recommend adequate sleep, hydration, good diet and minimize stress.   During the Seizure  - First, ensure adequate ventilation and place patients on the floor on their left side  Loosen clothing around the neck and ensure the airway is patent. If the patient is clenching the teeth, do not force the mouth open with any object as this can cause severe damage - Remove all items from the surrounding that can be hazardous. The patient may be oblivious to what's happening and may not even know what he or she is doing. If the patient is confused and wandering, either gently guide him/her away and block access to outside areas - Reassure the individual and be comforting - Call 911. In most cases, the seizure ends before EMS arrives. However, there are cases when seizures may last over 3 to 5 minutes. Or the individual may have developed breathing difficulties or severe injuries. If a pregnant patient or a person with diabetes develops a seizure, it is prudent to call an ambulance. - Finally, if the patient does not regain full consciousness, then call EMS. Most patients will remain confused for about 45 to 90 minutes after a seizure, so you must use judgment in calling for help. - Avoid restraints but make sure the patient is in a bed with padded side rails - Place the individual in a lateral position with the neck slightly  flexed; this will help the saliva drain from the mouth and prevent the tongue from falling backward - Remove all nearby furniture and other hazards from the area - Provide verbal assurance as the individual is regaining consciousness - Provide the patient with privacy if possible - Call for help and start treatment as ordered by the caregiver   After the Seizure (Postictal Stage)  After a seizure, most patients experience confusion, fatigue, muscle pain and/or a headache. Thus, one should permit the individual to sleep. For the next few days, reassurance is essential. Being calm and helping reorient the person is also of importance.  Most seizures are painless and end spontaneously. Seizures are not harmful to others but can lead to complications such as stress on the lungs, brain and the heart. Individuals with prior lung problems may develop labored breathing and respiratory distress.     Orders Placed This Encounter  Procedures   Ambulatory referral to Psychiatry    No orders of the defined types were placed in this encounter.    No follow-ups on file.  I have spent a total of 42 minutes dedicated to this patient today, preparing to see patient, performing a medically appropriate examination and evaluation, ordering tests and/or medications and procedures, and counseling and educating the patient/family/caregiver; independently interpreting result and communicating results to the family/patient/caregiver; and documenting clinical information in the electronic medical record.  Alric Ran, MD 12/27/2021, 9:48 AM  [facility] [address], [place], [place] [address] [phone]

## 2021-12-27 NOTE — Patient Instructions (Signed)
Continue current medications: levetiracetam 1500 mg twice daily and lamotrigine 200 mg twice daily Continue with Klonopin 1 mg half tablet twice daily Referral to psychiatry for management of worsening anxiety Follow-up as scheduled in September.

## 2022-01-04 ENCOUNTER — Other Ambulatory Visit: Payer: Self-pay | Admitting: Neurology

## 2022-01-04 MED ORDER — CLONAZEPAM 0.5 MG PO TABS: 0.5000 mg | ORAL_TABLET | Freq: Two times a day (BID) | ORAL | 0 refills | Status: DC

## 2022-01-04 NOTE — Progress Notes (Signed)
New Rx for Klonopin 0.5 mg BID for 30 days sent to the pharmacy.

## 2022-01-31 ENCOUNTER — Other Ambulatory Visit: Payer: Self-pay | Admitting: Neurology

## 2022-01-31 MED ORDER — HYDROXYZINE HCL 50 MG PO TABS: 50.0000 mg | ORAL_TABLET | Freq: Three times a day (TID) | ORAL | 3 refills | Status: DC

## 2022-01-31 MED ORDER — CLONAZEPAM 0.5 MG PO TABS: 0.5000 mg | ORAL_TABLET | Freq: Every day | ORAL | 0 refills | Status: DC

## 2022-02-10 ENCOUNTER — Other Ambulatory Visit: Payer: Self-pay | Admitting: Neurology

## 2022-02-12 ENCOUNTER — Telehealth: Payer: Self-pay | Admitting: Neurology

## 2022-02-12 NOTE — Telephone Encounter (Signed)
Mychart message has been sent to the provider on this.

## 2022-02-12 NOTE — Telephone Encounter (Signed)
Pt is asking for a call to discuss the side effects she is having to all of her seizure medications.  Pt was asked to go over her side effects she is having, pt said Dr Teresa Coombs is aware of them, please call.

## 2022-02-13 NOTE — Telephone Encounter (Signed)
Done. She is still very anxious about having another seizure but has not follow up with psychiatry.

## 2022-02-13 NOTE — Telephone Encounter (Signed)
Pt called back and said she would like for someone to call her back.

## 2022-02-19 ENCOUNTER — Telehealth: Payer: Self-pay | Admitting: Neurology

## 2022-02-22 ENCOUNTER — Ambulatory Visit (INDEPENDENT_AMBULATORY_CARE_PROVIDER_SITE_OTHER): Payer: Medicare Other | Admitting: Neurology

## 2022-02-22 ENCOUNTER — Encounter: Payer: Self-pay | Admitting: Neurology

## 2022-02-22 ENCOUNTER — Telehealth: Payer: Self-pay | Admitting: Neurology

## 2022-02-22 VITALS — BP 115/77 | HR 79 | Ht 60.0 in | Wt 170.0 lb

## 2022-02-22 DIAGNOSIS — F419 Anxiety disorder, unspecified: Secondary | ICD-10-CM

## 2022-02-22 DIAGNOSIS — R569 Unspecified convulsions: Secondary | ICD-10-CM

## 2022-02-22 DIAGNOSIS — G40309 Generalized idiopathic epilepsy and epileptic syndromes, not intractable, without status epilepticus: Secondary | ICD-10-CM

## 2022-02-22 MED ORDER — CLONAZEPAM 0.5 MG PO TABS: 0.5000 mg | ORAL_TABLET | Freq: Two times a day (BID) | ORAL | 0 refills | Status: DC

## 2022-02-22 NOTE — Progress Notes (Signed)
[facility]  PATIENT: Maria Day DOB: October 04, 1995  REFERRING CLINICIAN: No ref. provider found HISTORY FROM: Patient and mother  REASON FOR VISIT: Generalized epilepsy.    HISTORICAL  CHIEF COMPLAINT:  Chief Complaint  Patient presents with   Follow-up    Rm 13. Accompanied by friend. Increased seizure activity concerns. Discuss medications.    INTERVAL HISTORY 02/22/22: Patient presents today for follow-up, since last visit I have started her on Klonopin for her anxiety and advised her to set up care with a psychiatrist.  She still has not set up care with a psychiatrist.  While on the Klonopin she was doing well but when she ran out of Klonopin I started her on Atarax for anxiety but she said that the medication was making her feel weird.  Since being off the Klonopin she is also reporting increase in the seizure-like activity.  She said last night she had a seizure in [place] and asked to call her mom and there is also causing her additional stress.  Reported her stress is still high.  Currently she does not have roommates and states that everything is fine.  For her generalized epilepsy she is on levetiracetam 1500 mg twice daily and lamotrigine 200 mg twice daily.  She denies any generalized convulsion.   INTERVAL HISTORY 12/27/21: Loisann presents today for follow-up.  She has been contacted me over the last week with symptom of out of body experience, feeling off, not feeling like her normal self.  At that time I did reassure her that these are not her generalized convulsion seizures and most likely, they are anxiety symptoms.  She is currently going under a lot of stress, is not living with her mother anymore and trying to find a new apartment, currently she lives with a roommate.  I did prescribe her a low dose Klonopin to take twice daily but she has not taken it.  She contacted me yesterday again and I did reassure her that these are not her typical  epileptic seizures, strongly advised her to take the medication and she finally took it until last night.  She took it last night, took it this morning and presents today for follow-up and stated her symptoms are feeling much better she is almost back to her normal self.  For her generalized epilepsy she is on levetiracetam 1500 mg twice daily and lamotrigine 200 mg twice daily.  She is also on fluoxetine 60 mg for her anxiety symptoms.  She does not have a psychiatrist.   INTERVAL HISTORY 12/04/21:  Patient presents today for follow-up, last visit was in January.  At that time she was complaining of out of body experience dizziness.  She was referred to EMU, medication was held, she did not have the new events but had 2 generalized seizures.  Since then she continued to have anxiety about having another seizure, dizziness, and out of body experience.  In May, while in vacation she had two generalized seizures.  At that time we increased her Keppra to 1500 mg twice daily.  She was also on clobazam 10 mg nightly and lamotrigine 200 mg twice daily but patient reports she was taking an extra 100 mg twice daily of Lamictal.  She contacted the office last week, complaining of feeling drunk, lightheaded, dizzy and plan was to discontinue the additional 100 mg of Lamictal.  Since patient discontinued the additional dose, she reported her dizziness and lightheadedness of feeling drunk improved.  Currently she is on  clobazam 10 mg that she take in the morning, lamotrigine 200 mg twice daily and levetiracetam 1500 mg twice daily, denies any generalized seizure but continues to experience out of body phenomenon, feels like I am going to have a seizure.    INTERVAL HISTORY 07/31/2021:  Patient presents today for follow-up, she is accompanied by her mom.  Last visit plan was to continue ASM and to continue to monitor the symptoms.  She did have a routine EEG which was normal, during the test she did have an event with  photic stimulation which was nonepileptic.  She continued to have spells, last 1 was on January 28 after work. Patient reported she is able to tell when she before having these event because she feels tired sometimes dizzy and afterward she has bad headache.  I have referred her to the EMU for characterization which is scheduled for August 21, 2021.  She reported she is has increased stress, she is very worried, she worries herself a lot about work and her family.   HISTORY OF PRESENT ILLNESS:  26 year old woman with past medical history of depression anxiety, generalized epilepsy who is presenting to establish care.  Per mother patient had a first seizure at the age of 4 in the setting of illness and fever, work-up at that time was unrevealling.  She was doing fine until the age of 30 where she had a second seizure.  At that time she was put on Keppra but they report patient having seizures every 2 years.  Keppra has been uptitrated, lamotrigine added and her last seizure was in 2019.  Her seizure was described as eyes looking up, rolled back ,she will have stiffness and this would last about 1 minute followed by postictal confusion.  No additional seizure like that since 2019.  Patient now describe a second type of seizure described as not feeling well, feels weak, will get dizzy and she had to sit down and will have minor shaking.  This lasts between 1 to 2 minutes and no postictal confusion.  She reported 3 weeks ago she had a cluster of 3 seizures, and this was in the setting of high stress.  Previously she was living with her father but due to friction between patient and father she had moved back to her mom's house in the past 3 years.  She said that the second type of seizure frequency increase due to high stress.  She has seen multiple neurologist/epileptologists for her seizure management.   Handedness: left handed   Seizure Type: Type I described as eyes rolled back, whole body stiffness  lasting less than 1 minute followed by postictal confusion.  Last episode was in 2019. Type II described as feeling weak not feeling well, will get dizzy, has to sit down and mild shaking, sometimes she reports she is aware, no postictal confusion.   Current frequency: Type I last seizure 2019, type II had 5 seizures this month  Any injuries from seizures: concussion, bruises  Seizure risk factors: Febrile sz at the age 9, meconium staining, stay in couple days with IV, knot in umbilical cord, low apgar score   Previous ASMs: Keppra, Lamotrigine, Clobazam   Currenty ASMs: Keppra 1500 BID, Lamotrigine 200 mg AM and 200 mg PM  ASMs side effects: Tired all the time, no energy, like to lay down and sleep   Brain Images: Normal brain MRI  Previous EEGs: Occasional generalized irregular high-voltage 4 to 5 Hz spike and polyspike wave discharge  OTHER MEDICAL CONDITIONS: Anxiety/Depression, Epilepsy  REVIEW OF SYSTEMS: Full 14 system review of systems performed and negative with exception of: as noted in the HPI  ALLERGIES: Allergies  Allergen Reactions   Penicillins Rash   Cinnamon Swelling   Amoxicillin Rash    HOME MEDICATIONS: Outpatient Medications Prior to Visit  Medication Sig Dispense Refill   FLUoxetine HCl 60 MG TABS Take 1 tablet at bedtime (Patient taking differently: Take 1 tablet by mouth daily.) 90 tablet 1   hydrOXYzine (ATARAX) 50 MG tablet Take 1 tablet (50 mg total) by mouth in the morning, at noon, and at bedtime. 90 tablet 3   lamoTRIgine (LAMICTAL) 200 MG tablet Take 1 tablet (200 mg total) by mouth 2 (two) times daily. 180 tablet 1   levETIRAcetam (KEPPRA) 1000 MG tablet Take 1 tablet (1,000 mg total) by mouth 2 (two) times daily. Take with 500 mg tablet for a total of 1500 mg twice a day 180 tablet 1   levETIRAcetam (KEPPRA) 500 MG tablet Take 1 tablet (500 mg total) by mouth 2 (two) times daily. Take in addition to 1000 mg tablet for a total of 1500 mg twice  a day 60 tablet 3   clonazePAM (KLONOPIN) 0.5 MG tablet Take 1 tablet (0.5 mg total) by mouth daily. 30 tablet 0   No facility-administered medications prior to visit.    PAST MEDICAL HISTORY: Past Medical History:  Diagnosis Date   ADHD (attention deficit hyperactivity disorder)    Cognitive impairment    Myoclonus    Reactive depression    Seizures ([place])     PAST SURGICAL HISTORY: History reviewed. No pertinent surgical history.  FAMILY HISTORY: Family History  Problem Relation Age of Onset   Cancer Father        Leukemia   Hyperlipidemia Maternal Grandfather    Diabetes Paternal Grandfather     SOCIAL HISTORY: Social History   Socioeconomic History   Marital status: Single    Spouse name: Not on file   Number of children: Not on file   Years of education: Not on file   Highest education level: Not on file  Occupational History   Not on file  Tobacco Use   Smoking status: Never   Smokeless tobacco: Never  Substance and Sexual Activity   Alcohol use: No   Drug use: No   Sexual activity: Never  Other Topics Concern   Not on file  Social History Narrative   Not on file   Social Determinants of Health   Financial Resource Strain: Not on file  Food Insecurity: Not on file  Transportation Needs: Not on file  Physical Activity: Not on file  Stress: Not on file  Social Connections: Not on file  Intimate Partner Violence: Not on file     PHYSICAL EXAM  GENERAL EXAM/CONSTITUTIONAL: Vitals:  Vitals:   02/22/22 1112  BP: 115/77  Pulse: 79  Weight: 170 lb (77.1 kg)  Height: 5' (1.524 m)    Body mass index is 33.2 kg/m. Wt Readings from Last 3 Encounters:  02/22/22 170 lb (77.1 kg)  12/27/21 175 lb (79.4 kg)  12/04/21 170 lb (77.1 kg)   Patient is in no distress; well developed, nourished and groomed; neck is supple, tearful and crying during examination.   EYES: Pupils round and reactive to light, Visual fields full to confrontation,  Extraocular movements intacts,  No results found.  MUSCULOSKELETAL: Gait, strength, tone, movements noted in Neurologic exam below  NEUROLOGIC: MENTAL STATUS:  No data to display         awake, alert, oriented to person, place and time recent and remote memory intact normal attention and concentration language fluent, comprehension intact, naming intact fund of knowledge appropriate  CRANIAL NERVE: 2nd, 3rd, 4th, 6th - pupils equal and reactive to light, visual fields full to confrontation, extraocular muscles intact, no nystagmus 5th - facial sensation symmetric 7th - facial strength symmetric 8th - hearing intact 9th - palate elevates symmetrically, uvula midline 11th - shoulder shrug symmetric 12th - tongue protrusion midline  MOTOR:  normal bulk and tone, full strength in the BUE, BLE  SENSORY:  normal and symmetric to light touch, pinprick, temperature, vibration  COORDINATION:  finger-nose-finger, fine finger movements normal  REFLEXES:  deep tendon reflexes present and symmetric  GAIT/STATION:  normal    DIAGNOSTIC DATA (LABS, IMAGING, TESTING) - I reviewed patient records, labs, notes, testing and imaging myself where available.  Lab Results  Component Value Date   WBC 7.0 08/21/2021   HGB 14.2 08/21/2021   HCT 42.4 08/21/2021   MCV 89.5 08/21/2021   PLT 290 08/21/2021      Component Value Date/Time   NA 139 08/21/2021 1004   NA 142 04/26/2021 0920   K 4.2 08/21/2021 1004   CL 105 08/21/2021 1004   CO2 25 08/21/2021 1004   GLUCOSE 84 08/21/2021 1004   BUN 10 08/21/2021 1004   BUN 12 04/26/2021 0920   CREATININE 0.95 08/21/2021 1004   CALCIUM 9.2 08/21/2021 1004   PROT 7.0 08/21/2021 1004   PROT 7.4 04/26/2021 0920   ALBUMIN 3.9 08/21/2021 1004   ALBUMIN 4.7 04/26/2021 0920   AST 17 08/21/2021 1004   ALT 13 08/21/2021 1004   ALKPHOS 64 08/21/2021 1004   BILITOT 0.3 08/21/2021 1004   BILITOT 0.3 04/26/2021 0920   GFRNONAA >60  08/21/2021 1004   GFRAA >60 10/08/2016 0840   No results found for: "CHOL", "HDL", "LDLCALC", "LDLDIRECT", "TRIG" No results found for: "HGBA1C" No results found for: "VITAMINB12" Lab Results  Component Value Date   TSH 3.595 08/21/2021    MRI Brain 2018 at [facility]: Reported as normal  EEG 2018 This 1-hour awake and asleep EEG is abnormal due to the presence of occasional generalized irregular high voltage 4-5 Hz spike and polyspike and wave discharges, some with right-sided predominance.   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 The right-sided predominance of some epileptiform discharges also raises the possibility of focal seizures with secondary bisynchrony   Routine EEG 06/30/2021 This is a normal routine EEG. A normal EEG does not exclude a diagnosis of epilepsy.  Ambulatory EEG 06/30/2021 This is a normal video ambulatory EEG. There was one event of seeing flashes of light when closing eyes with no changes in the EEG background. A normal EEG does not exclude a diagnosis of epilepsy.   EMU Admission:  Hospital Course: Ms. Berzins was admitted to epilepsy monitoring unit from 08/21/2021 to 08/25/2021.  During this.,  Patient went continuous video EEG monitoring, had hyperventilation, photic stimulation as well as her antiepileptics were discontinued.  We captured 2 events which were consistent with seizures, with generalized onset.  However, focal epilepsy from right frontal region could also have similar appearance.  Unfortunately, we were unable to capture the new events patient has had during which patient does not feel well, comes to her mother and then has full body shaking.  It is possible that patient has  coexisting nonepileptic events.  Discussed with mother about taking a video of events in future if able to. Patient also reported significant stress.  Therefore we recommend continuing home dose of lamotrigine and Keppra and  started Onfi 5 mg daily with plan to gradually wean off Keppra and increase Onfi to help with mood stabilization.  We also recommended follow-up with psychiatry for anxiety and depression.  Discussed seizure precautions including do not drive.  Recommend follow-up with Dr. April Manson.  ASSESSMENT AND PLAN  26 y.o. year old female  with past medical history of anxiety/depression, generalized epilepsy who is presenting for follow up for management of her generalized epilepsy and additional event concerning for nonepileptic seizures.  While she was on Klonopin, she was doing well, did not have any events but since she ran out she is having an increase in the nonepileptic spells.  I strongly advised her to set up care with a psychiatrist to manage her anxiety.  I have also put her on Atarax but she did not tolerate the medication.  She said that she had an appointment set up with psychiatrist in 2 weeks.  I am going to give her another Klonopin bridge for 1 month and I again strongly advised patient to set up care with psychiatrist for the management of her anxiety.  She voices understanding.  I have told her that I will not be managing her anxiety and she need to seek professional care for that  1. 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 Generalized idiopathic epilepsy and epileptic syndromes, not intractable, without status epilepticus ([place])   3. Anxiety       Patient Instructions  1 month of Klonopin bridge, 0.5 mg twice daily Continue your other medications Follow-up with your psychiatrist as scheduled in 2 weeks Return in 3 months or sooner if worse   Per [facility] statutes, patients with seizures are not allowed to drive until they have been seizure-free for six months.  Other recommendations include using caution when using heavy equipment or power tools. Avoid working on ladders or at heights. Take showers instead of baths.  Do not swim alone.  Ensure the water temperature is not too high on the  home water heater. Do not go swimming alone. Do not lock yourself in a room alone (i.e. bathroom). When caring for infants or small children, sit down when holding, feeding, or changing them to minimize risk of injury to the child in the event you have a seizure. Maintain good sleep hygiene. Avoid alcohol.  Also recommend adequate sleep, hydration, good diet and minimize stress.   During the Seizure  - First, ensure adequate ventilation and place patients on the floor on their left side  Loosen clothing around the neck and ensure the airway is patent. If the patient is clenching the teeth, do not force the mouth open with any object as this can cause severe damage - Remove all items from the surrounding that can be hazardous. The patient may be oblivious to what's happening and may not even know what he or she is doing. If the patient is confused and wandering, either gently guide him/her away and block access to outside areas - Reassure the individual and be comforting - Call 911. In most cases, the seizure ends before EMS arrives. However, there are cases when seizures may last over 3 to 5 minutes. Or the individual may have developed breathing difficulties or severe injuries. If a pregnant patient or a person with diabetes develops  a seizure, it is prudent to call an ambulance. - Finally, if the patient does not regain full consciousness, then call EMS. Most patients will remain confused for about 45 to 90 minutes after a seizure, so you must use judgment in calling for help. - Avoid restraints but make sure the patient is in a bed with padded side rails - Place the individual in a lateral position with the neck slightly flexed; this will help the saliva drain from the mouth and prevent the tongue from falling backward - Remove all nearby furniture and other hazards from the area - Provide verbal assurance as the individual is regaining consciousness - Provide the patient with privacy if  possible - Call for help and start treatment as ordered by the caregiver   After the Seizure (Postictal Stage)  After a seizure, most patients experience confusion, fatigue, muscle pain and/or a headache. Thus, one should permit the individual to sleep. For the next few days, reassurance is essential. Being calm and helping reorient the person is also of importance.  Most seizures are painless and end spontaneously. Seizures are not harmful to others but can lead to complications such as stress on the lungs, brain and the heart. Individuals with prior lung problems may develop labored breathing and respiratory distress.     No orders of the defined types were placed in this encounter.   Meds ordered this encounter  Medications   clonazePAM (KLONOPIN) 0.5 MG tablet    Sig: Take 1 tablet (0.5 mg total) by mouth 2 (two) times daily.    Dispense:  60 tablet    Refill:  0     Return in about 3 months (around 05/25/2022).  I have spent a total of 30 minutes dedicated to this patient today, preparing to see patient, performing a medically appropriate examination and evaluation, ordering tests and/or medications and procedures, and counseling and educating the patient/family/caregiver; independently interpreting result and communicating results to the family/patient/caregiver; and documenting clinical information in the electronic medical record.    Alric Ran, MD 02/22/2022, 1:13 PM  [facility] [address], [place], [place] [address] [phone]

## 2022-02-22 NOTE — Patient Instructions (Signed)
1 month of Klonopin bridge, 0.5 mg twice daily Continue your other medications Follow-up with your psychiatrist as scheduled in 2 weeks Return in 3 months or sooner if worse

## 2022-02-22 NOTE — Telephone Encounter (Signed)
FYI for Dr Teresa Coombs and POD 2, pt has called with concern with increase in seizure activity.  Pt has been scheduled to see Dr Teresa Coombs this morning with a check in of 10:45 for a 11:15 appointment

## 2022-02-24 ENCOUNTER — Other Ambulatory Visit: Payer: Self-pay | Admitting: Neurology

## 2022-02-27 ENCOUNTER — Telehealth: Payer: Self-pay | Admitting: Neurology

## 2022-02-27 NOTE — Telephone Encounter (Signed)
Pt mother [name]) is calling and wants to speak with a nurse about Pt ongoing seizures.  [name] stated that Pt came to appointment to see Dr. Teresa Coombs and he suggest she go see a  psychiatrist. Pt mother said they are in the middle of changing doctors but something has to be done about the seizures before hand. [name] is requesting a nurse give her a call

## 2022-03-06 NOTE — Telephone Encounter (Signed)
Pt mother [name]) is calling upset because she have called before and no one has return her called per [name]). [name] stated her daughter is still having seizures and it messed up no one will return her call. I informed [name] that nurses have 24-48 to return calls.

## 2022-03-07 NOTE — Telephone Encounter (Signed)
Spoke with mother [name], extremely frustrated with me and the care that I have been giving to her daughter Maria Day. Stating that I have "dropped the ball on her daughter" and I am not helping her with her seizures.  I have tried to explain to mother that [name] has both epileptic and nonepileptic seizures and most of the symptoms that she is having (out of body experience, not feeling like her normal self) are nonepileptic and that she needs to follow up with psychiatry, something she has not done. In the past, she had generalized convulsion. Mother is stating that I have not done much to help her daughter, that I have not offered VNS, have not done additional tests and have not made additional changes to her medications.  They are transferring their care to [facility] (Dr. [name])   Dr. Teresa Coombs   Dr. Teresa Coombs

## 2022-03-08 ENCOUNTER — Ambulatory Visit: Payer: Medicare Other | Admitting: Neurology

## 2022-03-20 ENCOUNTER — Other Ambulatory Visit: Payer: Self-pay | Admitting: Neurology

## 2022-03-20 MED ORDER — LACOSAMIDE 100 MG PO TABS: 100.0000 mg | ORAL_TABLET | Freq: Two times a day (BID) | ORAL | 0 refills | Status: DC

## 2022-03-20 NOTE — Progress Notes (Signed)
Spoke with patient, reports that she is still having breakthrough seizures. Will add Vimpat to her regiment, 100 mg BID. She has an appointment with Dr. Delice Lesch 10/4.   Dr. April Manson

## 2022-04-04 ENCOUNTER — Encounter: Payer: Self-pay | Admitting: Neurology

## 2022-04-04 ENCOUNTER — Ambulatory Visit (INDEPENDENT_AMBULATORY_CARE_PROVIDER_SITE_OTHER): Payer: Medicare Other | Admitting: Neurology

## 2022-04-04 VITALS — BP 109/74 | HR 93 | Ht 60.0 in | Wt 171.4 lb

## 2022-04-04 DIAGNOSIS — F419 Anxiety disorder, unspecified: Secondary | ICD-10-CM

## 2022-04-04 DIAGNOSIS — G40309 Generalized idiopathic epilepsy and epileptic syndromes, not intractable, without status epilepticus: Secondary | ICD-10-CM

## 2022-04-04 MED ORDER — LEVETIRACETAM 1000 MG PO TABS: ORAL_TABLET | ORAL | 3 refills | Status: DC

## 2022-04-04 MED ORDER — LEVETIRACETAM 500 MG PO TABS: ORAL_TABLET | ORAL | 3 refills | Status: DC

## 2022-04-04 MED ORDER — CLONAZEPAM 0.5 MG PO TABS: 0.5000 mg | ORAL_TABLET | Freq: Two times a day (BID) | ORAL | 5 refills | Status: DC

## 2022-04-04 MED ORDER — LAMOTRIGINE 200 MG PO TABS: 200.0000 mg | ORAL_TABLET | Freq: Two times a day (BID) | ORAL | 3 refills | Status: DC

## 2022-04-04 MED ORDER — LACOSAMIDE 100 MG PO TABS: 100.0000 mg | ORAL_TABLET | Freq: Two times a day (BID) | ORAL | 5 refills | Status: DC

## 2022-04-04 NOTE — Patient Instructions (Addendum)
Good to see you again.  Start the Lacosamide 100mg  twice a day. If it makes you too dizzy, take 1 tablet every night for 2 weeks to get your body used to it, then increase to 1 tablet twice a day when able  2. Continue Levetiracetam 1000mg  + 500mg  twice a day  3. Continue Lamotrigine 200mg  twice a day  4. Refills sent for the clonazepam 0.5mg  tablet. Do not take it more than twice a day. If you can start reducing dose slowly to once a day or one every other day, it would be best.   5. Check with your pharmacy about doing pillpacks  6. Referral will be sent for Behavioral Health to see a psychiatrist and therapist  7. I will look into services/social worker needed  8. Follow-up in 3 months,call for any changes   Seizure Precautions: 1. If medication has been prescribed for you to prevent seizures, take it exactly as directed.  Do not stop taking the medicine without talking to your doctor first, even if you have not had a seizure in a long time.   2. Avoid activities in which a seizure would cause danger to yourself or to others.  Don't operate dangerous machinery, swim alone, or climb in high or dangerous places, such as on ladders, roofs, or girders.  Do not drive unless your doctor says you may.  3. If you have any warning that you may have a seizure, lay down in a safe place where you can't hurt yourself.    4.  No driving for 6 months from last seizure, as per [facility].   Please refer to the following link on the [place] website for more information: http://[email or web address].org/[email or web address]/driving/drivingu.cfm   5.  Maintain good sleep hygiene. Avoid alcohol.  6.  Notify your neurology if you are planning pregnancy or if you become pregnant.  7.  Contact your doctor if you have any problems that may be related to the medicine you are taking.  8.  Call 911 and bring the patient back to the ED if:        A.  The seizure lasts  longer than 5 minutes.       B.  The patient doesn't awaken shortly after the seizure  C.  The patient has new problems such as difficulty seeing, speaking or moving  D.  The patient was injured during the seizure  E.  The patient has a temperature over 102 F (39C)  F.  The patient vomited and now is having trouble breathing

## 2022-04-04 NOTE — Progress Notes (Unsigned)
NEUROLOGY CONSULTATION NOTE  Maria Day MRN: [id number] DOB: 1996-04-08  Referring provider: Dr. Alric Day Primary care provider: none listed  Reason for consult:  transfer of care for seizures  Dear Dr. April Day:  Thank you for your kind referral of Maria Day for consultation of the above symptoms. Although her history is well known to you, please allow me to reiterate it for the purpose of our medical record. The patient was accompanied to the clinic by [name] and aunt Maria Day who also provide collateral information. Records and images were personally reviewed where available.   HISTORY OF PRESENT ILLNESS: This is a 26 year old right-handed woman with a history of ADD, OSA, and seizures, presenting to establish care. I had previously seen her in 2018 however she then went to [facility] in 2019-2022 for further epilepsy management. She was started on Lamotrigine in 2019. She was seizure-free from 2018 to 08/2020 when she called about a blackout episode with no body jerking, then called to report more seizures with grand mal and staring episodes, Lamotrigine increased to 100-200mg  in 11/2020. Levetiracetam increased to 1000mg  BID in 12/2020. She then saw epileptologist Dr. April Day in 04/2021. They were reporting mild body tremors, inability to respond to verbal stimuli. Routine and Ambulatory EEG were normal in 06/2021. She was admitted to the EMU from February 21-***, 2023 where baseline EEG showed generalized spikes and polyspikes with right frontal predominance, predominantly seen during sleep. Seizure medications were discontinued and 2 events were captured. The first seizure was on 2/21, she was initially off camera for the first minute, then she was seen spinning in counterclockwise direction, left arm elevated with subtle jerking, then fell. Left arm and leg were extended, right arm flexed and adductor, leg flexed and abducted followed by figure-of-four posturing  with left arm extension and right arm flexion. Onset showed generalized and maximal right frotnal spike followed by 6-7 Hz theta activity which appeared generalized and maximal in right hemisphere. The second seizure was on 2/23, after about 15 seconds of EEG seizure, she had left upper extremity flexed, elevated, and abducted with patient turning to the left and forced left gaze deviation. She was holding something in the right arm which was flexed and adducted, then she had figure-of-four posturing with left upper extremity extension and right upper extremity flexion, followed by convulsion. EEG onset showed similar changes to prior seizure. 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 She was discharged home on Levetiracetam 1500mg  BID, Lamotrigine 200mg  BID, and Clobazam 10mg  daily was added to her regimen. On her follow-up with Dr. April Day in 11/2021, she continued to report out of body phenomenon and feeling like she would have a seizure,a s well as lethargy/fatigue on Clobazam. Symptoms were felt due to anxiety as she is under a lot of stress, and clonazepam 0.5mg  BID was started. She did well on the clonazepam but when she Day out, she was started on Hydroxyzine, which made her feel weird. She felt being off clonazepam led to increase in seizure-like activity. She was advised to follow-up with Psychiatry but has not been able to. She saw her psychiatrist in [place]. [name] who "agreed with me that Fluoxetine is not working," she patient self-discontinued it a month ago. She states the clonazepam really helps calm her anxiety, when she dose not take it, anxiety is almost uncontrollable. She states that she has "very, very, very bad  anxiety," and has not felt like herself in a very long time. She feels like she is an alien in her body.     Having breakthrough sz, starts getting nauseated, crunches down, shakes but not  violent; since then the breakthrough sz have stopped since the testing but the GTCs have come back, 5mins foaming at mouth breathing is bad Left hand steens up then wakes up on the floor, used to not do that [place] at beach, sitting on couch, looked like she would grab on the left side and tries to circle and falls out, left arm lifted up Scared to go anywhere Lost 2 jobs, on disailit Last sz was 2 weeks ago, getting something out of dryer, hthen left hand started stiffen up, then woke up on the floor, told she was foaming at mouth, eyes rolled back, lasted 5mins; has not bit tongue in a while, no incont Nauseated and weak after, no focal weakness Vomited The one at the beach, threw up and had horrible HA HAs have gotten better, more after a sz; yesterday had a sick HA with nausea no vomiting Wearing sunglasses After testing, does not want to go anywhere (Feb) Sleeps al day long even if sleeping at night Very very very bad anxiety, have not felt like myself in a very long time, constantly thiks she will have a sz  Left hand shakes from time to time, left arm is new, not with her prior sz Walks around in a circle with left arm flexed up then falls; face and leg unaffected Recently moved back with mom, prev back and forth in apt alone but scared and dad's house   This is an 26 year old left-handed woman with a history of ADD and 3 seizures, presenting to establish care. Her mother reports the first seizure was at age 13 in the setting of a high fever, she had a convulsion in the car seat that lasted at least 5 minutes. She did well until age [age] when she had a convulsion in the bathroom in January 2015. She was getting ready for school and recalls ironing her hair, then her mother heard her fall back and hit her head on the tub. She denied any warning symptoms prior to the GTC, she had a headache and vomiting after and was diagnosed with a concussion. She started seeing pediatric neurologist Dr.  Sharene Day, initial EEG showed diffuse post-ictal slowing. She also reported symptoms suggestive of myoclonic jerks where she would have a jerk in her left hand and drop things, she calls them "flinches," where she would be holding a pencil or bottle then lose control of her hand. They can occur at any time, she does not recall having them prior to the GTC. She had a head CT in 2015 which was unremarkable. Repeat sleep-deprived wake EEG was normal. She was started on Keppra, however called several times about mood and personality changes, as well as weight gain. Her mother reports an almost 40-lb weight gain in 4 months. She was given instructions to taper off Keppra and start Zonisamide, but it appears she was lost to follow-up and did not start the medication. She was back in the ER last 10/08/16, she states she had a seizure in her sleep, she fell and hit her head on the dresser. Her mother thinks she was already awake, the episode occurred around 6am. She reports being a little stressed recently with her brother's passing. She denies any sleep deprivation. She had a headache the  night prior, but it was no different from her usual headaches. Her mother heard her fall and again found her having a convulsion that lasted a few minutes. She was wide-eyed and confused after, no focal weakness. She was brought to [facility] ER where bloodwork was unremarkable, she was discharged home on Keppra 500mg  BID which she has been overall tolerating.   She reports the "flinches" are not frequent, occurring more when she is under a lot of stress. She denies any gaps in time, but sometimes feels like "I am not here, not in my body sort of thing." Last episode was a week ago. She was having them more at school, around once a week. Her mother states she occasionally gets fixated on something but can be distracted. She denies any olfactory/gustatory hallucinations, deja vu, rising epigastric sensation, focal numbness/tingling/weakness. She  has headaches occurring around  1-2 times a week, usually on the left side, making her feel nauseated with sensitivity to lights and sounds. She usually gets very ill and grumpy and would sleep it off. She occasionally takes aspirin. When her mother tells her that she should tell her about these, she states "you just don't pay attention." She had struggled with some learning issues growing up and did a vocational program, she will be seeing a tomorrow to "determine if she has autism." She takes Concerta to help with focusing at school, since stopping it now that she has graduated, she is not having as much headaches. Her mother thinks she had more headaches when she would not eat because Concerta made her lose appetite. She has occasional dizziness "like I'm not here." She denies any diplopia, dysarthria/dysphagia, neck/back pain, bowel/bladder dysfunction. She can get aggravated easily, and states that her depression plays a role with her anxiety. Prozac helps, she can tell a big difference if she does not take her medication. She had a kidney stone in February 2018 that she passed the next day. She graduated high school last Wednesday and plans to work. She is not driving yet.    Epilepsy Risk Factors:  She had a febrile seizure at age 33. Her mother reports there was a "knot in the umbilical cord" and meconium in amniotic fluid, she needed antibiotics for a few days. Otherwise she had a normal early development.  There is no history of CNS infections such as meningitis/encephalitis, significant traumatic brain injury, neurosurgical procedures, or family history of seizures.   Prior AEDs: Keppra  .  3, mom Iva Boop  Did not start LCS  First sz at age 89, prolonged; next sz was in middle school Dr. 2 Has had troubles in her life processing issues, IQ53, family concerned about not living on her own and make decisions to live on her own Reminders even to brush teeth  Brisk  +3 with hoffman sign bilat, no clonus  12/2020: History of mild IDD, moderate OSA not on CPAP (couldn't tolerate in past), hx of nephrolithiasis, depression/anxiety with hx of suicidal ideation, and migraines.  Epilepsy hx:  Had 1 febrile convulsive seizure at age 82 that was prolonged, lasting 1 hr.Was seizure free till age 47 when she had a convulsion in the bath tub, fell and hit her head and had post concussion syndrom. She was started on keppra 500mg  for 1 year and discontinued due to 40lbs weight gain. Had 2 other seizures after that, 1 at age 43 occurring in sleep, fell of the bed and hit her head on the dressor. Mother found her  convulsing. This might have been triggered by stress. She was restarted on keppra  bid. The other seizure was the last one in Jul 01 2017: she was eating dinner, witnessed by her father and step mum, had upward extension of the RUE then right head and eye deviation followed by generalized convulsions.Larey Seat and hit her shoulder. Lasts 20 mins. Was associated with foaming,tongue biting, post ictal vomiting and headache. She has missed her keppra dosage.   Other semiologies described in prior visits:  staring spells  2-3times per day lasting 3-5 mins. She would be unresponsive. No automatisms or facial twitching. Patient denies such episodes. Today states she would stare off but can be distracted and is responsive and aware. Prior neurologist's note mentions myoclonic jerks and dropping objects..    8/24: generalzied epilepsy LEV  BID LTG  BID she did not have the new events but had 2 generalized seizures.  Since then she continued to have anxiety about having another seizure, dizziness, and out of body experience.  6/5: In May, while in vacation she had two generalized seizures.  At that time we increased her Keppra to 1500 mg twice daily.  She was also on clobazam 10 mg nightly and lamotrigine 200 mg twice daily but patient reports she was taking an extra  100 mg twice daily of Lamictal.  She contacted the office last week, complaining of feeling drunk, lightheaded, dizzy and plan was to discontinue the additional 100 mg of Lamictal.  Since patient discontinued the additional dose, she reported her dizziness and lightheadedness of feeling drunk improved.  Currently she is on clobazam 10 mg that she take in the morning, lamotrigine 200 mg twice daily and levetiracetam 1500 mg twice daily, denies any generalized seizure but continues to experience out of body phenomenon, feels like I am going to have a seizure.  Per mother patient had a first seizure at the age of 4 in the setting of illness and fever, work-up at that time was unrevealling.  She was doing fine until the age of 74 where she had a second seizure.  At that time she was put on Keppra but they report patient having seizures every 2 years.  Keppra has been uptitrated, lamotrigine added and her last seizure was in 2019.  Her seizure was described as eyes looking up, rolled back ,she will have stiffness and this would last about 1 minute followed by postictal confusion.  No additional seizure like that since 2019.   Patient now describe a second type of seizure described as not feeling well, feels weak, will get dizzy and she had to sit down and will have minor shaking.  This lasts between 1 to 2 minutes and no postictal confusion.  She reported 3 weeks ago she had a cluster of 3 seizures, and this was in the setting of high stress.  Previously she was living with her father but due to friction between patient and father she had moved back to her mom's house in the past 3 years.  She said that the second type of seizure frequency increase due to high stress.   She has seen multiple neurologist/epileptologists for her seizure management.  Seizure risk factors: Febrile sz at the age 38, meconium staining, stay in couple days with IV, knot in umbilical cord, low apgar score  Brain Images: Normal brain MRI  2018   Previous EEGs: 2EEG 2018 This 1-hour awake and asleep EEG is abnormal due to the presence of occasional generalized irregular high voltage  4-5 Hz spike and polyspike and wave discharges, some with right-sided predominance.   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 The right-sided predominance of some epileptiform discharges also raises the possibility of focal seizures with secondary bisynchrony  Routine EEG 06/30/2021 This is a normal routine EEG. A normal EEG does not exclude a diagnosis of epilepsy.   Ambulatory EEG 06/30/2021 This is a normal video ambulatory EEG. There was one event of seeing flashes of light when closing eyes with no changes in the EEG background. A normal EEG does not exclude a diagnosis of epilepsy.     EMU Admission:  Hospital Course: Ms. Longo was admitted to epilepsy monitoring unit from 08/21/2021 to 08/25/2021.  During this.,  Patient went continuous video EEG monitoring, had hyperventilation, photic stimulation as well as her antiepileptics were discontinued.  We captured 2 events which were consistent with seizures, with generalized onset.  However, focal epilepsy from right frontal region could also have similar appearance.  Unfortunately, we were unable to capture the new events patient has had during which patient does not feel well, comes to her mother and then has full body shaking.  It is possible that patient has coexisting nonepileptic events.  Discussed with mother about taking a video of events in future if able to. Patient also reported significant stress.  Therefore we recommend continuing home dose of lamotrigine and Keppra and started Onfi 5 mg daily with plan to gradually wean off Keppra and increase Onfi to help with mood stabilization Seizure symptoms: The patient denies any olfactory/gustatory hallucinations, deja vu, rising epigastric sensation, focal numbness/tingling/weakness, myoclonic  jerks.  Epilepsy Risk Factors:  *** had a normal birth and early development.  There is no history of febrile convulsions, CNS infections such as meningitis/encephalitis, significant traumatic brain injury, neurosurgical procedures, or family history of seizures.  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 Normal early development. Had learning disabilities at school and attended a vocational program. Has a high school diploma. Had psychoeducational testing that revealed an IQ of 49. Genetic testing was normal.  Prior AEDs: TPM Laboratory Data:  EEGs: MRI:   PAST MEDICAL HISTORY: Past Medical History:  Diagnosis Date   ADHD (attention deficit hyperactivity disorder)    Cognitive impairment    Myoclonus    Reactive depression    Seizures (HCC)     PAST SURGICAL HISTORY: History reviewed. No pertinent surgical history.  MEDICATIONS: Current Outpatient Medications on File Prior to Visit  Medication Sig Dispense Refill   clonazePAM (KLONOPIN) 0.5 MG tablet Take 1 tablet (0.5 mg total) by mouth 2 (two) times daily. 60 tablet 0   lamoTRIgine (LAMICTAL) 200 MG tablet TAKE 1 TABLET BY MOUTH TWICE A DAY 180 tablet 1   levETIRAcetam (KEPPRA) 1000 MG tablet Take 1 tablet (1,000 mg total) by mouth 2 (two) times daily. Take with 500 mg tablet for a total of 1500 mg twice a day 180 tablet 1   levETIRAcetam (KEPPRA) 500 MG tablet Take 1 tablet (500 mg total) by mouth 2 (two) times daily. Take in addition to 1000 mg tablet for a total of 1500 mg twice a day 60 tablet 3   Lacosamide 100 MG TABS Take 1 tablet (100 mg total) by mouth in the morning and at bedtime. (Patient not taking: Reported on 04/04/2022) 60 tablet 0   No current facility-administered medications on  file prior to visit.    ALLERGIES: Allergies  Allergen Reactions   Penicillins Rash   Cinnamon Swelling    Amoxicillin Rash    FAMILY HISTORY: Family History  Problem Relation Age of Onset   Cancer Father        Leukemia   Hyperlipidemia Maternal Grandfather    Diabetes Paternal Grandfather     SOCIAL HISTORY: Social History   Socioeconomic History   Marital status: Single    Spouse name: Not on file   Number of children: Not on file   Years of education: Not on file   Highest education level: Not on file  Occupational History   Not on file  Tobacco Use   Smoking status: Former    Types: Cigarettes   Smokeless tobacco: Never  Vaping Use   Vaping Use: Never used  Substance and Sexual Activity   Alcohol use: No   Drug use: Yes    Types: Marijuana   Sexual activity: Never  Other Topics Concern   Not on file  Social History Narrative   Left handed    Social Determinants of Health   Financial Resource Strain: Not on file  Food Insecurity: Not on file  Transportation Needs: Not on file  Physical Activity: Not on file  Stress: Not on file  Social Connections: Not on file  Intimate Partner Violence: Not on file     PHYSICAL EXAM: Vitals:   04/04/22 1037  BP: 109/74  Day: 93  SpO2: 98%   General: No acute distress Head:  Normocephalic/atraumatic Skin/Extremities: No rash, no edema Neurological Exam: Mental status: alert and oriented to person, place, and time, no dysarthria or aphasia, Fund of knowledge is appropriate.  Recent and remote memory are intact.  Attention and concentration are normal.    Able to name objects and repeat phrases. Cranial nerves: CN I: not tested CN II: pupils equal, round and reactive to light, visual fields intact CN III, IV, VI:  full range of motion, no nystagmus, no ptosis CN V: facial sensation intact CN VII: upper and lower face symmetric CN VIII: hearing intact to conversation Bulk & Tone: normal, no fasciculations. Motor: 5/5 throughout with no pronator drift. Sensation: intact to light touch, cold, pin, vibration and  joint position sense.  No extinction to double simultaneous stimulation.  Romberg test *** Deep Tendon Reflexes: +2 throughout, no ankle clonus Plantar responses: downgoing bilaterally Cerebellar: no incoordination on finger to nose, heel to shin. No dysdiadochokinesia Gait: narrow-based and steady, able to tandem walk adequately. Tremor: ***   IMPRESSION: This is a *** year old ***-handed *** with a history of ***.  [place] driving laws were discussed with the patient, and *** knows to stop driving after a seizure, until 6 months seizure-free.    The duration of this appointment visit was *** minutes of face-to-face time with the patient.  Greater than 50% of this time was spent in counseling, explanation of diagnosis, planning of further management, and coordination of care.  Thank you for allowing me to participate in the care of this patient. Please do not hesitate to call for any questions or concerns.   Patrcia Dolly, M.D.  CC: ***

## 2022-04-10 ENCOUNTER — Telehealth: Payer: Self-pay | Admitting: Anesthesiology

## 2022-04-10 ENCOUNTER — Other Ambulatory Visit: Payer: Self-pay

## 2022-04-10 DIAGNOSIS — G40309 Generalized idiopathic epilepsy and epileptic syndromes, not intractable, without status epilepticus: Secondary | ICD-10-CM

## 2022-04-10 NOTE — Telephone Encounter (Signed)
Patient requesting a call back has questions about her medication(did not say name of med) and also questions about seizures.

## 2022-04-10 NOTE — Telephone Encounter (Signed)
Needs her lacosamide sent to [facility] on [address] so she can use good rx card to get it cheaper,  Pt stated that she is dizzy, not feel well, not wanting to leave the house and has some concerns she is asking if Dr Delice Lesch can call her,

## 2022-04-11 ENCOUNTER — Other Ambulatory Visit (HOSPITAL_COMMUNITY): Payer: Self-pay

## 2022-04-11 NOTE — Telephone Encounter (Signed)
Pt called in stating she missed a call from Dr. Delice Lesch yesterday and was hoping to speak with her today

## 2022-04-11 NOTE — Telephone Encounter (Signed)
Spoke to patient. She made an appointment with Psychiatrist. She went to pharmacy and quoted $200. She is feeling awful, does not want to go anywhere, stays very dizzy, does not feel like she is here. Does not improve when she takes the clonazepam. Proceed with MRI and Behavioral Health. Discussed that the alien feeling is most likely psychiatric, proceed with [facility] appointments.   Heather, can you pls check on why cost with Medicare for Lacosamide is quoted like that, does she need  a PA? Thanks!

## 2022-04-11 NOTE — Telephone Encounter (Signed)
Need updated information for patient. Last insurance on file was scanned into system in February 2023 [facility]) and when run a test claim, shows that this insurance coverage expired 12-29-2021. The price she was quoted was probably a Chief Technology Officer.

## 2022-04-11 NOTE — Telephone Encounter (Signed)
Pt called advised to use the good RX card at [facility] and she can use it more than once and that we are working to see if her medication needs a PA

## 2022-04-12 NOTE — Telephone Encounter (Signed)
The Medicare card from 12-04-21 is her Part A and B coverage. We would need a Part D card for rx coverage.

## 2022-04-23 ENCOUNTER — Other Ambulatory Visit (HOSPITAL_COMMUNITY): Payer: Self-pay

## 2022-04-23 ENCOUNTER — Telehealth: Payer: Self-pay | Admitting: Anesthesiology

## 2022-04-23 NOTE — Telephone Encounter (Signed)
Patient's mother called to ask about the referral for Behavioral Health and MRI her daughter was to be scheduled for. She has not heard from either one. Her daughter's appointment with Dr Delice Lesch was on 04/04/2022. Patient's mother requests call back.

## 2022-04-24 ENCOUNTER — Encounter: Payer: Self-pay | Admitting: Neurology

## 2022-04-24 ENCOUNTER — Telehealth: Payer: Self-pay | Admitting: Neurology

## 2022-04-24 ENCOUNTER — Other Ambulatory Visit: Payer: Self-pay

## 2022-04-24 DIAGNOSIS — G40309 Generalized idiopathic epilepsy and epileptic syndromes, not intractable, without status epilepticus: Secondary | ICD-10-CM

## 2022-04-24 NOTE — Telephone Encounter (Signed)
Pt mother called no answer left a voice mail to call the office back when she calls the pt back pt is scheduled 04/30/2022  9:00 AM at [facility] and she needs to call [facility] at [phone] to get MRI scheduled,

## 2022-04-24 NOTE — Telephone Encounter (Signed)
Left message with after hour service on 04-24-22 at 12:50 pm    Caller states needs to discuss a schedule MRI  patient was not notified and does not know where

## 2022-04-24 NOTE — Telephone Encounter (Signed)
Pt is going to call an get scheduled for MRI, They are also asking about VNS she said that is is thinking about getting that, and they want to know what you think about it,

## 2022-04-24 NOTE — Telephone Encounter (Signed)
New order was faxed to [facility] so that they can get the pt scheduled for the MRI. Pt mother was rude on the phone stated she should not be doing my job I told she is not doing my job it looks like she is scheduled when she is not and that I will send over another order to get it fixed, she stated that its frustrating when people do not call, they have the [facility] referral appointment already,

## 2022-04-30 ENCOUNTER — Encounter (HOSPITAL_COMMUNITY): Payer: Self-pay | Admitting: Psychiatry

## 2022-04-30 ENCOUNTER — Ambulatory Visit (HOSPITAL_BASED_OUTPATIENT_CLINIC_OR_DEPARTMENT_OTHER): Payer: Medicare Other | Admitting: Psychiatry

## 2022-04-30 VITALS — Wt 171.0 lb

## 2022-04-30 DIAGNOSIS — G40309 Generalized idiopathic epilepsy and epileptic syndromes, not intractable, without status epilepticus: Secondary | ICD-10-CM | POA: Diagnosis not present

## 2022-04-30 DIAGNOSIS — F0630 Mood disorder due to known physiological condition, unspecified: Secondary | ICD-10-CM

## 2022-04-30 DIAGNOSIS — F1210 Cannabis abuse, uncomplicated: Secondary | ICD-10-CM | POA: Diagnosis not present

## 2022-04-30 DIAGNOSIS — F411 Generalized anxiety disorder: Secondary | ICD-10-CM

## 2022-04-30 NOTE — Progress Notes (Addendum)
[facility] Initial Assessment Note  Patient Location:Home Provider Location:Home Office   I connected with Maria Day by video and verified that I am talking with correct person using two identifiers.   I discussed the limitations, risks, security and privacy concerns of performing an evaluation and management service virtually and the availability of in person appointments. I also discussed with the patient that there may be a patient responsible charge related to this service. The patient expressed understanding and agreed to proceed.  Maria Day [id number] 26 y.o.  04/30/2022 9:04 AM  GAD (generalized anxiety disorder)  Generalized idiopathic epilepsy and epileptic syndromes, not intractable, without status epilepticus (HCC) - Plan: MR BRAIN W WO CONTRAST  Mood disorder in conditions classified elsewhere  Mild tetrahydrocannabinol (THC) abuse   Chief Complaint:  Referred from my neurologist.  I have a lot of anxiety.  I feel I have an alien in my body.  I feel prisoner to myself.  History of Present Illness:  Patient is 26 year old Caucasian, unemployed, single female who is referred from her neurologist for the management of anxiety.  Patient reported long history of seizures and recently seen her neurologist for medication adjustment.  Patient reported having severe anxiety leaving her place because she is afraid she may have.  She is terrified to go to the stores and recalled having these episodes while she was in [place], [employer] and other stores.  She feels very worried, overwhelmed and does not leave the house.  She feels that she is a prisoner in the house.  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  She is currently on disability because of her medical reason.  Patient also reported having fatigue, tired, dizziness and some time too much sedation.  She feels her sleep is mixed up.   She endorsed having crying spells and feels hopeless about her life.  She was living with a roommate but now she is staying at her mother because she does not feel safe and want to have someone monitor.  Her mother also present in the session who reported patient has a history of impulsive behavior, excessive buying and recently she received more than 25,000 back money for her disability which she spent $10,000 on the car and rest of the money is gone.  She reported feeling easily irritable, agitated and impulsive.  Patient admitted having the arguments with the mother and meditated her mood swing, being emotional but denies any hallucination, suicidal thoughts.  Her biggest concern is having another seizure.  She is on a moderate dose of Lamictal, Klonopin, Keppra and lacosamide but she has not picked up lacosamide because of co-pay and insurance issues.  Patient denies any nightmares, flashback or history of abuse.  She admitted to smoking marijuana 2-3 times a week.  She denies any anhedonia or any guilt.  She described her mood sometime anxious and labile because she worried about her seizures.  She denies any OCD, panic attacks.  As per mother she saw a psychiatrist in her school age for ADHD and oppositional defiant disorder.  She was prescribed Risperdal which worked very well but she had lactation.  In the past she had seen therapist but currently not following with any therapy. Her GAD 19.  Past Psychiatric History: History of learning disorder, ADHD, oppositional defiant disorder.  She had psychological testing in the past with IQ around 74.  She struggle with  grades in the school however with the help of vocational rehab she finished high school with good grades.  She saw a psychiatrist and prescribed Concerta for ADD and Risperdal for mood symptoms.  No history of suicidal attempt, inpatient or abuse.   Family History  Problem Relation Age of Onset   Cancer Father        Leukemia    Hyperlipidemia Maternal Grandfather    Diabetes Paternal Grandfather      Past Medical History:  Diagnosis Date   ADHD (attention deficit hyperactivity disorder)    Cognitive impairment    Myoclonus    Reactive depression    Seizures ([place])      Traumatic Head Injury: Denies any history of traumatic brain injury.  Work History; Had worked in the past but due to seizure it was not continued.  Psychosocial History; Patient reported parents are separated at early age.  Mother had history of depression.  Grandmother committed suicide and brother has history of drug overdose.  Sister has bipolar and schizophrenia.  As per the patient's mother, patient's father had bipolar disorder.  As per mother patient had history of lying, behavior problem and seen therapist and psychiatrist in early age.  Legal History; Patient denies any legal issues.  History Of Abuse; Patient denies any history of abuse.  Substance Abuse History; History of cannabis use.  Neurologic: Headache: Yes Seizure: Yes Paresthesias: No   Outpatient Encounter Medications as of 04/30/2022  Medication Sig   clonazePAM (KLONOPIN) 0.5 MG tablet Take 1 tablet (0.5 mg total) by mouth 2 (two) times daily.   Lacosamide 100 MG TABS Take 1 tablet (100 mg total) by mouth in the morning and at bedtime.   lamoTRIgine (LAMICTAL) 200 MG tablet Take 1 tablet (200 mg total) by mouth 2 (two) times daily.   levETIRAcetam (KEPPRA) 1000 MG tablet Take 1 tablet twice a day (Take with 500 mg tablet for a total of 1500 mg twice a day)   levETIRAcetam (KEPPRA) 500 MG tablet Take 1 tablet twice a day (Take with 1000mg  tablet for a total of 1500mg  twice a day).   No facility-administered encounter medications on file as of 04/30/2022.    No results found for this or any previous visit (from the past 2160 hour(s)).    Constitutional:  Wt 171 lb (77.6 kg)   BMI 33.40 kg/m    Musculoskeletal: Strength & Muscle Tone: within  normal limits Gait & Station: normal Patient leans: N/A  Psychiatric Specialty Exam: Physical Exam  Review of Systems  Constitutional:        Tired, fatigue  Neurological:  Positive for dizziness.  Psychiatric/Behavioral:  The patient is nervous/anxious.     Weight 171 lb (77.6 kg).There is no height or weight on file to calculate BMI.  General Appearance: Casual  Eye Contact:  Fair  Speech:  Normal Rate  Volume:  Normal  Mood:  Anxious and Dysphoric  Affect:  Constricted  Thought Process:  Descriptions of Associations: Intact  Orientation:  Full (Time, Place, and Person)  Thought Content:  Rumination  Suicidal Thoughts:  No  Homicidal Thoughts:  No  Memory:  Immediate;   Good Recent;   Fair Remote;   Fair  Judgement:  Fair  Insight:  Shallow  Psychomotor Activity:  Normal  Concentration:  Concentration: Good and Attention Span: Fair  Recall:  Good  Fund of Knowledge:  Good  Language:  Good  Akathisia:  No  Handed:  Right  AIMS (if indicated):  Assets:  Communication Skills Desire for Improvement Housing Social Support  ADL's:  Intact  Cognition:  WNL  Sleep:   too much     Assessment/Plan:  Patient is 26 year old Caucasian unemployed female.  I review medical history, current medication, blood work results and psychosocial information.  Discuss her anxiety symptoms and mood symptoms.  We talk about strong family history of mental disorder.  We also discussed about use of cannabis and using benzodiazepine.  Mother's concern being dependent on benzodiazepine.  Patient's main concern is anxiety related to her seizures.  Patient is complaining about excessive sedation, fatigue, dizziness and I discussed it could be due to combination of multiple medication that she is taking which usually takes some time to get adjusted in the body.  I recommend try gabapentin 100 mg up to 3 times a day to help with anxiety which is also helpful for her seizures.  Recommend to  discontinue or at least cut down the Klonopin which is prescribed by neurology and I also recommend to stop the cannabis due to interaction with psychotropic medication and seizure medication.  I do believe patient need a therapist to help her coping skills and to help her anxiety and vocational rehab to help going back to work.  Currently she is not driving with the fear of seizures.  She is scheduled to have MRI ordered by neurologist.  We discussed safety concerns that anytime having active suicidal thoughts or homicidal thought then she need to call 911 or go to local emergency room.  Cleotis Nipper, MD 04/30/2022    Follow Up Instructions: I discussed the assessment and treatment plan with the patient. The patient was provided an opportunity to ask questions and all were answered. The patient agreed with the plan and demonstrated an understanding of the instructions.   The patient was advised to call back or seek an in-person evaluation if the symptoms worsen or if the condition fails to improve as anticipated.   Collaboration of Care: Primary Care Provider AEB notes are available in epic to review.   Patient/Guardian was advised Release of Information must be obtained prior to any record release in order to collaborate their care with an outside provider. Patient/Guardian was advised if they have not already done so to contact the registration department to sign all necessary forms in order for [place] to release information regarding their care.    Consent: Patient/Guardian gives verbal consent for treatment and assignment of benefits for services provided during this visit. Patient/Guardian expressed understanding and agreed to proceed.     I provided 70 minutes of non-face-to-face time during this encounter.

## 2022-05-14 ENCOUNTER — Ambulatory Visit
Admission: RE | Admit: 2022-05-14 | Discharge: 2022-05-14 | Disposition: A | Discharge status: Home / Self Care | Payer: Medicare Other | Source: Ambulatory Visit | Attending: Neurology | Admitting: Neurology

## 2022-05-14 DIAGNOSIS — G40309 Generalized idiopathic epilepsy and epileptic syndromes, not intractable, without status epilepticus: Secondary | ICD-10-CM

## 2022-05-14 DIAGNOSIS — R569 Unspecified convulsions: Secondary | ICD-10-CM | POA: Diagnosis not present

## 2022-05-14 MED ORDER — GADOPICLENOL 0.5 MMOL/ML IV SOLN
7.5000 mL | Freq: Once | INTRAVENOUS | Status: AC | PRN
  Administered 2022-05-14: 7.5 mL via INTRAVENOUS

## 2022-05-16 ENCOUNTER — Telehealth (INDEPENDENT_AMBULATORY_CARE_PROVIDER_SITE_OTHER): Payer: Medicare Other | Admitting: Neurology

## 2022-05-16 ENCOUNTER — Encounter: Payer: Self-pay | Admitting: Neurology

## 2022-05-16 VITALS — Ht 60.0 in | Wt 157.0 lb

## 2022-05-16 DIAGNOSIS — F419 Anxiety disorder, unspecified: Secondary | ICD-10-CM | POA: Diagnosis not present

## 2022-05-16 DIAGNOSIS — G40309 Generalized idiopathic epilepsy and epileptic syndromes, not intractable, without status epilepticus: Secondary | ICD-10-CM

## 2022-05-16 NOTE — Patient Instructions (Signed)
Good to see you.  Schedule 2-day home EEG  2. Continue current medications  3. Follow-up with Psychiatry as scheduled  4. Follow-up after EEG, call for any changes   Seizure Precautions: 1. If medication has been prescribed for you to prevent seizures, take it exactly as directed.  Do not stop taking the medicine without talking to your doctor first, even if you have not had a seizure in a long time.   2. Avoid activities in which a seizure would cause danger to yourself or to others.  Don't operate dangerous machinery, swim alone, or climb in high or dangerous places, such as on ladders, roofs, or girders.  Do not drive unless your doctor says you may.  3. If you have any warning that you may have a seizure, lay down in a safe place where you can't hurt yourself.    4.  No driving for 6 months from last seizure, as per [facility].   Please refer to the following link on the Epilepsy Foundation of America's website for more information: http://www.epilepsyfoundation.org/answerplace/Social/driving/drivingu.cfm   5.  Maintain good sleep hygiene. Avoid alcohol.  6.  Notify your neurology if you are planning pregnancy or if you become pregnant.  7.  Contact your doctor if you have any problems that may be related to the medicine you are taking.  8.  Call 911 and bring the patient back to the ED if:        A.  The seizure lasts longer than 5 minutes.       B.  The patient doesn't awaken shortly after the seizure  C.  The patient has new problems such as difficulty seeing, speaking or moving  D.  The patient was injured during the seizure  E.  The patient has a temperature over 102 F (39C)  F.  The patient vomited and now is having trouble breathing

## 2022-05-16 NOTE — Progress Notes (Signed)
Virtual Visit via Video Note The purpose of this virtual visit is to provide medical care while limiting exposure to the novel coronavirus.    Consent was obtained for video visit:  Yes.   Answered questions that patient had about telehealth interaction:  Yes.   I discussed the limitations, risks, security and privacy concerns of performing an evaluation and management service by telemedicine. I also discussed with the patient that there may be a patient responsible charge related to this service. The patient expressed understanding and agreed to proceed.  Pt location: Home Physician Location: office Name of referring provider:  No ref. provider found I connected with [name] at patients initiation/request on 05/16/2022 at  3:00 PM EST by video enabled telemedicine application and verified that I am speaking with the correct person using two identifiers. Pt MRN:  [id number] Pt DOB:  06-Dec-1995 Video Participants:  [name]   History of Present Illness:  The patient had a virtual video visit on 05/16/2022. She was last seen in the office 6 weeks ago for seizures. She presents for an earlier visit to discuss brain MRI results and continued symptoms. I personally reviewed brain MRI with and without contrast done 05/14/2022 which was normal, hippocampi symmetric with no abnormal signal or enhancement seen. She reports side effects on the Lacosamide, she took it for a few days and kept feeling like her "brain was coming out of my forehead." It was hard to close her eyes. She stopped the Lacosamide and symptoms resolved. She continues on Levetiracetam 1500mg  BID (1000+500mg  BID) and Lamotrigine 200mg  BID. She has daily episodes especially at night where she feels very dizzy like she will have a seizure. She had previously cut down on clonazepam to every other day, however in the past week went back to taking it BID with no improvement in these symptoms. Her last typical seizure was 2 months  ago. She just does not feel good at all. She has not been going into stores, very terrified to go into places. She has seen Dr. and Gabapentin was recommended however she has not received the prescription. She has not heard back from the psychotherapist to schedule an appointment, she will be seeing Dr. again this month.    History on Initial Assessment 04/04/2022: This is a 26 year old right-handed woman with a history of ADD, OSA, and seizures, presenting to establish care. I had previously seen her in 2018 however she then went to [facility] in 2019-2022 for further epilepsy management. She was started on Lamotrigine in 2019. She was seizure-free from 2018 to 08/2020 when she called about a blackout episode with no body jerking, then called to report more seizures with grand mal and staring episodes, Lamotrigine increased to 100-200mg  in 11/2020. Levetiracetam increased to 1000mg  BID in 12/2020. She then saw epileptologist Dr. 12/2020 in 04/2021. They were reporting mild body tremors, inability to respond to verbal stimuli. Routine and Ambulatory EEG were normal in 06/2021. She was admitted to the EMU from February 20-24, 2023 where baseline EEG showed generalized spikes and polyspikes with right frontal predominance, predominantly seen during sleep. Seizure medications were discontinued and 2 events were captured. The first seizure was on 2/21, she was initially off camera for the first minute, then she was seen spinning in counterclockwise direction, left arm elevated with subtle jerking, then fell. Left arm and leg were extended, right arm flexed and adductor, leg flexed and abducted followed by figure-of-four posturing with left arm  extension and right arm flexion. Onset showed generalized and maximal right frontal spike followed by 6-7 Hz theta activity which appeared generalized and maximal in right hemisphere. The second seizure was on 2/23, after about 15 seconds of EEG seizure, she  had left upper extremity flexed, elevated, and abducted with patient turning to the left and forced left gaze deviation. She was holding something in the right arm which was flexed and adducted, then she had figure-of-four posturing with left upper extremity extension and right upper extremity flexion, followed by convulsion. EEG onset showed similar changes to prior seizure. 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 She was discharged home on Levetiracetam 1500mg  BID, Lamotrigine 200mg  BID, and Clobazam 10mg  daily was added to her regimen. On her follow-up with Dr. Teresa Coombsamara in 11/2021, she continued to report out of body phenomenon and feeling like she would have a seizure, as well as lethargy/fatigue on Clobazam. Symptoms were felt due to anxiety as she is under a lot of stress, and clonazepam 0.5mg  BID was started. She did well on the clonazepam but when she ran out, she was started on Hydroxyzine, which made her feel weird. She was prescribed Lacosamide but did not start it. She felt being off clonazepam led to increase in seizure-like activity. She was advised to follow-up with Psychiatry but has not been able to. She saw [place]CP in [name] who "agreed with me that Fluoxetine is not working," she patient self-discontinued it a month ago. She states the clonazepam really helps calm her anxiety, when she dose not take it, anxiety is almost uncontrollable. She states that she has "very, very, very bad anxiety," and has not felt like herself in a very long time. She feels like she is an alien in her body. She is scared to go out of the house, which is new since February.   Her mother reports that the episodes where she would get nauseated, crunch down and shake (not violently) had stopped since February, however her convulsive seizures have recurred where she is foaming at the mouth and "breathing bad." She states her  left hand would stiffen up, then she wakes up on the floor. She states this is new and started after her EMU admission. Her aunt recalls a seizure at the beach last [place], she was sitting on the couch then it looked like she was grabbing something on her left side with left arm extended, turned in a circle, and fell out. She vomited after and had a horrible headache. Last seizure was 2 weeks ago, she was getting something out of the dryer then her left hand stiffened up and she woke up on the floor. Her grandmother reported she was foaming at the mouth, eyes rolled back, seizure lasted 5 minutes. She feels nauseated and weak after, no focal weakness. She typically has headaches after a seizure. Yesterday she had a "sick headache" with nausea, no vomiting and wears sunglasses in the office today.  She has a contentious relationship with her mother. She was previously living in her own apartment, alternating between her apartment and father's house. Her father was recently diagnosed with a medical condition and she moved back in with her mother. She feels her mother wants to control everything. Her mother and aunt expressed concern that about living alone and making decisions to live on her own, stating she has had troubles in her life processing issues, needing reminders  to brush her teeth for instance. Prior records from Dr. Sharene Skeans indicated she had psychoeducational testing that revealed an IQ of 74. Genetic testing was normal. She lost 2 jobs and is on disability.  Epilepsy Risk Factors:  She had a febrile seizure at age 75. Her mother reports there was a "knot in the umbilical cord" and meconium in amniotic fluid, she needed antibiotics for a few days. Otherwise she had a normal early development.  There is no history of CNS infections such as meningitis/encephalitis, significant traumatic brain injury, neurosurgical procedures, or family history of seizures.   Prior ASMs: Levetiracetam, Zonisamide,  Topiramate, clobazam (lethargy and fatigue)  Diagnostic Data: Brain MRI with and without contrast in 2018 was normal.  EEG in 2019 was abnormal due to the presence of occasional generalized irregular high voltage 4-5 Hz spike and polyspike and wave discharges, some with right-sided predominance.  Routine EEG in 06/2021 was normal.  Ambulatory EEG in 06/2021 was normal, one event of seeing flashes of light when closing eyes with no changes in the EEG background. A normal EEG does not exclude a diagnosis of epilepsy.  EMU admission 08/21/21 to 08/25/21 captured 2 events which were consistent with seizures, with generalized onset.  However, focal epilepsy from right frontal region could also have similar appearance.  Unfortunately, we were unable to capture the new events patient has had during which patient does not feel well, comes to her mother and then has full body shaking.  It is possible that patient has coexisting nonepileptic events.      Current Outpatient Medications on File Prior to Visit  Medication Sig Dispense Refill   clonazePAM (KLONOPIN) 0.5 MG tablet Take 1 tablet (0.5 mg total) by mouth 2 (two) times daily. 60 tablet 5   lamoTRIgine (LAMICTAL) 200 MG tablet Take 1 tablet (200 mg total) by mouth 2 (two) times daily. 180 tablet 3   levETIRAcetam (KEPPRA) 1000 MG tablet Take 1 tablet twice a day (Take with 500 mg tablet for a total of 1500 mg twice a day) 180 tablet 3   levETIRAcetam (KEPPRA) 500 MG tablet Take 1 tablet twice a day (Take with 1000mg  tablet for a total of 1500mg  twice a day). 180 tablet 3   Lacosamide 100 MG TABS Take 1 tablet (100 mg total) by mouth in the morning and at bedtime. 60 tablet 5   No current facility-administered medications on file prior to visit.     Observations/Objective:   Vitals:   05/16/22 1441  Weight: 157 lb (71.2 kg)  Height: 5' (1.524 m)   GEN:  The patient appears stated age and is in NAD.  Neurological examination: Patient is  awake, alert. No aphasia or dysarthria. Intact fluency and comprehension.Cranial nerves: Extraocular movements intact. No facial asymmetry. Motor: moves all extremities symmetrically, at least anti-gravity x 4.    Assessment and Plan:   This is a 26 yo RH woman with a history of ADD, OSA, and seizures. She has had seizures since childhood with generalized convulsions and myoclonic jerks. During her EMU admission in February 2023, she had 2 convulsive seizures with focal clinical features, EEG pattern could be seen with generalized epilepsy however focal epilepsy from the right frontal region could also have similar appearance. MRI brain normal. She denies any typical seizures in 2 months. She however has daily symptoms where she feels dizzy and feels like she will have a seizure but it does not progress to a typical seizure. No change with taking Lacosamide (  had side effects) or with increasing clonazepam back to BID dosing. It is unclear if these are seizure-related or due to anxiety, we discussed doing a 48-hour ambulatory EEG to characterize her symptoms to help with long-term management. Continue Levetiracetam 1500mg  BID and Lamotrigine 200mg  BID for now. She has seen Dr. who recommended gabapentin to help with anxiety but has not received the prescription. She has a follow-up with him later this month. Follow-up after aEEG, she knows to call for any changes.    Follow Up Instructions:    -I discussed the assessment and treatment plan with the patient. The patient was provided an opportunity to ask questions and all were answered. The patient agreed with the plan and demonstrated an understanding of the instructions.   The patient was advised to call back or seek an in-person evaluation if the symptoms worsen or if the condition fails to improve as anticipated.     , MD

## 2022-05-30 ENCOUNTER — Other Ambulatory Visit: Payer: Medicare Other | Admitting: Neurology

## 2022-05-30 ENCOUNTER — Telehealth (HOSPITAL_BASED_OUTPATIENT_CLINIC_OR_DEPARTMENT_OTHER): Payer: Medicare Other | Admitting: Psychiatry

## 2022-05-30 ENCOUNTER — Encounter (HOSPITAL_COMMUNITY): Payer: Self-pay | Admitting: Psychiatry

## 2022-05-30 VITALS — Wt 157.0 lb

## 2022-05-30 DIAGNOSIS — F1210 Cannabis abuse, uncomplicated: Secondary | ICD-10-CM

## 2022-05-30 DIAGNOSIS — F411 Generalized anxiety disorder: Secondary | ICD-10-CM

## 2022-05-30 DIAGNOSIS — F0630 Mood disorder due to known physiological condition, unspecified: Secondary | ICD-10-CM

## 2022-05-30 MED ORDER — LURASIDONE HCL 20 MG PO TABS: 20.0000 mg | ORAL_TABLET | Freq: Two times a day (BID) | ORAL | 0 refills | Status: DC

## 2022-05-30 NOTE — Progress Notes (Signed)
Virtual Visit via Video Note  I connected with Maria Day on 05/30/22 at  9:30 AM EST by a video enabled telemedicine application and verified that I am speaking with the correct person using two identifiers.  Location: Patient: Home Provider: Home Office   I discussed the limitations of evaluation and management by telemedicine and the availability of in person appointments. The patient expressed understanding and agreed to proceed.  History of Present Illness: Patient is evaluated by video session.  Her mother was also available.  She is a 26 year old Caucasian, unemployed single female who was seen first time 4 weeks ago.  She did history of mood symptoms, anxiety and seizure disorder.  She was having these episodes very frequently and she is afraid to leave the place.  We have recommended gabapentin however patient did not picked up.  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  Her mother reported patient continued to have mood swings, irritability, impulsive behavior.  She recently had a visit with the neurology.  No new medication added.  Mother is concerned the patient does not leave the house and wondering if she need assisted living facility.  I noticed having an argument with between patient and the mother on this issue.  Patient admitted a lot of anxiety when she tried to cut down the Klonopin and she is back on 0.5 mg twice a day given by neurology.  She also taking Lamictal and Keppra.  Patient admitted to still have impulsivity and some time trust issues.  In the past she has given Risperdal which worked very well but she had lactation.  Patient denies any hallucination, suicidal thoughts or homicidal thoughts.  She was using cannabis and we recommended that she need to stop due to underlying health condition and patient reported since the last visit she has not used marijuana.  She denies any dizziness, fall.  She  reported chronic fatigue which could be due to medication.  She admitted sometimes gets irritated but she denies any recent seizure but she reported symptoms feel the same.  She does not feel safe out of her house.  Currently she is not seeing any therapist.  Patient has a strong family history of mental disorder.  Mother had depression, grandmother committed suicide and brother had drug overdose.  Sister has bipolar and schizophrenia.  Patient's father had bipolar disorder.     Past Psychiatric History: History of learning disorder, ADHD, oppositional defiant disorder.  Had psychological testing in the past with IQ around 75.  She struggle with grades in the school however with the help of vocational rehab she finished high school with good grades.  She saw a psychiatrist and prescribed Concerta for ADD and Risperdal for mood symptoms.  No history of suicidal attempt, inpatient or abuse.  Psychiatric Specialty Exam: Physical Exam  Review of Systems  Weight 157 lb (71.2 kg).There is no height or weight on file to calculate BMI.  General Appearance: Casual  Eye Contact:  Fair  Speech:  Slow  Volume:  Decreased  Mood:  Anxious and Irritable  Affect:  Labile  Thought Process:  Descriptions of Associations: Intact  Orientation:  Full (Time, Place, and Person)  Thought Content:  Paranoid Ideation and Rumination  Suicidal Thoughts:  No  Homicidal Thoughts:  No  Memory:  Immediate;   Good Recent;   Fair Remote;   Fair  Judgement:  Intact  Insight:  Shallow  Psychomotor Activity:  Increased  Concentration:  Concentration: Fair and Attention Span: Fair  Recall:  [employer] of Knowledge:  Fair  Language:  Good  Akathisia:  No  Handed:  Right  AIMS (if indicated):     Assets:  Communication Skills Desire for Improvement Housing Social Support  ADL's:  Intact  Cognition:  WNL  Sleep:        Assessment and Plan: Mood disorder secondary underlying medical illness.  Rule out bipolar  disorder.  Generalized anxiety disorder.  Cannabis use.  Patient has not picked up the gabapentin.  I did talk to patient and her mother about symptoms with labile mood, impulsivity, anxiety and with the strong family history of mental disorder.  Since patient has a good response with Risperdal I recommend should consider the psychotic medication to help her symptoms.  After some discussion she agreed to try Latuda and we will start 20 mg 2 times a day.  Recommend to keep all other medication including Klonopin 0.5 mg twice a day prescribed by neurology.  I do believe patient need a therapist and possible vocational rehab.  Mother is concerned about her future.  I also provide nurse direct line in case she has any question about the medication or issue at the pharmacy.  Discussed medication side effects and benefits.  Patient is no longer using cannabis.  Patient and her mother has multiple questions which were answered.  I will follow up in 2 to 3 weeks.  We will also refer her to see therapist.  Discussed safety concern that anytime having active suicidal thoughts or homicidal thought then she need to call 911 or go to local emergency room.  Follow Up Instructions:    I discussed the assessment and treatment plan with the patient. The patient was provided an opportunity to ask questions and all were answered. The patient agreed with the plan and demonstrated an understanding of the instructions.   The patient was advised to call back or seek an in-person evaluation if the symptoms worsen or if the condition fails to improve as anticipated.  Collaboration of Care: Other provider involved in patient's care AEB notes are available in epic to review.  Patient/Guardian was advised Release of Information must be obtained prior to any record release in order to collaborate their care with an outside provider. Patient/Guardian was advised if they have not already done so to contact the registration department  to sign all necessary forms in order for [place] to release information regarding their care.   Consent: Patient/Guardian gives verbal consent for treatment and assignment of benefits for services provided during this visit. Patient/Guardian expressed understanding and agreed to proceed.    I provided 35 minutes of non-face-to-face time during this encounter.   Kathlee Nations, MD

## 2022-06-01 ENCOUNTER — Telehealth (HOSPITAL_COMMUNITY): Payer: Self-pay | Admitting: *Deleted

## 2022-06-01 NOTE — Telephone Encounter (Signed)
Most psychotropic medication can lower the seizure threshold.  Unfortunately there is no specific test that can determine which medicine can cause seizures.  I would recommend she should try Geodon 20 mg and it is a very low dose.  She can also check with her neurologist.

## 2022-06-01 NOTE — Telephone Encounter (Signed)
Pt's mother, [name], called with concerns about pt starting the Latuda 20 mg as she has been reading about s/e and saw aomething about seizures. As you are aware pt has a seizure d/o. Mother wants to know what you recommend. Next appointment scheduled for 06/13/22. Please review and advise.

## 2022-06-01 NOTE — Telephone Encounter (Signed)
LVM for mother  

## 2022-06-06 ENCOUNTER — Telehealth (HOSPITAL_COMMUNITY): Payer: Self-pay | Admitting: *Deleted

## 2022-06-06 ENCOUNTER — Telehealth: Payer: Self-pay | Admitting: Neurology

## 2022-06-06 NOTE — Telephone Encounter (Signed)
Pt called back she is stated that her psych dr want to start her on Geodon 20 mg asking if that is ok for her to take? She stated that she changed her EEG to Jan 3rd she stated her freezing and starring gets worse and wants you to know that around 6 pm until time for her to take her medication at 8 pm she gets worse. Pt asking if she needs to keep her appointment with [facility]. Pt was advised that Dr [name] is in clinic and that I will call her back when Dr [name] gets back to me.

## 2022-06-06 NOTE — Telephone Encounter (Signed)
Pt left a voicemail stating that she has couple of questions for her nurse Herbert Seta

## 2022-06-06 NOTE — Telephone Encounter (Signed)
Writer spoke with pt's mother, [name], who called regarding starting Geodon 20 mg (see previous message). Mother states that she has been researching the Geodon and that it can also lower seizure threshold. Writer advised mother that all psychotropics have that potential and Dr. Lolly Mustache recommending they check with pt Neurologist first. Mother agreed and will call this nurse back.

## 2022-06-06 NOTE — Telephone Encounter (Signed)
Pls let her know:  Ok to take Geodon, Dr. Lolly Mustache is aware of her seizure history 2. She can take the Lamictal at 6pm and the Keppra at 8pm to spread out her medications and see if this helps with how she is feeling 3. It is up to her which neurologist she goes to, but she needs to only see one and cancel the other.

## 2022-06-06 NOTE — Telephone Encounter (Signed)
Pt called an informed that Dr [name] stated the following: Ok to take Geodon, Dr. Lolly Mustache is aware of her seizure history 2. She can take the Lamictal at 6pm and the Keppra at 8pm to spread out her medications and see if this helps with how she is feeling 3. It is up to her which neurologist she goes to, but she needs to only see one and cancel the other. She is going to DC her appointment with [facility] and stay with our office ,

## 2022-06-07 ENCOUNTER — Ambulatory Visit: Payer: Medicare Other | Admitting: Neurology

## 2022-06-13 ENCOUNTER — Telehealth: Payer: Self-pay

## 2022-06-13 ENCOUNTER — Encounter (HOSPITAL_COMMUNITY): Payer: Self-pay | Admitting: Psychiatry

## 2022-06-13 ENCOUNTER — Telehealth (HOSPITAL_BASED_OUTPATIENT_CLINIC_OR_DEPARTMENT_OTHER): Payer: Medicare Other | Admitting: Psychiatry

## 2022-06-13 VITALS — Wt 170.0 lb

## 2022-06-13 DIAGNOSIS — F902 Attention-deficit hyperactivity disorder, combined type: Secondary | ICD-10-CM

## 2022-06-13 DIAGNOSIS — F0630 Mood disorder due to known physiological condition, unspecified: Secondary | ICD-10-CM

## 2022-06-13 DIAGNOSIS — F819 Developmental disorder of scholastic skills, unspecified: Secondary | ICD-10-CM | POA: Diagnosis not present

## 2022-06-13 MED ORDER — ZIPRASIDONE HCL 20 MG PO CAPS: 20.0000 mg | ORAL_CAPSULE | Freq: Two times a day (BID) | ORAL | 0 refills | Status: DC

## 2022-06-13 NOTE — Telephone Encounter (Signed)
Patient is wanting to know if the medication,Geodon will interfere with any other medications. Asked for a call back before 2 pm as she has a psychiatric appointment at 2. Advised the 24 hour turn around time and patietn verbalized understanding.

## 2022-06-13 NOTE — Telephone Encounter (Signed)
Question was answered Via mychart message today and in previous phone calls.

## 2022-06-13 NOTE — Progress Notes (Signed)
Virtual Visit via Video Note  I connected with Maria Day on 06/13/22 at  2:40 PM EST by a video enabled telemedicine application and verified that I am speaking with the correct person using two identifiers.  Location: Patient: Home Provider: Home Office   I discussed the limitations of evaluation and management by telemedicine and the availability of in person appointments. The patient expressed understanding and agreed to proceed.  History of Present Illness: Patient is evaluated by video session.  Her mother was not available but her aunt who lives in [place] was on the phone with her.  On the last visit we talk about starting Latuda since patient do not want to go back to Risperdal due to lactation history.  After reviewing the side effects she decided not to take the Latuda because it can cause seizures.  We have explained that the majority of the psychotropic medication can cause change in seizure threshold and unless we do not try it would be difficult to know which medicine actually caused seizure.  Patient also contact her neurologist who recommended Geodon should be okay.  Patient is already taking seizure controlling medication and we recommend to try low-dose Geodon and after some discussion patient and her aunt agreed to give a try.  Patient still have a lot of mood lability, irritability, anger, poor impulse control.  She noticed more symptoms around her mother.  Her aunt told that patient cannot live by herself and she needs some supervision.  In the past mother had suggestion for her to move to assisted living but she got very upset.  Now as per aunt plan is under consideration that she can live 2 weeks with the patient's mother in 2 weeks with the patient's father.  Patient's parents are separated.  Her aunt also wants some more resources to help her.  In the past we have discussed patient may need vocational rehab and we have recommended therapist and patient has appointment but she  does not know the name of the therapist.  Patient denies any seizures since September but is still afraid to leave the house.  She still gets dizzy.  Though she denies any further seizures but she feels having symptoms of seizure-like activity as she stares on certain object for 1 to 2-minute.  She denies any suicidal thoughts, paranoia, hallucination.  Currently she is not working as she had lost earlier due to seizure activities.  She reported not smoking marijuana.  She is taking Lamictal, Keppra and Klonopin prescribed by neurologist.  Recently timing is adjusting so she can tolerate better and not to have dizziness.  Patient is struggling with attention, concentration.  She gets easily distracted and forgetful but able to answer.  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 Had psychological testing with IQ around 34.  H/O struggle with grades in school but with help of VR finished high school with good grades. Took Concerta for ADD and Risperdal for mood symptoms.  No history of suicidal attempt, inpatient or abuse.   Psychiatric Specialty Exam: Physical Exam  Review of Systems  Weight 170 lb (77.1 kg).There is no height or weight on file to calculate BMI.  General Appearance: Casual  Eye Contact:  Fair  Speech:  Slow  Volume:  Normal  Mood:  Anxious and Irritable  Affect:  Labile  Thought Process:  Descriptions of Associations: Intact  Orientation:  Full (Time, Place, and Person)  Thought Content:  Paranoid Ideation and Rumination  Suicidal  Thoughts:  No  Homicidal Thoughts:  No  Memory:  Immediate;   Good Recent;   Fair Remote;   Fair  Judgement:  Fair  Insight:  Shallow  Psychomotor Activity:  Increased  Concentration:  Concentration: Fair and Attention Span: Fair  Recall:  [employer] of Knowledge:  Fair  Language:  Fair  Akathisia:  No  Handed:  Right  AIMS (if indicated):     Assets:  Communication Skills Desire for Improvement Social  Support  ADL's:  Intact  Cognition:  WNL  Sleep:   fair      Assessment and Plan: Learning disability.  ADHD, combined type.  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  Patient agreed to try low-dose Geodon and she had consulted with a neurology who also agree.  Patient never picked up the Latuda.  We will start 20 mg 2 times a day and I recommend if she noticed worsening of staring or having any seizures then she must stop the medication immediately.  We also talk about getting neurocognitive testing to help her to get vocational rehab.  Family is talking about patient is staying 2 weeks with the father and 2 weeks with the mother.  Patient has appointment with the neurologist coming up in January and having a new EEG.  I will forward my notes to her.  Discussed safety concern that anytime having active suicidal thoughts or homicidal her then she need to call 911 or go to local emergency room.  I encouraged to keep the appointment with therapist.  Follow-up in 4 weeks.  Follow Up Instructions:    I discussed the assessment and treatment plan with the patient. The patient was provided an opportunity to ask questions and all were answered. The patient agreed with the plan and demonstrated an understanding of the instructions.   The patient was advised to call back or seek an in-person evaluation if the symptoms worsen or if the condition fails to improve as anticipated.  Collaboration of Care: Other provider involved in patient's care AEB notes are available in epic to review.  Patient/Guardian was advised Release of Information must be obtained prior to any record release in order to collaborate their care with an outside provider. Patient/Guardian was advised if they have not already done so to contact the registration department to sign all necessary forms in order for [place] to release information regarding their care.   Consent: Patient/Guardian gives verbal consent for treatment and assignment  of benefits for services provided during this visit. Patient/Guardian expressed understanding and agreed to proceed.    I provided 35 minutes of non-face-to-face time during this encounter.   Cleotis Nipper, MD

## 2022-06-15 ENCOUNTER — Telehealth: Payer: Self-pay | Admitting: Neurology

## 2022-06-15 ENCOUNTER — Telehealth: Payer: Self-pay

## 2022-06-15 NOTE — Telephone Encounter (Signed)
Answered on MyChart

## 2022-06-15 NOTE — Telephone Encounter (Signed)
Called and spoke to patient about her questions being sent Mychart and phone calls. Patients question was answered multiple times on Mychart and patient continuously sent message after message within minutes of one another.   Spoke to Dr. [name] and she stated to inform patient that Mychart is not like text messaging. It is also not a place where an answer is expected in a few hours, there is a 24-48 hour turnover for response. She is to take the clonazepam exactly the same way she is taking it right now. Take it the same way. The Ziprasidone is in addition to what she is taking. And also to let her know how MyChart should be used and may be disabled if she continues this.  Patient was advised again to take her medication exactly as it was prescribed. So keep taking clonazepam one in the morning and one at night. The Ziprasidone is in addition to what she is taking. Patient verbalized understanding of these instructions.  Patient was informed of the above and understands that if she continues this behavior on MyChart we will disable her account. I reminded patient that when messages are constantly coming in it drowns our call boxes and MyChart should not be used that way. I also informed her that we have 24-48 hours to respond, so if your message is not answered on MyChart you don't need to call because someone will get back to you when they are able to. I let patient know that we are busy with other patients here in office and we are not ignoring her. Patient verbalized understanding of what was explained to her and apologized.

## 2022-06-15 NOTE — Telephone Encounter (Signed)
Pt called in stating she is supposed to start the generic Geodon on Monday. She need to know if she can take it with the Klonopin or if she should stop taking the Klonopin.  She also wants to make sure she can take all of there other medication the same as always with the Geodon.

## 2022-06-18 ENCOUNTER — Emergency Department (HOSPITAL_BASED_OUTPATIENT_CLINIC_OR_DEPARTMENT_OTHER): Payer: Medicare Other

## 2022-06-18 ENCOUNTER — Emergency Department (HOSPITAL_BASED_OUTPATIENT_CLINIC_OR_DEPARTMENT_OTHER)
Admission: EM | Admit: 2022-06-18 | Discharge: 2022-06-18 | Disposition: A | Discharge status: Home / Self Care | Payer: Medicare Other | Attending: Emergency Medicine | Admitting: Emergency Medicine

## 2022-06-18 ENCOUNTER — Encounter (HOSPITAL_BASED_OUTPATIENT_CLINIC_OR_DEPARTMENT_OTHER): Payer: Self-pay | Admitting: Emergency Medicine

## 2022-06-18 ENCOUNTER — Other Ambulatory Visit: Payer: Self-pay

## 2022-06-18 DIAGNOSIS — Z87891 Personal history of nicotine dependence: Secondary | ICD-10-CM | POA: Diagnosis not present

## 2022-06-18 DIAGNOSIS — K5900 Constipation, unspecified: Secondary | ICD-10-CM | POA: Diagnosis not present

## 2022-06-18 DIAGNOSIS — N201 Calculus of ureter: Secondary | ICD-10-CM | POA: Insufficient documentation

## 2022-06-18 DIAGNOSIS — N9489 Other specified conditions associated with female genital organs and menstrual cycle: Secondary | ICD-10-CM | POA: Diagnosis not present

## 2022-06-18 DIAGNOSIS — N23 Unspecified renal colic: Secondary | ICD-10-CM | POA: Diagnosis not present

## 2022-06-18 DIAGNOSIS — K449 Diaphragmatic hernia without obstruction or gangrene: Secondary | ICD-10-CM | POA: Diagnosis not present

## 2022-06-18 DIAGNOSIS — R1032 Left lower quadrant pain: Secondary | ICD-10-CM | POA: Diagnosis present

## 2022-06-18 DIAGNOSIS — N200 Calculus of kidney: Secondary | ICD-10-CM | POA: Diagnosis not present

## 2022-06-18 HISTORY — DX: Calculus of ureter: N20.1

## 2022-06-18 LAB — HCG, SERUM, QUALITATIVE: Preg, Serum: NEGATIVE

## 2022-06-18 LAB — URINALYSIS, MICROSCOPIC (REFLEX)

## 2022-06-18 LAB — URINALYSIS, ROUTINE W REFLEX MICROSCOPIC
Bilirubin Urine: NEGATIVE
Glucose, UA: NEGATIVE mg/dL
Hgb urine dipstick: NEGATIVE
Ketones, ur: NEGATIVE mg/dL
Nitrite: NEGATIVE
Protein, ur: 30 mg/dL — AB
Specific Gravity, Urine: >=1.03 (ref 1.005–1.030)
pH: 5.5 (ref 5.0–8.0)

## 2022-06-18 MED ORDER — KETOROLAC TROMETHAMINE 15 MG/ML IJ SOLN
15.0000 mg | Freq: Once | INTRAMUSCULAR | Status: AC
  Administered 2022-06-18: 15 mg via INTRAVENOUS
  Filled 2022-06-18: qty 1

## 2022-06-18 MED ORDER — OXYCODONE-ACETAMINOPHEN 5-325 MG PO TABS
2.0000 | ORAL_TABLET | Freq: Once | ORAL | Status: AC
  Administered 2022-06-18: 2 via ORAL
  Filled 2022-06-18: qty 2

## 2022-06-18 MED ORDER — ONDANSETRON HCL 4 MG/2ML IJ SOLN
4.0000 mg | Freq: Once | INTRAMUSCULAR | Status: AC
  Administered 2022-06-18: 4 mg via INTRAVENOUS
  Filled 2022-06-18: qty 2

## 2022-06-18 MED ORDER — HYDROMORPHONE HCL 1 MG/ML IJ SOLN
1.0000 mg | Freq: Once | INTRAMUSCULAR | Status: AC
  Administered 2022-06-18: 1 mg via INTRAVENOUS
  Filled 2022-06-18: qty 1

## 2022-06-18 MED ORDER — ONDANSETRON 8 MG PO TBDP: 8.0000 mg | ORAL_TABLET | Freq: Three times a day (TID) | ORAL | 1 refills | Status: DC | PRN

## 2022-06-18 MED ORDER — SODIUM CHLORIDE 0.9 % IV BOLUS
1000.0000 mL | Freq: Once | INTRAVENOUS | Status: AC
  Administered 2022-06-18: 1000 mL via INTRAVENOUS

## 2022-06-18 MED ORDER — OXYCODONE-ACETAMINOPHEN 10-325 MG PO TABS: 1.0000 | ORAL_TABLET | ORAL | 0 refills | Status: DC | PRN

## 2022-06-18 NOTE — ED Provider Notes (Signed)
[facility] Provider Note: [name], MD, FACEP  CSN: [id number] MRN: [id number] ARRIVAL: 06/18/22 at 0431 ROOM: [facility]10/[facility]10   CHIEF COMPLAINT  Flank Pain   HISTORY OF PRESENT ILLNESS  06/18/22 4:44 AM Maria Day is a 26 y.o. female with a history of kidney stone.  She is here with left-sided abdominal pain that radiates down to her groin.  This started about 3 hours ago.  She has had associated nausea and vomiting with this.  She rates her pain as a 10 out of 10.  She has been voiding small amounts of urine.   Past Medical History:  Diagnosis Date   ADHD (attention deficit hyperactivity disorder)    Cognitive impairment    Myoclonus    Reactive depression    Seizures (HCC)    Ureterolithiasis     History reviewed. No pertinent surgical history.  Family History  Problem Relation Age of Onset   Depression Mother    Cancer Father        Leukemia   Schizophrenia Sister    Drug abuse Brother    Suicidality Maternal Grandmother    Hyperlipidemia Maternal Grandfather    Diabetes Paternal Grandfather     Social History   Tobacco Use   Smoking status: Former    Types: Cigarettes   Smokeless tobacco: Never  Vaping Use   Vaping Use: Never used  Substance Use Topics   Alcohol use: No   Drug use: Yes    Types: Marijuana    Prior to Admission medications   Medication Sig Start Date End Date Taking? Authorizing Provider  ondansetron (ZOFRAN-ODT) 8 MG disintegrating tablet Take 1 tablet (8 mg total) by mouth every 8 (eight) hours as needed for nausea or vomiting. 06/18/22  Yes Makale Pindell, MD  oxyCODONE-acetaminophen (PERCOCET) 10-325 MG tablet Take 1 tablet by mouth every 4 (four) hours as needed for pain (may cause constipation). 06/18/22  Yes Bani Gianfrancesco, MD  clonazePAM (KLONOPIN) 0.5 MG tablet Take 1 tablet (0.5 mg total) by mouth 2 (two) times daily. 04/04/22 05/16/22  Van Clines, MD  lamoTRIgine (LAMICTAL) 200 MG tablet Take 1 tablet (200  mg total) by mouth 2 (two) times daily. 04/04/22   Van Clines, MD  levETIRAcetam (KEPPRA) 1000 MG tablet Take 1 tablet twice a day (Take with 500 mg tablet for a total of 1500 mg twice a day) 04/04/22   Van Clines, MD  levETIRAcetam (KEPPRA) 500 MG tablet Take 1 tablet twice a day (Take with 1000mg  tablet for a total of 1500mg  twice a day). 04/04/22   , MD  ziprasidone (GEODON) 20 MG capsule Take 1 capsule (20 mg total) by mouth 2 (two) times daily with a meal. 06/13/22   Arfeen, Van Clines, MD    Allergies Penicillins, Cinnamon, and Amoxicillin   REVIEW OF SYSTEMS  Negative except as noted here or in the History of Present Illness.   PHYSICAL EXAMINATION  Initial Vital Signs Blood pressure (!) 134/94, pulse 88, temperature (!) 97.5 F (36.4 C), resp. rate 18, SpO2 99 %.  Examination General: Well-developed, well-nourished female in no acute distress; appearance consistent with age of record HENT: normocephalic; atraumatic Eyes: Normal appearance Neck: supple Heart: regular rate and rhythm Lungs: clear to auscultation bilaterally Abdomen: soft; nondistended; nontender; bowel sounds present Extremities: No deformity; full range of motion; pulses normal Neurologic: Awake, alert; motor function intact in all extremities and symmetric; no facial droop Skin: Warm and dry Psychiatric: Normal  mood and affect   RESULTS  Summary of this visit's results, reviewed and interpreted by myself:   EKG Interpretation  Date/Time:    Ventricular Rate:    PR Interval:    QRS Duration:   QT Interval:    QTC Calculation:   R Axis:     Text Interpretation:         Laboratory Studies: Results for orders placed or performed during the hospital encounter of 06/18/22 (from the past 24 hour(s))  hCG, serum, qualitative     Status: None   Collection Time: 06/18/22  4:59 AM  Result Value Ref Range   Preg, Serum NEGATIVE NEGATIVE  Urinalysis, Routine w reflex microscopic  Urine, Clean Catch     Status: Abnormal   Collection Time: 06/18/22  6:00 AM  Result Value Ref Range   Color, Urine YELLOW YELLOW   APPearance HAZY (A) CLEAR   Specific Gravity, Urine >=1.030 1.005 - 1.030   pH 5.5 5.0 - 8.0   Glucose, UA NEGATIVE NEGATIVE mg/dL   Hgb urine dipstick NEGATIVE NEGATIVE   Bilirubin Urine NEGATIVE NEGATIVE   Ketones, ur NEGATIVE NEGATIVE mg/dL   Protein, ur 30 (A) NEGATIVE mg/dL   Nitrite NEGATIVE NEGATIVE   Leukocytes,Ua TRACE (A) NEGATIVE  Urinalysis, Microscopic (reflex)     Status: Abnormal   Collection Time: 06/18/22  6:00 AM  Result Value Ref Range   RBC / HPF 0-5 0 - 5 RBC/hpf   WBC, UA 0-5 0 - 5 WBC/hpf   Bacteria, UA FEW (A) NONE SEEN   Squamous Epithelial / LPF 0-5 0 - 5   Hyaline Casts, UA PRESENT    Urine-Other LESS THAN 10 mL OF URINE SUBMITTED    Imaging Studies: CT Renal Stone Study  Result Date: 06/18/2022 CLINICAL DATA:  Left flank pain.  Kidney stone suspected. EXAM: CT ABDOMEN AND PELVIS WITHOUT CONTRAST TECHNIQUE: Multidetector CT imaging of the abdomen and pelvis was performed following the standard protocol without IV contrast. RADIATION DOSE REDUCTION: This exam was performed according to the departmental dose-optimization program which includes automated exposure control, adjustment of the mA and/or kV according to patient size and/or use of iterative reconstruction technique. COMPARISON:  CT without contrast 08/15/2016 FINDINGS: Lower chest: There is posterior atelectasis in the lower lobes, mild chronic elevation right hemidiaphragm. No acute lung base process. Small hiatal hernia.  The cardiac size is normal. Hepatobiliary: No focal abnormality of the liver is visible without contrast. The gallbladder and bile ducts are unremarkable. Pancreas: No focal abnormality is seen without contrast. Spleen: No focal abnormality is seen without contrast. No splenomegaly. Adrenals/Urinary Tract: There is no adrenal mass. The unenhanced renal  cortex without focal abnormality. There is left renal swelling and parenchymal edema, perirenal edema, and mild left perinephric fluid. There is mild left hydroureteronephrosis due to a 2 mm distal ureteral stone 1 cm proximal to the UVJ. Both ureters are otherwise clear. There are few bilateral scattered punctate nonobstructing stones in both kidneys. The bladder is contracted and not well seen. There are no adjacent inflammatory changes or intravesical stones. Stomach/Bowel: Stomach is within normal limits. Appendix appears normal. No evidence of bowel wall thickening, distention, or inflammatory changes. There is moderate stool retention ascending and transverse colon. Vascular/Lymphatic: No significant vascular findings are present. No enlarged abdominal or pelvic lymph nodes. Reproductive: Uterus and bilateral adnexa are unremarkable. Other: There is no incarcerated hernia. Apart from left perinephric fluid no other free fluid is seen. There is no free air,  free hemorrhage or abscess. Musculoskeletal: No acute or significant osseous findings. IMPRESSION: 1. 2 mm distal left ureteral stone 1 cm proximal to the UVJ with mild obstructive uropathy. Correlate clinically for infectious complication. 2. Additional punctate nonobstructing stones in both kidneys. 3. Left perinephric fluid which could be due to forniceal rupture. 4. Constipation. 5. Small hiatal hernia. Electronically Signed   By: Almira Bar M.D.   On: 06/18/2022 06:42    ED COURSE and MDM  Nursing notes, initial and subsequent vitals signs, including pulse oximetry, reviewed and interpreted by myself.  Vitals:   06/18/22 0440 06/18/22 0500 06/18/22 0530 06/18/22 0630  BP: (!) 134/94 (!) 135/101 (!) 137/95 (!) 123/91  Pulse: 88 87 83 89  Resp: 18 18 18 18   Temp: (!) 97.5 F (36.4 C)     SpO2: 99% 98% 91% 99%   Medications  oxyCODONE-acetaminophen (PERCOCET/ROXICET) 5-325 MG per tablet 2 tablet (has no administration in time range)   sodium chloride 0.9 % bolus 1,000 mL (0 mLs Intravenous Stopped 06/18/22 0619)  ondansetron (ZOFRAN) injection 4 mg (4 mg Intravenous Given 06/18/22 0457)  HYDROmorphone (DILAUDID) injection 1 mg (1 mg Intravenous Given 06/18/22 0455)  ketorolac (TORADOL) 15 MG/ML injection 15 mg (15 mg Intravenous Given 06/18/22 0602)   6:33 AM Pain significantly improved after IV medications.  About a 3 mm ureteral stone is seen in the distal left uterus to the UVJ.  This is consistent with the clinical suspicion of ureterolithiasis.  Her urine does not contain any hematuria but not all ureteral stones cause hematuria.   PROCEDURES  Procedures   ED DIAGNOSES     ICD-10-CM   1. Ureterolithiasis  N20.1          Sacheen Arrasmith, MD 06/18/22 [phone]

## 2022-06-18 NOTE — ED Triage Notes (Signed)
Pt c/o left sided abdominal pain that radiates down to her groin with N/V. Pt has hx of kidney stones.

## 2022-06-27 ENCOUNTER — Telehealth (HOSPITAL_COMMUNITY): Payer: Self-pay | Admitting: *Deleted

## 2022-06-27 NOTE — Telephone Encounter (Signed)
Pt returned writers call regarding Geodon dosage. Pt says that she is taking the 20 mg bid but feels that it is "wearing off" by the middle of the day and she feels "ansy" until she takes the next dose in the evening. Pt asked aout taking tid and was advised to wait and let Arfeen review before changing anything. Pt agrees. Pt feels she is able to wait until provider returns from vacation rather than send to covering provider. Pt denies si, irritability, or drastic mood shifts. Pt strongly encouraged to call office back before Dr. Lolly Mustache returns if she feels the need forsooner evaluation. Pt agrees.

## 2022-06-27 NOTE — Telephone Encounter (Signed)
Pt left VM for this nurse asking to increase Geodon dosage or "take more often". Pt is currently prescribed Geodon 20 mg bid. Writer returned pt call but had to LVM asking for confirmation that pt is taking bid as ordered and that she is asking for a higher dose due to what symptoms. Pt given writers direct number and encouraged to call back.

## 2022-07-03 NOTE — Telephone Encounter (Signed)
She can try Geodon 20  mg TID. She need to keep appointment with neurology and psychiatrist. If any issues than call us back.

## 2022-07-06 ENCOUNTER — Telehealth: Payer: Self-pay | Admitting: Neurology

## 2022-07-06 NOTE — Telephone Encounter (Signed)
Pt called stating she would like to get an answer back today, she is not feeling well and would like to get this blood work done.

## 2022-07-06 NOTE — Telephone Encounter (Signed)
Ok for Keppra, lamictal levels. First thing in the morning before taking first dose. Thanks

## 2022-07-06 NOTE — Telephone Encounter (Signed)
Pt's mother called in and left a message with the access nurse. Caller states her daughter has been feeling very different lately. She says she is feeling out of it, feeling lethargic, out of her body. Caller remembers that one time in the past, their past physician did mention she had too much medication in her system. She is requesting blood work. She also recently started to see a phychiatrist/psychology for depression/anxiety and started a new medication for the last few weeks. She is currently taking keppra 1500 bid, lamotrigine 200mg  bid, clonazepam .5mg  bid, and the new one is ziprasidone. Between 530p and when she takes meds at 800p she feels wors, she thinks it's more the seizure medication as it has been going on for a long time. Requesting to do blood work at [facility] on [address].  Full access nurse report is in Dr. Amparo Bristol box

## 2022-07-07 ENCOUNTER — Encounter: Payer: Self-pay | Admitting: Neurology

## 2022-07-09 ENCOUNTER — Other Ambulatory Visit (HOSPITAL_COMMUNITY): Payer: Self-pay | Admitting: Psychiatry

## 2022-07-09 ENCOUNTER — Telehealth (HOSPITAL_COMMUNITY): Payer: Self-pay

## 2022-07-09 ENCOUNTER — Other Ambulatory Visit: Payer: Self-pay

## 2022-07-09 DIAGNOSIS — G40309 Generalized idiopathic epilepsy and epileptic syndromes, not intractable, without status epilepticus: Secondary | ICD-10-CM

## 2022-07-09 DIAGNOSIS — F0630 Mood disorder due to known physiological condition, unspecified: Secondary | ICD-10-CM

## 2022-07-09 DIAGNOSIS — F331 Major depressive disorder, recurrent, moderate: Secondary | ICD-10-CM | POA: Diagnosis not present

## 2022-07-09 DIAGNOSIS — F902 Attention-deficit hyperactivity disorder, combined type: Secondary | ICD-10-CM

## 2022-07-09 DIAGNOSIS — F819 Developmental disorder of scholastic skills, unspecified: Secondary | ICD-10-CM

## 2022-07-09 DIAGNOSIS — F411 Generalized anxiety disorder: Secondary | ICD-10-CM | POA: Diagnosis not present

## 2022-07-09 NOTE — Telephone Encounter (Signed)
She had requested earlier that she like to increase the Geodon because she feels it is wearing off.  This is the reason we increased the Geodon 3 times daily.  If she feels that she cannot handle 3 times a day then she would go back to 2 times a day.

## 2022-07-09 NOTE — Telephone Encounter (Signed)
My chart message sent to pt.

## 2022-07-09 NOTE — Telephone Encounter (Signed)
Patient called to report that since starting the Ziprasidone she is feeling lethargic she is crying more does not like being alone or sleeping alone she stated that this has all gotten worse since starting the medication please advise.

## 2022-07-10 ENCOUNTER — Other Ambulatory Visit (INDEPENDENT_AMBULATORY_CARE_PROVIDER_SITE_OTHER): Payer: Medicare Other

## 2022-07-10 DIAGNOSIS — G40309 Generalized idiopathic epilepsy and epileptic syndromes, not intractable, without status epilepticus: Secondary | ICD-10-CM

## 2022-07-10 NOTE — Telephone Encounter (Signed)
Spoke to patient made her aware of message she voiced understanding

## 2022-07-11 ENCOUNTER — Telehealth (HOSPITAL_COMMUNITY): Payer: Self-pay | Admitting: *Deleted

## 2022-07-11 NOTE — Telephone Encounter (Signed)
Pt called with concerns r/t the Geodon 20 mg (taking BID). Pt says that she has been Googling the medication and she has all the s/e. Pt c/o decreased sleep, possibly r/t restlessness, but not sure when asked. Pt also c/o headaches, dizziness, blurred vision, diarrhea, increased depression, and some restlessness. Pt also mentioned CP which she says is new. Pt encouraged to call 911 or go to nearest ED if CP persist. Pt also concerned that the Geodon will interact with her seizure regime. Next appointment 09/12/22. Please review and advise.

## 2022-07-11 NOTE — Telephone Encounter (Signed)
Her neurologist was ok with Geodon however if having symptoms than she need to stop.

## 2022-07-14 LAB — LEVETIRACETAM LEVEL: Keppra (Levetiracetam): 23.4 ug/mL

## 2022-07-14 LAB — LAMOTRIGINE LEVEL: Lamotrigine Lvl: 5.3 ug/mL (ref 2.5–15.0)

## 2022-07-16 ENCOUNTER — Encounter: Payer: Self-pay | Admitting: Neurology

## 2022-07-16 ENCOUNTER — Telehealth (INDEPENDENT_AMBULATORY_CARE_PROVIDER_SITE_OTHER): Payer: Medicare Other | Admitting: Neurology

## 2022-07-16 VITALS — Ht 59.0 in | Wt 175.0 lb

## 2022-07-16 DIAGNOSIS — G40309 Generalized idiopathic epilepsy and epileptic syndromes, not intractable, without status epilepticus: Secondary | ICD-10-CM | POA: Diagnosis not present

## 2022-07-16 NOTE — Progress Notes (Unsigned)
Virtual Visit via Video Note The purpose of this virtual visit is to provide medical care while limiting exposure to the novel coronavirus.    Consent was obtained for video visit:  Yes.   Answered questions that patient had about telehealth interaction:  Yes.   I discussed the limitations, risks, security and privacy concerns of performing an evaluation and management service by telemedicine. I also discussed with the patient that there may be a patient responsible charge related to this service. The patient expressed understanding and agreed to proceed.  Pt location: Home Physician Location: office Name of referring provider:  No ref. provider found I connected with Maria Day at patients initiation/request on 07/16/2022 at  3:30 PM EST by video enabled telemedicine application and verified that I am speaking with the correct person using two identifiers. Pt MRN:  [id number] Pt DOB:  1996-05-11 Video Participants:  Maria Day;  Oretha Ellis (mother)   History of Present Illness: *** Around 8 or 9, feeling a certain way,  Worse since in the hospital A lot of change in myself, not able to sleep by myself or be by myself; bad anxiety Does not feel like I'm in my body Lethargic, out of it; out of sorts; can't be on phone for long periods of time, gets dizzy and has to stop bec my brain is telling me you dont need to be watching anymore; blurry, like I'm just not in my body Onetime at dad's house, went to grocery with stepmom, now does nto want to go to any stores/bright lights, sits around house in the dark Lights mess with me; kind of had a panic attack Also seeing a therapist, just saw her once; wants to meet once a week A lot more dizzy than she usually is Night times are very difficult, more sensitive to lights; feels dizzy During day its more anxiety I stare, I can find myself just staring; daily They can tell a hug huge diff, not nearly as emotional, argumentative; but she  thinks its worse; getting orgaznied, writing notes, we feel like its diff as night and day; but she is not seeing the benefits like we are; I notice that I do cry, not nearly as much; I looked at the side effects Tearful; anxious Maternal uncle told them about  2/5 EEG   The patient had a virtual video visit on 05/16/2022. She was last seen in the office 6 weeks ago for seizures. She presents for an earlier visit to discuss brain MRI results and continued symptoms. I personally reviewed brain MRI with and without contrast done 05/14/2022 which was normal, hippocampi symmetric with no abnormal signal or enhancement seen. She reports side effects on the Lacosamide, she took it for a few days and kept feeling like her "brain was coming out of my forehead." It was hard to close her eyes. She stopped the Lacosamide and symptoms resolved. She continues on Levetiracetam 1500mg  BID (1000+500mg  BID) and Lamotrigine 200mg  BID. She has daily episodes especially at night where she feels very dizzy like she will have a seizure. She had previously cut down on clonazepam to every other day, however in the past week went back to taking it BID with no improvement in these symptoms. Her last typical seizure was 2 months ago. She just does not feel good at all. She has not been going into stores, very terrified to go into places. She has seen Dr. Adele Schilder and Gabapentin was recommended however she has not received the  prescription. She has not heard back from the psychotherapist to schedule an appointment, she will be seeing Dr. Lolly Mustache again this month.    History on Initial Assessment 04/04/2022: This is a 27 year old right-handed woman with a history of ADD, OSA, and seizures, presenting to establish care. I had previously seen her in 2018 however she then went to [facility] in 2019-2022 for further epilepsy management. She was started on Lamotrigine in 2019. She was seizure-free from 2018 to 08/2020 when she called  about a blackout episode with no body jerking, then called to report more seizures with grand mal and staring episodes, Lamotrigine increased to 100-200mg  in 11/2020. Levetiracetam increased to 1000mg  BID in 12/2020. She then saw epileptologist Dr. 01/2021 in 04/2021. They were reporting mild body tremors, inability to respond to verbal stimuli. Routine and Ambulatory EEG were normal in 06/2021. She was admitted to the EMU from February 20-24, 2023 where baseline EEG showed generalized spikes and polyspikes with right frontal predominance, predominantly seen during sleep. Seizure medications were discontinued and 2 events were captured. The first seizure was on 2/21, she was initially off camera for the first minute, then she was seen spinning in counterclockwise direction, left arm elevated with subtle jerking, then fell. Left arm and leg were extended, right arm flexed and adductor, leg flexed and abducted followed by figure-of-four posturing with left arm extension and right arm flexion. Onset showed generalized and maximal right frontal spike followed by 6-7 Hz theta activity which appeared generalized and maximal in right hemisphere. The second seizure was on 2/23, after about 15 seconds of EEG seizure, she had left upper extremity flexed, elevated, and abducted with patient turning to the left and forced left gaze deviation. She was holding something in the right arm which was flexed and adducted, then she had figure-of-four posturing with left upper extremity extension and right upper extremity flexion, followed by convulsion. EEG onset showed similar changes to prior seizure. 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 She was discharged home on Levetiracetam 1500mg  BID, Lamotrigine 200mg  BID, and Clobazam 10mg  daily was added to her regimen. On her follow-up with Dr. 3/23 in 11/2021, she continued to report out of  body phenomenon and feeling like she would have a seizure, as well as lethargy/fatigue on Clobazam. Symptoms were felt due to anxiety as she is under a lot of stress, and clonazepam 0.5mg  BID was started. She did well on the clonazepam but when she ran out, she was started on Hydroxyzine, which made her feel weird. She was prescribed Lacosamide but did not start it. She felt being off clonazepam led to increase in seizure-like activity. She was advised to follow-up with Psychiatry but has not been able to. She saw her PCP in [name] who "agreed with me that Fluoxetine is not working," she patient self-discontinued it a month ago. She states the clonazepam really helps calm her anxiety, when she dose not take it, anxiety is almost uncontrollable. She states that she has "very, very, very bad anxiety," and has not felt like herself in a very long time. She feels like she is an alien in her body. She is scared to go out of the house, which is new since February.   Her mother reports that the episodes where she would get nauseated, crunch down and shake (not violently) had stopped since February, however her convulsive seizures have recurred where  she is foaming at the mouth and "breathing bad." She states her left hand would stiffen up, then she wakes up on the floor. She states this is new and started after her EMU admission. Her aunt recalls a seizure at the beach last May, she was sitting on the couch then it looked like she was grabbing something on her left side with left arm extended, turned in a circle, and fell out. She vomited after and had a horrible headache. Last seizure was 2 weeks ago, she was getting something out of the dryer then her left hand stiffened up and she woke up on the floor. Her grandmother reported she was foaming at the mouth, eyes rolled back, seizure lasted 5 minutes. She feels nauseated and weak after, no focal weakness. She typically has headaches after a seizure. Yesterday she had  a "sick headache" with nausea, no vomiting and wears sunglasses in the office today.  She has a contentious relationship with her mother. She was previously living in her own apartment, alternating between her apartment and father's house. Her father was recently diagnosed with a medical condition and she moved back in with her mother. She feels her mother wants to control everything. Her mother and aunt expressed concern that about living alone and making decisions to live on her own, stating she has had troubles in her life processing issues, needing reminders to brush her teeth for instance. Prior records from Dr. Gaynell Face indicated she had psychoeducational testing that revealed an IQ of 69. Genetic testing was normal. She lost 2 jobs and is on disability.  Epilepsy Risk Factors:  She had a febrile seizure at age 80. Her mother reports there was a "knot in the umbilical cord" and meconium in amniotic fluid, she needed antibiotics for a few days. Otherwise she had a normal early development.  There is no history of CNS infections such as meningitis/encephalitis, significant traumatic brain injury, neurosurgical procedures, or family history of seizures.   Prior ASMs: Levetiracetam, Zonisamide, Topiramate, clobazam (lethargy and fatigue)  Diagnostic Data: Brain MRI with and without contrast in 2018 was normal.  EEG in 2019 was abnormal due to the presence of occasional generalized irregular high voltage 4-5 Hz spike and polyspike and wave discharges, some with right-sided predominance.  Routine EEG in 06/2021 was normal.  Ambulatory EEG in 06/2021 was normal, one event of seeing flashes of light when closing eyes with no changes in the EEG background. A normal EEG does not exclude a diagnosis of epilepsy.  EMU admission 08/21/21 to 08/25/21 captured 2 events which were consistent with seizures, with generalized onset.  However, focal epilepsy from right frontal region could also have similar  appearance.  Unfortunately, we were unable to capture the new events patient has had during which patient does not feel well, comes to her mother and then has full body shaking.  It is possible that patient has coexisting nonepileptic events.      Current Outpatient Medications on File Prior to Visit  Medication Sig Dispense Refill   clonazePAM (KLONOPIN) 0.5 MG tablet Take 1 tablet (0.5 mg total) by mouth 2 (two) times daily. 60 tablet 5   lamoTRIgine (LAMICTAL) 200 MG tablet Take 1 tablet (200 mg total) by mouth 2 (two) times daily. 180 tablet 3   levETIRAcetam (KEPPRA) 1000 MG tablet Take 1 tablet twice a day (Take with 500 mg tablet for a total of 1500 mg twice a day) 180 tablet 3   levETIRAcetam (KEPPRA) 500 MG tablet  Take 1 tablet twice a day (Take with 1000mg  tablet for a total of 1500mg  twice a day). 180 tablet 3   ziprasidone (GEODON) 20 MG capsule Take 1 capsule (20 mg total) by mouth 2 (two) times daily with a meal. 60 capsule 0   No current facility-administered medications on file prior to visit.     Observations/Objective:   Vitals:   07/16/22 1423  Weight: 175 lb (79.4 kg)  Height: 4\' 11"  (1.499 m)   GEN:  The patient appears stated age and is in NAD.  Neurological examination: Patient is awake, alert, oriented x 3. No aphasia or dysarthria. Intact fluency and comprehension. Remote and recent memory intact. Able to name and repeat. Cranial nerves: Extraocular movements intact with no nystagmus. No facial asymmetry. Motor: moves all extremities symmetrically, at least anti-gravity x 4. No incoordination on finger to nose testing. Gait: narrow-based and steady, able to tandem walk adequately. Negative Romberg test.    Assessment and Plan:   This is a 27 yo RH woman with a history of ADD, OSA, and seizures. She has had seizures since childhood with generalized convulsions and myoclonic jerks. During her EMU admission in February 2023, she had 2 convulsive seizures with focal  clinical features, EEG pattern could be seen with generalized epilepsy however focal epilepsy from the right frontal region could also have similar appearance. MRI brain normal. She denies any typical seizures in 2 months. She however has daily symptoms where she feels dizzy and feels like she will have a seizure but it does not progress to a typical seizure. No change with taking Lacosamide (had side effects) or with increasing clonazepam back to BID dosing. It is unclear if these are seizure-related or due to anxiety, we discussed doing a 48-hour ambulatory EEG to characterize her symptoms to help with long-term management. Continue Levetiracetam 1500mg  BID and Lamotrigine 200mg  BID for now. She has seen Dr. who recommended gabapentin to help with anxiety but has not received the prescription. She has a follow-up with him later this month. Follow-up after aEEG, she knows to call for any changes.    Follow Up Instructions:    -I discussed the assessment and treatment plan with the patient. The patient was provided an opportunity to ask questions and all were answered. The patient agreed with the plan and demonstrated an understanding of the instructions.   The patient was advised to call back or seek an in-person evaluation if the symptoms worsen or if the condition fails to improve as anticipated.    Total time spent on today's visit was ***minutes, including both face-to-face time and nonface-to-face time.  Time included that spent on review of records (prior notes available to me/labs/imaging if pertinent), discussing treatment and goals, answering patient's questions and coordinating care.   25, MD

## 2022-07-17 NOTE — Patient Instructions (Signed)
Good to see you.  Proceed with home EEG as scheduled  2. Continue all your medications  3. Continue follow-up with Behavioral Health  4. Follow-up after EEG, call for any changes.

## 2022-07-19 ENCOUNTER — Other Ambulatory Visit (HOSPITAL_COMMUNITY): Payer: Self-pay | Admitting: Psychiatry

## 2022-07-19 DIAGNOSIS — F902 Attention-deficit hyperactivity disorder, combined type: Secondary | ICD-10-CM

## 2022-07-19 DIAGNOSIS — F0630 Mood disorder due to known physiological condition, unspecified: Secondary | ICD-10-CM

## 2022-07-19 DIAGNOSIS — F819 Developmental disorder of scholastic skills, unspecified: Secondary | ICD-10-CM

## 2022-07-20 ENCOUNTER — Other Ambulatory Visit (HOSPITAL_COMMUNITY): Payer: Self-pay | Admitting: *Deleted

## 2022-07-20 DIAGNOSIS — F902 Attention-deficit hyperactivity disorder, combined type: Secondary | ICD-10-CM

## 2022-07-20 DIAGNOSIS — F0630 Mood disorder due to known physiological condition, unspecified: Secondary | ICD-10-CM

## 2022-07-20 DIAGNOSIS — F411 Generalized anxiety disorder: Secondary | ICD-10-CM | POA: Diagnosis not present

## 2022-07-20 DIAGNOSIS — F331 Major depressive disorder, recurrent, moderate: Secondary | ICD-10-CM | POA: Diagnosis not present

## 2022-07-20 DIAGNOSIS — F819 Developmental disorder of scholastic skills, unspecified: Secondary | ICD-10-CM

## 2022-07-20 DIAGNOSIS — Z638 Other specified problems related to primary support group: Secondary | ICD-10-CM | POA: Diagnosis not present

## 2022-07-20 MED ORDER — ZIPRASIDONE HCL 20 MG PO CAPS: 20.0000 mg | ORAL_CAPSULE | Freq: Two times a day (BID) | ORAL | 0 refills | Status: DC

## 2022-08-14 ENCOUNTER — Other Ambulatory Visit (HOSPITAL_COMMUNITY): Payer: Self-pay | Admitting: Psychiatry

## 2022-08-14 DIAGNOSIS — F902 Attention-deficit hyperactivity disorder, combined type: Secondary | ICD-10-CM

## 2022-08-14 DIAGNOSIS — F819 Developmental disorder of scholastic skills, unspecified: Secondary | ICD-10-CM

## 2022-08-14 DIAGNOSIS — F0630 Mood disorder due to known physiological condition, unspecified: Secondary | ICD-10-CM

## 2022-08-16 ENCOUNTER — Other Ambulatory Visit (HOSPITAL_COMMUNITY): Payer: Self-pay | Admitting: *Deleted

## 2022-08-16 ENCOUNTER — Telehealth (HOSPITAL_COMMUNITY): Payer: Self-pay | Admitting: *Deleted

## 2022-08-16 DIAGNOSIS — F902 Attention-deficit hyperactivity disorder, combined type: Secondary | ICD-10-CM

## 2022-08-16 DIAGNOSIS — F0630 Mood disorder due to known physiological condition, unspecified: Secondary | ICD-10-CM

## 2022-08-16 DIAGNOSIS — F819 Developmental disorder of scholastic skills, unspecified: Secondary | ICD-10-CM

## 2022-08-16 MED ORDER — ZIPRASIDONE HCL 20 MG PO CAPS: 20.0000 mg | ORAL_CAPSULE | Freq: Two times a day (BID) | ORAL | 0 refills | Status: DC

## 2022-08-16 NOTE — Addendum Note (Signed)
Addended by: Berniece Andreas T on: 08/16/2022 04:53 PM   Modules accepted: Orders

## 2022-08-16 NOTE — Telephone Encounter (Signed)
Refill sent to her pharmacy

## 2022-08-16 NOTE — Telephone Encounter (Signed)
REFILL REQUEST --[id number]- [place], [place] - [address]  ziprasidone (GEODON) 20 MG capsule

## 2022-08-27 DIAGNOSIS — F331 Major depressive disorder, recurrent, moderate: Secondary | ICD-10-CM | POA: Diagnosis not present

## 2022-08-27 DIAGNOSIS — Z638 Other specified problems related to primary support group: Secondary | ICD-10-CM | POA: Diagnosis not present

## 2022-08-27 DIAGNOSIS — F411 Generalized anxiety disorder: Secondary | ICD-10-CM | POA: Diagnosis not present

## 2022-08-31 ENCOUNTER — Other Ambulatory Visit: Payer: Medicare Other | Admitting: Neurology

## 2022-08-31 ENCOUNTER — Other Ambulatory Visit: Payer: Self-pay

## 2022-08-31 DIAGNOSIS — G40309 Generalized idiopathic epilepsy and epileptic syndromes, not intractable, without status epilepticus: Secondary | ICD-10-CM | POA: Diagnosis not present

## 2022-09-01 DIAGNOSIS — G40309 Generalized idiopathic epilepsy and epileptic syndromes, not intractable, without status epilepticus: Secondary | ICD-10-CM | POA: Diagnosis not present

## 2022-09-08 ENCOUNTER — Other Ambulatory Visit (HOSPITAL_COMMUNITY): Payer: Self-pay | Admitting: Psychiatry

## 2022-09-08 DIAGNOSIS — F819 Developmental disorder of scholastic skills, unspecified: Secondary | ICD-10-CM

## 2022-09-08 DIAGNOSIS — F0630 Mood disorder due to known physiological condition, unspecified: Secondary | ICD-10-CM

## 2022-09-08 DIAGNOSIS — F902 Attention-deficit hyperactivity disorder, combined type: Secondary | ICD-10-CM

## 2022-09-11 DIAGNOSIS — F411 Generalized anxiety disorder: Secondary | ICD-10-CM | POA: Diagnosis not present

## 2022-09-11 DIAGNOSIS — F331 Major depressive disorder, recurrent, moderate: Secondary | ICD-10-CM | POA: Diagnosis not present

## 2022-09-11 DIAGNOSIS — Z638 Other specified problems related to primary support group: Secondary | ICD-10-CM | POA: Diagnosis not present

## 2022-09-12 ENCOUNTER — Encounter (HOSPITAL_COMMUNITY): Payer: Self-pay | Admitting: Psychiatry

## 2022-09-12 ENCOUNTER — Telehealth (HOSPITAL_BASED_OUTPATIENT_CLINIC_OR_DEPARTMENT_OTHER): Payer: Medicare Other | Admitting: Psychiatry

## 2022-09-12 VITALS — Wt 175.0 lb

## 2022-09-12 DIAGNOSIS — F902 Attention-deficit hyperactivity disorder, combined type: Secondary | ICD-10-CM | POA: Diagnosis not present

## 2022-09-12 DIAGNOSIS — F0630 Mood disorder due to known physiological condition, unspecified: Secondary | ICD-10-CM

## 2022-09-12 DIAGNOSIS — F411 Generalized anxiety disorder: Secondary | ICD-10-CM

## 2022-09-12 DIAGNOSIS — F819 Developmental disorder of scholastic skills, unspecified: Secondary | ICD-10-CM

## 2022-09-12 MED ORDER — ZIPRASIDONE HCL 20 MG PO CAPS: 20.0000 mg | ORAL_CAPSULE | Freq: Two times a day (BID) | ORAL | 0 refills | Status: DC

## 2022-09-12 MED ORDER — BUSPIRONE HCL 5 MG PO TABS: 5.0000 mg | ORAL_TABLET | Freq: Two times a day (BID) | ORAL | 0 refills | Status: DC

## 2022-09-12 NOTE — Progress Notes (Signed)
[facility] MD Virtual Progress Note   Patient Location: In car Provider Location: Home Office  I connect with patient by video and verified that I am speaking with correct person by using two identifiers. I discussed the limitations of evaluation and management by telemedicine and the availability of in person appointments. I also discussed with the patient that there may be a patient responsible charge related to this service. The patient expressed understanding and agreed to proceed.  Maria Day [id number] 27 y.o.  09/12/2022 3:06 PM  History of Present Illness:  Patient is evaluated by video session.  She is in the car and her mother driving.  Patient is not taking Geodon 20 mg 2 times a day.  Her mother reported her mood irritability and anger is much better but she still have a lot of anxiety and she does not go to grocery stores or feel safe by herself.  She still need to sleep with someone but sleep is improved.  She denies any hallucination but to still have paranoia and ruminative thoughts.  Her mother reported easily tearful and cry when she gets scared and sometimes requires supervision.  Patient denies any suicidal thoughts or homicidal thoughts.  She is seeing a neurology and recently she had an EEG and results were discussed on her next appointment with the neurology.  We have referred for neurocognitive testing but patient has not reached out for her appointment.  She is happy because she lost some weight.  Occasionally she has dizziness but denies any fall, anger.  She has sensitivity with the lights but it is stable.  Now she realized that she need to take the medicine even though she may have some side effects because it is helping her mood, irritability.  Her energy level is okay.  She had a good support from her mother.  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 Had psychological testing with IQ around 37.  H/O struggle with  grades in school but with help of VR finished high school with good grades. Took Concerta for ADD and Risperdal for mood symptoms.  No history of suicidal attempt, inpatient or abuse.    Outpatient Encounter Medications as of 09/12/2022  Medication Sig   clonazePAM (KLONOPIN) 0.5 MG tablet Take 1 tablet (0.5 mg total) by mouth 2 (two) times daily.   lamoTRIgine (LAMICTAL) 200 MG tablet Take 1 tablet (200 mg total) by mouth 2 (two) times daily.   levETIRAcetam (KEPPRA) 1000 MG tablet Take 1 tablet twice a day (Take with 500 mg tablet for a total of 1500 mg twice a day)   levETIRAcetam (KEPPRA) 500 MG tablet Take 1 tablet twice a day (Take with '1000mg'$  tablet for a total of '1500mg'$  twice a day).   ziprasidone (GEODON) 20 MG capsule Take 1 capsule (20 mg total) by mouth 2 (two) times daily with a meal.   No facility-administered encounter medications on file as of 09/12/2022.    Recent Results (from the past 2160 hour(s))  hCG, serum, qualitative     Status: None   Collection Time: 06/18/22  4:59 AM  Result Value Ref Range   Preg, Serum NEGATIVE NEGATIVE    Comment:        THE SENSITIVITY OF THIS METHODOLOGY IS >10 mIU/mL. Performed at [facility], [address] [place], [place] [address]   Urinalysis, Routine w reflex microscopic Urine, Clean Catch     Status: Abnormal   Collection Time:  06/18/22  6:00 AM  Result Value Ref Range   Color, Urine YELLOW YELLOW   APPearance HAZY (A) CLEAR   Specific Gravity, Urine >=1.030 1.005 - 1.030   pH 5.5 5.0 - 8.0   Glucose, UA NEGATIVE NEGATIVE mg/dL   Hgb urine dipstick NEGATIVE NEGATIVE   Bilirubin Urine NEGATIVE NEGATIVE   Ketones, ur NEGATIVE NEGATIVE mg/dL   Protein, ur 30 (A) NEGATIVE mg/dL   Nitrite NEGATIVE NEGATIVE   Leukocytes,Ua TRACE (A) NEGATIVE    Comment: Performed at [facility], [address] [place], [place] [address]  Urinalysis, Microscopic (reflex)     Status: Abnormal   Collection Time: 06/18/22   6:00 AM  Result Value Ref Range   RBC / HPF 0-5 0 - 5 RBC/hpf   WBC, UA 0-5 0 - 5 WBC/hpf   Bacteria, UA FEW (A) NONE SEEN   Squamous Epithelial / HPF 0-5 0 - 5   Hyaline Casts, UA PRESENT    Urine-Other LESS THAN 10 mL OF URINE SUBMITTED     Comment: Performed at [facility], [address] [place], [place] [address]  Lamotrigine level     Status: None   Collection Time: 07/10/22  9:08 AM  Result Value Ref Range   Lamotrigine Lvl 5.3 2.5 - 15.0 mcg/mL    Comment: . This test was developed and its analytical performance characteristics have been determined by [place], [place]. It has not been cleared or approved by the U.S. Food and Drug Administration. This assay has been validated pursuant to the CLIA regulations and is used for clinical purposes. .   Levetiracetam level     Status: None   Collection Time: 07/10/22  9:08 AM  Result Value Ref Range   Keppra (Levetiracetam) 23.4 mcg/mL    Comment:            Reference Range: 12.0-46.0 .            Toxic level is not well            established. Interpretation            should include a clinical            evaluation. . For additional information, please refer to http://education.[facility].com/faq/FAQ180 (This link is being provided for informational/educational purposes only.) . This test was developed and its analytical performance  characteristics have been determined by [employer]. It has not been cleared or approved by the FDA. This assay has been validated pursuant to the CLIA  regulations and is used for clinical purposes.      Psychiatric Specialty Exam: Physical Exam  Review of Systems  Weight 175 lb (79.4 kg).Body mass index is 35.35 kg/m.  General Appearance: Casual  Eye Contact:  Fair  Speech:  Slow  Volume:  Decreased  Mood:  Anxious  Affect:  Constricted and Depressed  Thought Process:  Descriptions of Associations: Intact   Orientation:  Full (Time, Place, and Person)  Thought Content:  Paranoid Ideation and Rumination  Suicidal Thoughts:  No  Homicidal Thoughts:  No  Memory:  Immediate;   Fair Recent;   Fair Remote;   Fair  Judgement:  Fair  Insight:  Shallow  Psychomotor Activity:  Increased  Concentration:  Concentration: Fair and Attention Span: Fair  Recall:  [employer] of Knowledge:  Fair  Language:  Good  Akathisia:  No  Handed:  Right  AIMS (if indicated):  Assets:  Communication Skills Desire for Improvement Housing Social Support  ADL's:  Intact  Cognition:  WNL  Sleep:  7-8 hrs, sometimes sleep in day     Assessment/Plan: Mood disorder in conditions classified elsewhere - Plan: ziprasidone (GEODON) 20 MG capsule, busPIRone (BUSPAR) 5 MG tablet  Attention deficit hyperactivity disorder, combined type - Plan: ziprasidone (GEODON) 20 MG capsule, busPIRone (BUSPAR) 5 MG tablet  Learning disorder - Plan: ziprasidone (GEODON) 20 MG capsule, busPIRone (BUSPAR) 5 MG tablet  GAD (generalized anxiety disorder) - Plan: busPIRone (BUSPAR) 5 MG tablet  Review notes from neurology.  Patient has some anxiety and nervousness and easy to cry and does not feel safe around people.  She has difficulty going to store.  Emphasis on vocational rehab and neurocognitive testing.  Patient seen neurology and recently had an EEG.  She is taking Klonopin prescribed by neurology but sometimes she feels it does not help.  Recommended she can try BuSpar 5 mg twice a day which is helpful for generalized anxiety.  Patient like to keep the Geodon 20 mg 2 times a day.  I recommend should consult with the neurology if it is safe to take as patient has seizure disorder.  Patient has plan to come back to home and of this week.  But like to have prescription sent to the local pharmacy and monitoring.  We will send a 30-day supply of Geodon and BuSpar.  We will follow-up in 1 month.  Encouraged to keep appointment with a  therapist.   Follow Up Instructions:     I discussed the assessment and treatment plan with the patient. The patient was provided an opportunity to ask questions and all were answered. The patient agreed with the plan and demonstrated an understanding of the instructions.   The patient was advised to call back or seek an in-person evaluation if the symptoms worsen or if the condition fails to improve as anticipated.    Collaboration of Care: Other provider involved in patient's care AEB notes are available in epic to review.  Patient/Guardian was advised Release of Information must be obtained prior to any record release in order to collaborate their care with an outside provider. Patient/Guardian was advised if they have not already done so to contact the registration department to sign all necessary forms in order for [place] to release information regarding their care.   Consent: Patient/Guardian gives verbal consent for treatment and assignment of benefits for services provided during this visit. Patient/Guardian expressed understanding and agreed to proceed.     I provided 27 minutes of non face to face time during this encounter.  Kathlee Nations, MD 09/12/2022

## 2022-09-13 ENCOUNTER — Telehealth: Payer: Self-pay

## 2022-09-13 ENCOUNTER — Telehealth (HOSPITAL_COMMUNITY): Payer: Self-pay | Admitting: *Deleted

## 2022-09-13 NOTE — Telephone Encounter (Signed)
She can hold the BuSpar until she get response from neurology.

## 2022-09-13 NOTE — Telephone Encounter (Signed)
Patient states she missed a call but no VM was left. Please advise.

## 2022-09-13 NOTE — Telephone Encounter (Signed)
Pt advised. She will cal this nurse back after she hears from Neurology.

## 2022-09-13 NOTE — Telephone Encounter (Signed)
I did not call her. Maybe it is a reminder call for her appt?

## 2022-09-13 NOTE — Telephone Encounter (Signed)
Pt called stating that she has concerns about taking the Buspar 5 mg BID related to having a seizure. Pt states that she has taken 2 doses. Writer advised that per Dr. Adele Schilder note pt is to consult with Neurology regarding this. Pt states that she has been leaving VM and sending messages in MyChart for Neurology office/provider so wanted to call us to get a response today. Please review and advise.

## 2022-09-13 NOTE — Telephone Encounter (Signed)
Pt called back to advise that she spoke with Neurology and was advised that the Buspar 5 mg BID is safe to take. I will call her back advising to resume medication tonight. FYI.

## 2022-09-17 ENCOUNTER — Telehealth (HOSPITAL_COMMUNITY): Payer: Self-pay | Admitting: *Deleted

## 2022-09-17 NOTE — Telephone Encounter (Signed)
Unfortunately most of the medication takes some time to get in the system.  It is her choice if she wants to continue or she can stop.  We have tried multiple medication but that seems to be a chronic issue.

## 2022-09-17 NOTE — Telephone Encounter (Signed)
Pt says she'll try taking Buspar 5 mg again tonight and if not feeling better tomorrow will d/c. Pt will update [place].

## 2022-09-17 NOTE — Telephone Encounter (Signed)
Pt called regarding concerns about Buspar 5 mg BID. Pt says she is having trouble staying awake after taking medication. Pt takes @ 1100 and 2100. Please review and advise.

## 2022-09-25 ENCOUNTER — Telehealth (INDEPENDENT_AMBULATORY_CARE_PROVIDER_SITE_OTHER): Payer: Medicare Other | Admitting: Neurology

## 2022-09-25 ENCOUNTER — Encounter: Payer: Self-pay | Admitting: Neurology

## 2022-09-25 VITALS — Ht 59.0 in | Wt 180.0 lb

## 2022-09-25 DIAGNOSIS — G40309 Generalized idiopathic epilepsy and epileptic syndromes, not intractable, without status epilepticus: Secondary | ICD-10-CM | POA: Diagnosis not present

## 2022-09-25 DIAGNOSIS — F419 Anxiety disorder, unspecified: Secondary | ICD-10-CM | POA: Diagnosis not present

## 2022-09-25 NOTE — Progress Notes (Signed)
Virtual Visit via Video Note The purpose of this virtual visit is to provide medical care while limiting exposure to the novel coronavirus.    Consent was obtained for video visit:  Yes.   Answered questions that patient had about telehealth interaction:  Yes.   I discussed the limitations, risks, security and privacy concerns of performing an evaluation and management service by telemedicine. I also discussed with the patient that there may be a patient responsible charge related to this service. The patient expressed understanding and agreed to proceed.  Pt location: Home Physician Location: office Name of referring provider:  No ref. provider found I connected with Michelene Heady at patients initiation/request on 09/25/2022 at  9:00 AM EDT by video enabled telemedicine application and verified that I am speaking with the correct person using two identifiers. Pt MRN:  [id number] Pt DOB:  04-13-1996 Video Participants:  Michelene Heady;  Oretha Ellis (mother)   History of Present Illness:  The patient had a virtual video visit on 09/25/2022. Her mother Tye [place] was also present to provide additional information. On her last visit, she reported feeling dizzy, light sensitive, feeling off where she did not feel like she was in her body. She had a 48-hour EEG from March 1-3, 2024 which I personally reviewed. She reports having these typical symptoms during the 48-hour EEG which was normal, there were no epileptiform discharges or seizure activity seen during push button events. She is on Levetiracetam 1500mg  BID and Lamotrigine 200mg  BID with no side effects. She has also been taking clonazepam 0.5mg  BID for anxiety. They deny any seizures since 03/2022, her mother has not witnessed any staring/unresponsive episodes. She continues to work closely with her psychiatrist Dr. Adele Schilder and her therapist. She continues to feel very anxious but does not that the Buspar seems to be working, the morning dose just  makes her very sleepy where she has to take a nap after taking it. She continues to report being very light sensitive (no headaches), not wanting to go into stores because the light bother her. Symptoms are worse at night. She states it "just messes with my brain." She started the Modified Atkins diet a little over 1.5 months ago and has been doing well with it, she has lost 8 lbs. She would like to resume driving since she has been seizure-free since September 2023, however her mother is concerned about her significant anxiety and driving. Her therapist is in the process of getting her a peer partner to go places with her.     History on Initial Assessment 04/04/2022: This is a 27 year old right-handed woman with a history of ADD, OSA, and seizures, presenting to establish care. I had previously seen her in 2018 however she then went to [facility] in 2019-2022 for further epilepsy management. She was started on Lamotrigine in 2019. She was seizure-free from 2018 to 08/2020 when she called about a blackout episode with no body jerking, then called to report more seizures with grand mal and staring episodes, Lamotrigine increased to 100-200mg  in 11/2020. Levetiracetam increased to 1000mg  BID in 12/2020. She then saw epileptologist Dr. April Manson in 04/2021. They were reporting mild body tremors, inability to respond to verbal stimuli. Routine and Ambulatory EEG were normal in 06/2021. She was admitted to the EMU from February 20-24, 2023 where baseline EEG showed generalized spikes and polyspikes with right frontal predominance, predominantly seen during sleep. Seizure medications were discontinued and 2 events were captured. The first seizure was  on 2/21, she was initially off camera for the first minute, then she was seen spinning in counterclockwise direction, left arm elevated with subtle jerking, then fell. Left arm and leg were extended, right arm flexed and adductor, leg flexed and abducted followed by  figure-of-four posturing with left arm extension and right arm flexion. Onset showed generalized and maximal right frontal spike followed by 6-7 Hz theta activity which appeared generalized and maximal in right hemisphere. The second seizure was on 2/23, after about 15 seconds of EEG seizure, she had left upper extremity flexed, elevated, and abducted with patient turning to the left and forced left gaze deviation. She was holding something in the right arm which was flexed and adducted, then she had figure-of-four posturing with left upper extremity extension and right upper extremity flexion, followed by convulsion. EEG onset showed similar changes to prior seizure. 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 She was discharged home on Levetiracetam 1500mg  BID, Lamotrigine 200mg  BID, and Clobazam 10mg  daily was added to her regimen. On her follow-up with Dr. April Manson in 11/2021, she continued to report out of body phenomenon and feeling like she would have a seizure, as well as lethargy/fatigue on Clobazam. Symptoms were felt due to anxiety as she is under a lot of stress, and clonazepam 25mg  BID was started. She did well on the clonazepam but when she ran out, she was started on Hydroxyzine, which made her feel weird. She was prescribed Lacosamide but did not start it. She felt being off clonazepam led to increase in seizure-like activity. She was advised to follow-up with Psychiatry but has not been able to. She saw her PCP in [place]. [name] who "agreed with me that Fluoxetine is not working," she patient self-discontinued it a month ago. She states the clonazepam really helps calm her anxiety, when she dose not take it, anxiety is almost uncontrollable. She states that she has "very, very, very bad anxiety," and has not felt like herself in a very long time. She feels like she is an alien in her body. She is scared to go out  of the house, which is new since February.   Her mother reports that the episodes where she would get nauseated, crunch down and shake (not violently) had stopped since February, however her convulsive seizures have recurred where she is foaming at the mouth and "breathing bad." She states her left hand would stiffen up, then she wakes up on the floor. She states this is new and started after her EMU admission. Her aunt recalls a seizure at the beach last May, she was sitting on the couch then it looked like she was grabbing something on her left side with left arm extended, turned in a circle, and fell out. She vomited after and had a horrible headache. Last seizure was 2 weeks ago, she was getting something out of the dryer then her left hand stiffened up and she woke up on the floor. Her grandmother reported she was foaming at the mouth, eyes rolled back, seizure lasted 5 minutes. She feels nauseated and weak after, no focal weakness. She typically has headaches after a seizure. Yesterday she had a "sick headache" with nausea, no vomiting and wears sunglasses in the office today.  She has a contentious relationship with her mother. She was previously living in her own apartment, alternating between her apartment and father's house. Her father was recently diagnosed with  a medical condition and she moved back in with her mother. She feels her mother wants to control everything. Her mother and aunt expressed concern that about living alone and making decisions to live on her own, stating she has had troubles in her life processing issues, needing reminders to brush her teeth for instance. Prior records from Dr. Gaynell Face indicated she had psychoeducational testing that revealed an IQ of 85. Genetic testing was normal. She lost 2 jobs and is on disability.  Epilepsy Risk Factors:  She had a febrile seizure at age 75. Her mother reports there was a "knot in the umbilical cord" and meconium in amniotic fluid, she  needed antibiotics for a few days. Otherwise she had a normal early development.  There is no history of CNS infections such as meningitis/encephalitis, significant traumatic brain injury, neurosurgical procedures, or family history of seizures.   Prior ASMs: Levetiracetam, Zonisamide, Topiramate, clobazam (lethargy and fatigue)  Diagnostic Data: Brain MRI with and without contrast in 2018 was normal. Brain MRI with and without contrast done 05/14/2022 was normal, hippocampi symmetric with no abnormal signal or enhancement seen.  EEG in 2019 was abnormal due to the presence of occasional generalized irregular high voltage 4-5 Hz spike and polyspike and wave discharges, some with right-sided predominance.  Routine EEG in 06/2021 was normal.  Ambulatory EEG in 06/2021 was normal, one event of seeing flashes of light when closing eyes with no changes in the EEG background. A normal EEG does not exclude a diagnosis of epilepsy.  EMU admission 08/21/21 to 08/25/21 captured 2 events which were consistent with seizures, with generalized onset.  However, focal epilepsy from right frontal region could also have similar appearance.  Unfortunately, we were unable to capture the new events patient has had during which patient does not feel well, comes to her mother and then has full body shaking.  It is possible that patient has coexisting nonepileptic events.    Laboratory Data from 07/2022: Levetiracetam level 23.4 Lamotrigine level 5.3    Current Outpatient Medications on File Prior to Visit  Medication Sig Dispense Refill   busPIRone (BUSPAR) 5 MG tablet Take 1 tablet (5 mg total) by mouth 2 (two) times daily. 60 tablet 0   clonazePAM (KLONOPIN) 0.5 MG tablet Take 1 tablet (0.5 mg total) by mouth 2 (two) times daily. 60 tablet 5   lamoTRIgine (LAMICTAL) 200 MG tablet Take 1 tablet (200 mg total) by mouth 2 (two) times daily. 180 tablet 3   levETIRAcetam (KEPPRA) 1000 MG tablet Take 1 tablet twice a  day (Take with 500 mg tablet for a total of 1500 mg twice a day) 180 tablet 3   levETIRAcetam (KEPPRA) 500 MG tablet Take 1 tablet twice a day (Take with 1000mg  tablet for a total of 1500mg  twice a day). 180 tablet 3   ziprasidone (GEODON) 20 MG capsule Take 1 capsule (20 mg total) by mouth 2 (two) times daily with a meal. 60 capsule 0   No current facility-administered medications on file prior to visit.     Observations/Objective:   Vitals:   09/25/22 0821  Weight: 180 lb (81.6 kg)  Height: 4\' 11"  (1.499 m)   GEN:  The patient appears stated age and is in NAD.  Neurological examination: Patient is awake, alert. No aphasia or dysarthria. Intact fluency and comprehension. Cranial nerves: Extraocular movements intact. No facial asymmetry. Motor: moves all extremities symmetrically, at least anti-gravity x 4. Gait: narrow-based and steady.   Assessment and Plan:  This is a 27 yo RH woman with a history of ADD, OSA, anxiety, depression, and seizures. She has had seizures since childhood with generalized convulsions and myoclonic jerks. During her EMU admission in February 2023, she had 2 convulsive seizures with focal clinical features, EEG pattern could be seen with generalized epilepsy however focal epilepsy from the right frontal region could also have similar appearance. MRI brain normal. She has not had any typical seizures since 03/2022. She continued to report feelings like she would have a seizure, feeling off like she was not in her body, dizzy, anxious. We discussed her 48-hour EEG which was completely normal, we had an extensive discussion that these symptoms are not seizure-related, there is no need to change her seizure medications, and she should continue on plan with Psychiatry (Geodon, Buspar). She can continue the clonazepam 0.5mg  BID as she works with Psychiatry on her anxiety, we can plan to wean this off in the future once anxiety is hopefully better controlled with her other  medications and with psychotherapy. We discussed [place] driving laws, from a seizure standpoint, she has been 6 months seizure-free, however her mother is concerned about her significant anxiety affecting driving, she will discuss this with her Behavioral Health providers. All their questions were answered to the best of my abilities. Follow-up in 3 months, call for any changes.    Follow Up Instructions:   -I discussed the assessment and treatment plan with the patient and mother. The patient  and mother were provided an opportunity to ask questions and all were answered. The patient  and mother agreed with the plan and demonstrated an understanding of the instructions.   The patient  and mother were advised to call back or seek an in-person evaluation if the symptoms worsen or if the condition fails to improve as anticipated.      Cameron Sprang, MD

## 2022-09-25 NOTE — Patient Instructions (Addendum)
Good to see you.  Continue all your medications  2. Here is the information for the [facility] Telephone: [phone] Local: [phone] Email: [email or web address]  3. Continue working with your psychiatrist and therapist on anxiety  4. Follow-up in 3 months, call for any changes   Seizure Precautions: 1. If medication has been prescribed for you to prevent seizures, take it exactly as directed.  Do not stop taking the medicine without talking to your doctor first, even if you have not had a seizure in a long time.   2. Avoid activities in which a seizure would cause danger to yourself or to others.  Don't operate dangerous machinery, swim alone, or climb in high or dangerous places, such as on ladders, roofs, or girders.  Do not drive unless your doctor says you may.  3. If you have any warning that you may have a seizure, lay down in a safe place where you can't hurt yourself.    4.  No driving for 6 months from last seizure, as per [facility].   Please refer to the following link on the [facility] website for more information: http://[email or web address]/drivingu.cfm   5.  Maintain good sleep hygiene. Avoid alcohol.  6.  Notify your neurology if you are planning pregnancy or if you become pregnant.  7.  Contact your doctor if you have any problems that may be related to the medicine you are taking.  8.  Call 911 and bring the patient back to the ED if:        A.  The seizure lasts longer than 5 minutes.       B.  The patient doesn't awaken shortly after the seizure  C.  The patient has new problems such as difficulty seeing, speaking or moving  D.  The patient was injured during the seizure  E.  The patient has a temperature over 102 F (39C)  F.  The patient vomited and now is having trouble breathing

## 2022-09-30 ENCOUNTER — Other Ambulatory Visit: Payer: Self-pay | Admitting: Neurology

## 2022-09-30 DIAGNOSIS — F419 Anxiety disorder, unspecified: Secondary | ICD-10-CM

## 2022-10-01 ENCOUNTER — Telehealth (HOSPITAL_COMMUNITY): Payer: Self-pay | Admitting: *Deleted

## 2022-10-01 ENCOUNTER — Other Ambulatory Visit: Payer: Self-pay | Admitting: Neurology

## 2022-10-01 DIAGNOSIS — G40309 Generalized idiopathic epilepsy and epileptic syndromes, not intractable, without status epilepticus: Secondary | ICD-10-CM

## 2022-10-01 DIAGNOSIS — F419 Anxiety disorder, unspecified: Secondary | ICD-10-CM

## 2022-10-01 MED ORDER — CLONAZEPAM 0.5 MG PO TABS: 0.5000 mg | ORAL_TABLET | Freq: Two times a day (BID) | ORAL | 0 refills | Status: DC

## 2022-10-01 NOTE — Telephone Encounter (Signed)
Her current psychiatric medication should not interfere with the Tylenol.  She can take over-the-counter Tylenol as needed for pain.

## 2022-10-01 NOTE — Telephone Encounter (Signed)
Writer returned pt call, she LVM for this nurse. Pt did not say what specifically it was concerning. Writer called pt back however had to her a VM. Will attempt again.

## 2022-10-01 NOTE — Telephone Encounter (Signed)
Writer spoke with pt who called earlier leaving a couple VM's. Pt wanted your opinion on wether or not it is safe for her to take Tylenol for her what sounds like chronic headaches. Pt is asking because she doesn't want to "mess up" her current med regime. Pt is not currently taking anything with Acetaminophen or Ibuprofen in it. Please review.

## 2022-10-02 NOTE — Telephone Encounter (Signed)
I will advise pt

## 2022-10-04 ENCOUNTER — Other Ambulatory Visit: Payer: Self-pay | Admitting: Neurology

## 2022-10-04 ENCOUNTER — Other Ambulatory Visit: Payer: Self-pay

## 2022-10-04 DIAGNOSIS — F419 Anxiety disorder, unspecified: Secondary | ICD-10-CM

## 2022-10-04 DIAGNOSIS — G40309 Generalized idiopathic epilepsy and epileptic syndromes, not intractable, without status epilepticus: Secondary | ICD-10-CM

## 2022-10-04 MED ORDER — CLONAZEPAM 0.5 MG PO TABS: 0.5000 mg | ORAL_TABLET | Freq: Two times a day (BID) | ORAL | 0 refills | Status: DC

## 2022-10-09 ENCOUNTER — Telehealth: Payer: Self-pay

## 2022-10-09 NOTE — Telephone Encounter (Signed)
Called and informed pt of Dr. Rosalyn Gess answer she voiced understanding.

## 2022-10-09 NOTE — Telephone Encounter (Signed)
Patient states that she sent a message requesting that someone call her to discuss medication side effect. States she isn't comfortable with telling me and would prefer to speak to someone clinical. Asked if someone can call her back so she can discuss her concern.

## 2022-10-09 NOTE — Telephone Encounter (Signed)
Pls let her know that I have been in touch with Dr. Lolly Mustache who agrees that her symptoms are not neurological and he will continue working with her on these symptoms she is feeling. She has a f/u with him tomorrow. Thanks

## 2022-10-10 ENCOUNTER — Telehealth (HOSPITAL_BASED_OUTPATIENT_CLINIC_OR_DEPARTMENT_OTHER): Payer: Medicare Other | Admitting: Psychiatry

## 2022-10-10 ENCOUNTER — Encounter (HOSPITAL_COMMUNITY): Payer: Self-pay | Admitting: Psychiatry

## 2022-10-10 DIAGNOSIS — F902 Attention-deficit hyperactivity disorder, combined type: Secondary | ICD-10-CM | POA: Diagnosis not present

## 2022-10-10 DIAGNOSIS — F819 Developmental disorder of scholastic skills, unspecified: Secondary | ICD-10-CM

## 2022-10-10 DIAGNOSIS — F411 Generalized anxiety disorder: Secondary | ICD-10-CM

## 2022-10-10 DIAGNOSIS — F0630 Mood disorder due to known physiological condition, unspecified: Secondary | ICD-10-CM | POA: Diagnosis not present

## 2022-10-10 MED ORDER — BUSPIRONE HCL 5 MG PO TABS: 5.0000 mg | ORAL_TABLET | Freq: Three times a day (TID) | ORAL | 0 refills | Status: DC

## 2022-10-10 MED ORDER — ZIPRASIDONE HCL 40 MG PO CAPS: 40.0000 mg | ORAL_CAPSULE | Freq: Two times a day (BID) | ORAL | 0 refills | Status: DC

## 2022-10-10 NOTE — Progress Notes (Signed)
[facility] MD Virtual Progress Note   Patient Location: In Car Provider Location: Home Office  I connect with patient by video and verified that I am speaking with correct person by using two identifiers. I discussed the limitations of evaluation and management by telemedicine and the availability of in person appointments. I also discussed with the patient that there may be a patient responsible charge related to this service. The patient expressed understanding and agreed to proceed.  Maria Day [id number] 27 y.o.  10/10/2022 3:33 PM  History of Present Illness:  Patient evaluated by video session.  She is in the car and her father is driving.  Currently she is staying with her father in [place].  She continues to struggle with anxiety and nervousness.  She is in touch with neurology and she was told she has no seizure.  She is taking Keppra, Lamictal.  She reported continued to have episodes when she feels very nervous, anxious.  Last week her friend told to go to the store to buy things but she have to leave the store because she was very anxious and nervous, paranoid.  We have recommended to have a discussion with the neurology about neurocognitive testing but she is still waiting.  She still have irritability, anger, mood swing, nightmares at night.  She sleeps during the day a lot but at night she gets very scared.  She admitted her sleep cycle is messed up but she feels very nervous and anxious at bedtime.  We have prescribed BuSpar and Geodon.  She admitted sometimes missed the dose of the BuSpar.  She also taking Klonopin prescribed by neurology.  Her crying spells are better and she denies recent suicidal thoughts or any hallucination.  Her appetite is fair.  Her weight is stable.  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 Had psychological testing with IQ around 2.  H/O struggle with grades in school but with help of VR finished  high school with good grades. Took Concerta for ADD and Risperdal for mood symptoms.  No history of suicidal attempt, inpatient or abuse.    Outpatient Encounter Medications as of 10/10/2022  Medication Sig   busPIRone (BUSPAR) 5 MG tablet Take 1 tablet (5 mg total) by mouth 2 (two) times daily.   clonazePAM (KLONOPIN) 0.5 MG tablet Take 1 tablet (0.5 mg total) by mouth 2 (two) times daily.   lamoTRIgine (LAMICTAL) 200 MG tablet Take 1 tablet (200 mg total) by mouth 2 (two) times daily.   levETIRAcetam (KEPPRA) 1000 MG tablet Take 1 tablet twice a day (Take with 500 mg tablet for a total of 1500 mg twice a day)   levETIRAcetam (KEPPRA) 500 MG tablet Take 1 tablet twice a day (Take with 1000mg  tablet for a total of 1500mg  twice a day).   ziprasidone (GEODON) 20 MG capsule Take 1 capsule (20 mg total) by mouth 2 (two) times daily with a meal.   No facility-administered encounter medications on file as of 10/10/2022.    No results found for this or any previous visit (from the past 2160 hour(s)).   Psychiatric Specialty Exam: Physical Exam  Review of Systems  Weight 177 lb (80.3 kg).There is no height or weight on file to calculate BMI.  General Appearance: Casual  Eye Contact:  Fair  Speech:  Slow  Volume:  Decreased  Mood:  Anxious  Affect:  Constricted and Depressed  Thought Process:  Descriptions of Associations: Intact  Orientation:  Full (Time, Place, and Person)  Thought Content:  Paranoid Ideation and Rumination  Suicidal Thoughts:  No  Homicidal Thoughts:  No  Memory:  Immediate;   Fair Recent;   Fair Remote;   Fair  Judgement:  Fair  Insight:  Shallow  Psychomotor Activity:  Increased  Concentration:  Concentration: Fair and Attention Span: Fair  Recall:  [employer] of Knowledge:  Fair  Language:  Fair  Akathisia:  No  Handed:  Right  AIMS (if indicated):     Assets:  Communication Skills Desire for Improvement Housing Social Support  ADL's:  Intact  Cognition:   WNL  Sleep:  in day time     Assessment/Plan: Mood disorder in conditions classified elsewhere - Plan: busPIRone (BUSPAR) 5 MG tablet, ziprasidone (GEODON) 40 MG capsule  Attention deficit hyperactivity disorder, combined type - Plan: busPIRone (BUSPAR) 5 MG tablet, ziprasidone (GEODON) 40 MG capsule  GAD (generalized anxiety disorder) - Plan: busPIRone (BUSPAR) 5 MG tablet  Learning disorder - Plan: busPIRone (BUSPAR) 5 MG tablet, ziprasidone (GEODON) 40 MG capsule  Discussed associated symptoms and anxiety disorder.  Encouraged to keep the medication as prescribed and we will increase the dose of BuSpar to take 5 mg 3 times a day and Geodon 40 mg 2 times a day.  Now the neurology has cleared her to take the Geodon I discuss maybe optimizing the dose will be helpful.  She started therapy with Ms. Dixon and working to get vocational rehab referral.  We talk about considering neuropsych testing that may help her to get more resources to get vocational rehab.  She does feel very anxious and nervous around people.  Encouraged to continue therapy with Ms. Dixon.  Patient has learning disability and requires a lot of reassurance and redirection.  I recommend should write it down the directions of the medication and have a reminder so she did not miss the dose.  She will try BuSpar 5 mg 3 times a day and Geodon 40 mg 2 times a day.  We will follow-up in a month.   Follow Up Instructions:     I discussed the assessment and treatment plan with the patient. The patient was provided an opportunity to ask questions and all were answered. The patient agreed with the plan and demonstrated an understanding of the instructions.   The patient was advised to call back or seek an in-person evaluation if the symptoms worsen or if the condition fails to improve as anticipated.    Collaboration of Care: Other provider involved in patient's care AEB notes are available in epic to review.  Patient/Guardian was  advised Release of Information must be obtained prior to any record release in order to collaborate their care with an outside provider. Patient/Guardian was advised if they have not already done so to contact the registration department to sign all necessary forms in order for [place] to release information regarding their care.   Consent: Patient/Guardian gives verbal consent for treatment and assignment of benefits for services provided during this visit. Patient/Guardian expressed understanding and agreed to proceed.     I provided 30 minutes of non face to face time during this encounter.  Note: This document was prepared by [employer] voice dictation technology and any errors that results from this process are unintentional.    Cleotis Nipper, MD 10/10/2022

## 2022-10-11 DIAGNOSIS — F331 Major depressive disorder, recurrent, moderate: Secondary | ICD-10-CM | POA: Diagnosis not present

## 2022-10-11 DIAGNOSIS — F411 Generalized anxiety disorder: Secondary | ICD-10-CM | POA: Diagnosis not present

## 2022-10-11 DIAGNOSIS — Z638 Other specified problems related to primary support group: Secondary | ICD-10-CM | POA: Diagnosis not present

## 2022-10-12 ENCOUNTER — Other Ambulatory Visit: Payer: Self-pay | Admitting: Neurology

## 2022-10-12 DIAGNOSIS — G40309 Generalized idiopathic epilepsy and epileptic syndromes, not intractable, without status epilepticus: Secondary | ICD-10-CM

## 2022-10-15 ENCOUNTER — Telehealth: Payer: Self-pay | Admitting: Neurology

## 2022-10-15 NOTE — Telephone Encounter (Signed)
Patient wants to speak to someone about the research she has done about this stuff called Flower (like CBD ) is suppose to help with Seizure and things . She wants to know what Dr [name] thinks about it

## 2022-10-16 ENCOUNTER — Telehealth (HOSPITAL_COMMUNITY): Payer: Self-pay | Admitting: *Deleted

## 2022-10-16 NOTE — Telephone Encounter (Signed)
Dr Lolly Mustache Patient called & stated that medication increase is ""too much for her"" "it's to strong"..    busPIRone (BUSPAR) 5 MG tablet * Take 1 tablet (5 mg total) by mouth 3 (three) times daily   ziprasidone (GEODON) 40 MG capsule  *Take 1 capsule (40 mg total) by mouth 2 (two) times daily with a meal.   Patient has asked for a return call @ [phone]

## 2022-10-16 NOTE — Telephone Encounter (Signed)
Pt called informed she needs to send her research of flower via my chart to Dr [name]

## 2022-10-16 NOTE — Telephone Encounter (Signed)
I have not heard of Flower. Pls have her send her research to me on MyChart and I can review. Thanks

## 2022-10-16 NOTE — Telephone Encounter (Signed)
She is taking less than recommended dose.  In order to get a better control on her anxiety she may need to take at least a therapeutic dose.  She had called multiple times to our office and we did a lot of medication adjustment on the phone.  I I highly recommend that her next appointment should be in person to clarify her symptoms and treatment.

## 2022-10-16 NOTE — Telephone Encounter (Signed)
Writer called and spoke with pt regarding her medication concerns. Pt says she wasn't sure about not knowing when to take the Buspar and felt TID was too much anyway. Pt says she feels like a "zombie" when she takes Buspar 5 mg TID and asked if she needed to take it TID. This nurse advised pt on administration times; morning, noon, evening. And that she needs to be consistent so that she is taking a therapeutic dose. Pt verbalized understanding. Writer also reviewed all meds and advised that she is on other medications that can be sedating. Pt verbalized understanding. Writer advised pt that next visit should be in office and after discussing pt was transferred to front desk and secure chat sent to front desk as well. FYI.

## 2022-10-17 ENCOUNTER — Telehealth (HOSPITAL_COMMUNITY): Payer: Medicare Other | Admitting: Psychiatry

## 2022-10-19 ENCOUNTER — Telehealth (HOSPITAL_COMMUNITY): Payer: Self-pay | Admitting: *Deleted

## 2022-10-19 NOTE — Telephone Encounter (Signed)
Pt LVM this morning asking if she can go back on the Buspar 5 TID because her family thinks it's a good idea. Message was a bit difficult to hear so writer called pt back to verify it is the Buspar 5 mg, as this nurse has discussed several times with pt that she should be taking medication TID, but had to leave a VM. Writer advised pt return my call and if she's gets my VM please leave a detailed message so I may send to Dr. Lolly Mustache prior to 19 N as office will be closing then. Pt has called back 4 times leaving VM just asking for return call. Writer has left two VM's for pt. FYI. Writer has returned each of pt's calls.

## 2022-10-19 NOTE — Telephone Encounter (Signed)
She should be seen in person next time.  She has left several messages and then communication has been an issue.  Having a visit in person will help clarify.

## 2022-10-22 NOTE — Telephone Encounter (Signed)
Set message to front desk

## 2022-10-23 DIAGNOSIS — F411 Generalized anxiety disorder: Secondary | ICD-10-CM | POA: Diagnosis not present

## 2022-10-23 DIAGNOSIS — Z638 Other specified problems related to primary support group: Secondary | ICD-10-CM | POA: Diagnosis not present

## 2022-10-23 DIAGNOSIS — F331 Major depressive disorder, recurrent, moderate: Secondary | ICD-10-CM | POA: Diagnosis not present

## 2022-10-26 ENCOUNTER — Telehealth (HOSPITAL_COMMUNITY): Payer: Medicare Other | Admitting: Psychiatry

## 2022-10-29 ENCOUNTER — Telehealth (HOSPITAL_COMMUNITY): Payer: Self-pay | Admitting: *Deleted

## 2022-10-29 NOTE — Telephone Encounter (Signed)
Writer discussed that schedule with pt previously and she says that she felt "horrible" so is taking 3rd dose @ 2100.

## 2022-10-29 NOTE — Telephone Encounter (Signed)
Pt called with c/o "not sleeping". Pt says that since she's been taking Buspar 5 mg TID, her mind is racing at bedtime and she typically takes about 2 hours to fall asleep. Pt also states that once she falls asleep she is tossing and turning all night. Pt admits anxiety is " a little bit better" since increasing dose (now taking TID). We discussed sleep hygiene and pt states that she tries to adhere to routine of no phone or TV for 2 hours prior to going to bed. Sleeps in a quite, cool, dark room. Not eating or drinking before bed. Pt says she has an in office appointment this week. Please review and advise.

## 2022-10-29 NOTE — Telephone Encounter (Signed)
She should take Buspar 8 am, 12 pm and 5 pm. Enough time to take before go to sleep.

## 2022-10-29 NOTE — Telephone Encounter (Signed)
She had a lot of questions and require a lot of reassurance.  I have explained in detail about how to take the medication.  She need to stay with the treatment plan.  As I said earlier she can take 8 AM, 12 PM and 5 PM.  She need to give more time to the medication rapid changing the dosage and timing will not help as such.

## 2022-10-30 ENCOUNTER — Telehealth (HOSPITAL_COMMUNITY): Payer: Self-pay | Admitting: Psychiatry

## 2022-10-31 ENCOUNTER — Other Ambulatory Visit: Payer: Self-pay | Admitting: Neurology

## 2022-10-31 ENCOUNTER — Telehealth (HOSPITAL_COMMUNITY): Payer: Self-pay | Admitting: Psychiatry

## 2022-10-31 DIAGNOSIS — G40309 Generalized idiopathic epilepsy and epileptic syndromes, not intractable, without status epilepticus: Secondary | ICD-10-CM

## 2022-10-31 DIAGNOSIS — F419 Anxiety disorder, unspecified: Secondary | ICD-10-CM

## 2022-10-31 NOTE — Telephone Encounter (Signed)
D:  Maria Ahmadi, LPN referred pt to virtual MH-IOP.  A:  Placed call to orient/discuss with pt.  Pt states she didn't know anything about MH-IOP.  According to pt, she has been waiting on assistance with obtaining a pal.  Case manager informed pt about [facility] and possibly her calling to find out more about peer support option.  Case manager provided pt with [facility]'s phone # [phone].  Inform Elon Jester.

## 2022-11-01 ENCOUNTER — Other Ambulatory Visit: Payer: Self-pay | Admitting: Anesthesiology

## 2022-11-01 ENCOUNTER — Telehealth (HOSPITAL_COMMUNITY): Payer: Self-pay | Admitting: Psychiatry

## 2022-11-01 DIAGNOSIS — G40309 Generalized idiopathic epilepsy and epileptic syndromes, not intractable, without status epilepticus: Secondary | ICD-10-CM

## 2022-11-01 DIAGNOSIS — F419 Anxiety disorder, unspecified: Secondary | ICD-10-CM

## 2022-11-01 MED ORDER — CLONAZEPAM 0.5 MG PO TABS: 0.5000 mg | ORAL_TABLET | Freq: Two times a day (BID) | ORAL | 5 refills | Status: DC

## 2022-11-01 NOTE — Telephone Encounter (Signed)
Pt left message stating her clonazepam Rx was sent to [facility], however she is not in [place] anymore she is in [place]. States she would like her Rx be sent to [facility] on [address].

## 2022-11-01 NOTE — Telephone Encounter (Signed)
Rx sent, thanks 

## 2022-11-01 NOTE — Telephone Encounter (Signed)
Called patient and made her aware of RX sent

## 2022-11-01 NOTE — Telephone Encounter (Signed)
D:  Dr. Lolly Mustache is requesting a community support team to be involved with pt.  A:  MH-IOP Case Manager looked up resources to assist.  A referral would have to be made to [facility] [phone]), [employer] [phone]) or [facility].  Informed Delanna Ahmadi, LPN.

## 2022-11-05 ENCOUNTER — Ambulatory Visit: Payer: Medicare Other | Admitting: Psychology

## 2022-11-05 ENCOUNTER — Encounter: Payer: Self-pay | Admitting: Psychology

## 2022-11-05 ENCOUNTER — Ambulatory Visit (INDEPENDENT_AMBULATORY_CARE_PROVIDER_SITE_OTHER): Payer: Medicare Other | Admitting: Psychology

## 2022-11-05 DIAGNOSIS — R41840 Attention and concentration deficit: Secondary | ICD-10-CM

## 2022-11-05 DIAGNOSIS — F411 Generalized anxiety disorder: Secondary | ICD-10-CM | POA: Diagnosis not present

## 2022-11-05 DIAGNOSIS — F70 Mild intellectual disabilities: Secondary | ICD-10-CM | POA: Insufficient documentation

## 2022-11-05 DIAGNOSIS — F329 Major depressive disorder, single episode, unspecified: Secondary | ICD-10-CM

## 2022-11-05 DIAGNOSIS — F331 Major depressive disorder, recurrent, moderate: Secondary | ICD-10-CM | POA: Diagnosis not present

## 2022-11-05 DIAGNOSIS — G40309 Generalized idiopathic epilepsy and epileptic syndromes, not intractable, without status epilepticus: Secondary | ICD-10-CM | POA: Diagnosis not present

## 2022-11-05 DIAGNOSIS — R4189 Other symptoms and signs involving cognitive functions and awareness: Secondary | ICD-10-CM

## 2022-11-05 HISTORY — DX: Generalized anxiety disorder: F41.1

## 2022-11-05 HISTORY — DX: Mild intellectual disabilities: F70

## 2022-11-05 HISTORY — DX: Major depressive disorder, single episode, unspecified: F32.9

## 2022-11-05 NOTE — Progress Notes (Signed)
NEUROPSYCHOLOGICAL EVALUATION [place]. [facility] Department of Neurology  Date of Evaluation: Nov 05, 2022  Reason for Referral:   Maria Day is a 27 y.o. left-handed Caucasian female referred by Patrcia Dolly, M.D., to characterize her current cognitive functioning and assist with diagnostic clarity and treatment planning in the context of subjective cognitive dysfunction with a history history of ongoing seizure activity, ADHD, psychiatric comorbidities, and prior concerns surrounding borderline intellectual abilities.   Assessment and Plan:   Clinical Impression(s): Across formal intelligence testing (WAIS-IV), Maria Day full-scale IQ (FSIQ) was 66 (1st percentile). There was not a large degree of difference between her FSIQ, her general abilities index (GAI; 67, 1st percentile), or performance across her verbal comprehension index (VCI; 70, 2nd percentile). Her IQ ranges align with the upper limits of a mild intellectual disability classification.  Across neurocognitive testing, normative performances suggest diffuse impairment. However, when interpreted relative to estimated premorbid intellectual estimations (i.e., IQ testing), performances are generally in line with expectation. More consistent weaknesses were exhibited across complex attention and verbal fluency (i.e., more rapid generation of speech and word finding). Variability (but weakness overall) was further suggested across processing speed, executive functioning (i.e., multi-tasking, problem solving), and visuospatial processing. Specific to memory, Maria Day demonstrated primary difficulties encoding (i.e., learning) novel information, likely impacted by deficits in processing speed and attention/concentration. Delayed memory performances were improved relative to initial learning efforts, suggesting that she is able to retain learned information adequately.  Put more plainly, Maria Day processes  information slower and less efficiently than her peers, while also having greater trouble with inattention and distractibility. This, in turn, worsens her ability to multi-task and problem solve in the moment. Trouble with processing speed and attention also lessens the amount of information she is able to learn at one time. While she may then have trouble recalling this information on the spot later on, she generally performs well when provided a cue or reminder to help jog her memory. Memory was also improved when information was pre-organized for her and placed into a familiar context.   The underlying culprit for neurocognitive dysfunction is multifactorial in nature. Historically, Maria Day has experienced generalized seizures for many years, with her first representing a febrile seizure at age 54. She was diagnosed with ADHD in elementary school and also reported quite severe psychiatric distress (predominantly anxiety) over the years. When combined with intellectual abilities, all of these variables, whether in isolation or especially when combined, could create her exhibited pattern of weakness. Current medications, especially clonazepam and anti-epileptic drugs (AEDs), also have well known cognitive side effects which could exacerbate deficits from the aforementioned variables. Given the relative severity of all these variables, I am unable to create an objective hierarchy of sources of dysfunction.   Recommendations: Maria Day and her mother both expressed interest in vocational rehabilitation services, either to provide Maria Day with meaningful employment opportunities or steady volunteer work. I think this would be an excellent idea, I fully support this, and would encourage them to contact their local office to initiate these services.   Employment/volunteer work should likely focus on positions without rapidly changing demands that limit the potential for anxiety provoking or stress inducing  situations (e.g., customer facing positions). She would likely be successful in routine-based positions, especially if position expectations are clearly outlined in both written and verbal form at the outset. Some repetition may be required in order to facilitate her learning of new job tasks.  She should  continue to work with her psychiatrist for medication management, as well as her individual therapist to identify not only sources of anxiety, but how to appropriately address them out in the real world. Ideally, her therapist would also be helpful in discussing new stressors which could arise from a new employment/volunteer position and how to address them directly.  She could discuss the resumption of ADHD medication with her psychiatrist. However, based on my interaction with her, I do agree that getting a better handle on notably elevated anxiety would appear to take priority at the present time.   Performance across neurocognitive testing is not a strong predictor of an individual's safety operating a motor vehicle. Should she or her family wish to pursue a formalized driving evaluation, they could reach out to the following agencies: [employer] in [place]: [phone] [employer] [facility]: [phone] [facility]: [phone] [facility]: [phone] or [phone]  Maria Day is encouraged to attend to lifestyle factors for brain health (e.g., regular physical exercise, good nutrition habits and consideration of the MIND-DASH diet, regular participation in cognitively-stimulating activities, and general stress management techniques), which are likely to have benefits for both emotional adjustment and cognition. In fact, in addition to promoting good general health, regular exercise incorporating aerobic activities (e.g., brisk walking, jogging, cycling, etc.) has been demonstrated to be a very effective treatment for depression and stress, with  similar efficacy rates to both antidepressant medication and psychotherapy.  Optimal control of vascular risk factors (including safe cardiovascular exercise and adherence to dietary recommendations) is encouraged. Likewise, compliance with her CPAP machine or other sleep apnea treatment will also be important. Continued participation in activities which provide mental stimulation and social interaction is also recommended.   Memory can be improved using internal strategies such as rehearsal, repetition, chunking, mnemonics, association, and imagery. External strategies such as written notes in a consistently used memory journal, visual and nonverbal auditory cues such as a calendar on the refrigerator or appointments with alarm, such as on a cell phone, can also help maximize recall.    When learning new information, she would benefit from information being broken up into small, manageable pieces. she may also find it helpful to articulate the material in her own words and in a context to promote encoding at the onset of a new task. This material may need to be repeated multiple times to promote encoding.  Because she shows better recall for structured information, she will likely understand and retain new information better if it is presented to her in a meaningful or well-organized manner at the outset, such as grouping items into meaningful categories or presenting information in an outlined, bulleted, or story format.  To address problems with processing speed, she may wish to consider:   -Ensuring that she is alerted when essential material or instructions are being presented   -Adjusting the speed at which new information is presented   -Allowing for more time in comprehending, processing, and responding in conversation   -Repeating and paraphrasing instructions or conversations aloud  To address problems with fluctuating attention and/or executive dysfunction, she may wish to consider:    -Avoiding external distractions when needing to concentrate   -Limiting exposure to fast paced environments with multiple sensory demands   -Writing down complicated information and using checklists   -Attempting and completing one task at a time (i.e., no multi-tasking)   -Verbalizing aloud each step of a task to maintain focus   -Taking frequent breaks during the  completion of steps/tasks to avoid fatigue   -Reducing the amount of information considered at one time   -Scheduling more difficult activities for a time of day where she is usually most alert  It could be beneficial to have her paraphrase back information rather than simply repeat to allow those working with her to ensure she understands what is being asked of her and/or told to her.  Review of Records:   Ms. Whoolery was seen by [facility] Patrcia Dolly, M.D.) on 04/04/2022 to re-establish care. She had been seen by Dr. [name] in 2018 but was seen at [facility] in 2019-2022 for further epilepsy management. She was started on Lamotrigine in 2019. She was seizure-free from 2018 until February 2022 when she called about a blackout episode with no body jerking, followed by more grand mal seizures and staring episodes. Lamotrigine was increased to 100-200mg  in June 2022 and increased to 1000mg  BID in July 2022. She saw epileptologist Dr. Teresa Coombs in October 2022 where she was reporting mild body tremors and an inability to respond to verbal stimuli. Routine and Ambulatory EEG were normal in December 2022. She was admitted to the EMU from February 20-24, 2023 where baseline EEG showed generalized spikes and polyspikes with right frontal predominance, predominantly seen during sleep. Seizure medications were discontinued and 2 events were captured. The first seizure was on 2/21. She was initially off camera for the first minute, then was seen spinning in counterclockwise direction, with her left arm elevated with subtle jerking prior  to its fall. Her left arm and leg were extended, her right arm flexed and adductor, and her leg flexed and abducted followed by figure-of-four posturing with left arm extension and right arm flexion. Onset showed generalized and maximal right frontal spike followed by 6-7 Hz theta activity which appeared generalized and maximal in right hemisphere. The second seizure was on 2/23. After about 15 seconds of EEG seizure, she had her left upper extremity flexed, elevated, and abducted with turning to the left and forced left gaze deviation. She was holding something in the right arm which was flexed and adducted, then had figure-of-four posturing with left upper extremity extension and right upper extremity flexion, followed by convulsion. EEG onset showed similar changes to prior seizure. EEG pattern was felt to be due to generalized epilepsy; however focal epilepsy from the right frontal region could also have similar appearance. She was discharged home on Levetiracetam 1500mg  BID, Lamotrigine 200mg  BID, and Clobazam 10mg  daily was added to her regimen. On her follow-up with Dr. Teresa Coombs in June 2023, she continued to report out of body phenomenon, feeling like she would have a seizure, as well as lethargy/fatigue on Clobazam. Symptoms were felt due to anxiety and clonazepam 0.5mg  BID was started. She did well on the clonazepam but when she ran out, she was started on Hydroxyzine, which made her feel weird. She was prescribed Lacosamide but did not start it. She felt that being off clonazepam led to increase in seizure-like activity. She was advised to follow-up with Psychiatry. She stated that clonazepam helps calm her anxiety and that when she dose not take it, her anxiety is almost uncontrollable. She has been scared to go outside the house, which has been new since February.    Her mother reported that the episodes where she would get nauseated, crunch down, and shake (not violently) had stopped since February.  However, convulsive seizures have recurred where she is foaming at the mouth and "breathing bad." She  stated her left hand would stiffen up, followed by her waking on the floor. She stated this started after her EMU admission. Her last seizure was said to be two weeks prior to this appointment with Dr. [name]. She was reportedly getting something out of the dryer, her left hand stiffened up, and she woke up on the floor. Her grandmother reported she was foaming at the mouth, her eyes rolled back, and her seizure lasted approximately five minutes. She feels nauseated and weak after without focal weakness. There are typically post-ictal headaches.  Ultimately, Ms. Niquette was referred for a comprehensive neuropsychological evaluation to characterize her cognitive abilities and to assist with future treatment planning.  Brain MRI in 2018 was normal. Brain MRI on 05/14/2022 was unremarkable. Previous EEG studies are mentioned above.    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   Current Outpatient Medications:    busPIRone (BUSPAR) 5 MG tablet, Take 1 tablet (5 mg total) by mouth 3 (three) times daily., Disp: 90 tablet, Rfl: 0   clonazePAM (KLONOPIN) 0.5 MG tablet, Take 1 tablet (0.5 mg total) by mouth 2 (two) times daily., Disp: 60 tablet, Rfl: 5   lamoTRIgine (LAMICTAL) 200 MG tablet, Take 1 tablet (200 mg total) by mouth 2 (two) times daily., Disp: 180  tablet, Rfl: 3   levETIRAcetam (KEPPRA) 1000 MG tablet, Take 1 tablet twice a day (Take with 500 mg tablet for a total of 1500 mg twice a day), Disp: 180 tablet, Rfl: 3   levETIRAcetam (KEPPRA) 500 MG tablet, Take 1 tablet twice a day (Take with 1000mg  tablet for a total of 1500mg  twice a day)., Disp: 180 tablet, Rfl: 3   ziprasidone (GEODON) 40 MG capsule, Take 1 capsule (40 mg total) by mouth 2 (two) times daily with a meal., Disp: 60 capsule, Rfl: 0  Clinical Interview:   The following information was obtained during a clinical interview with Ms. Ehmann and her mother prior to cognitive testing.  Cognitive Symptoms: Decreased short-term memory: Denied. Decreased long-term memory: Denied. Decreased attention/concentration: Endorsed. Her mother reported that Ms. Guster was diagnosed with ADHD, likely the combined type, during elementary school. She had utilized various stimulant and non-stimulant treatments for this over the years while in school settings with some benefit. Medications were generally not utilized during the summer non-school months. Ms. Hickson noted that she is not currently taking any of these medications. She did describe ongoing distractibility and trouble with sustained focus/attention. Reduced processing speed: Denied. Rather, she described having an extremely overactive mind which is "too quick" in nature rather than reduced or diminished.  Difficulties with executive functions: Endorsed. She reported a history of trouble with organization and multi-tasking, likely relating back to her longstanding history of ADHD. Her mother expressed some impulsivity concerns, particularly surrounding financial spending habits. No severe personality changes were noted.  Difficulties with emotion regulation: Denied. Difficulties with receptive language: Denied. Difficulties with word finding: Denied. Decreased visuoperceptual ability: Denied.  Trajectory of deficits: Difficulties were  said to be lifelong  in nature and have been exacerbated by seizure events and severe anxiety over the years.   Difficulties completing ADLs: Denied. She has not driven lately due to seizure precautions and state laws requiring her to be 6 months seizure free.   Additional Medical History: History of traumatic brain injury/concussion: Denied. They did describe one prior instance where she fell and may have hit her head during a seizure event. They were unsure if she was formally diagnosed with a concussion and they did not report any persisting post-concussion symptoms.  History of stroke: Denied. History of seizure activity: Endorsed (see above). They reported that Ms. Traverse has been seizure free for the past six months at the time of the current evaluation.  History of known exposure to toxins: Denied. Symptoms of chronic pain: Denied. Experience of frequent headaches/migraines: Endorsed. She can experience post-ictal headaches. She also noted that headache symptoms can arise with increasing anxiety to the extent that they become quite severe.  Frequent instances of dizziness/vertigo: Denied.  Sensory changes: Largely denied. She reported some vague hearing difficulties that occur sporadically but was unable to describe them in great detail. Other sensory changes/difficulties (e.g., vision, taste, smell) were denied.  Balance/coordination difficulties: Denied. Other motor difficulties: Denied.  Sleep History: Estimated hours obtained each night: 8 hours.  Difficulties falling asleep: Endorsed. As stated above, she described having an extremely overactive mind which very commonly impacts her ability to fall asleep quickly.  Difficulties staying asleep: Denied. Feels rested and refreshed upon awakening: Variably so.  History of snoring: Denied. History of waking up gasping for air: Denied. Witnessed breath cessation while asleep: Denied. However, medical records do suggest a prior diagnosis  of obstructive sleep apnea.   History of vivid dreaming: Denied. Excessive movement while asleep: Denied. Instances of acting out her dreams: Denied.  Psychiatric/Behavioral Health History: Depression: Endorsed. She acknowledged a history of major depressive disorder, as well as acute symptoms of depression. These appeared to be primarily driven by the severity of anxiety symptoms. She denied current or remote suicidal ideation, intent, or plan.  Anxiety: Endorsed. She described very severe anxiety which represents a significant barrier for her and how she interacts with the world. Her mother noted that anxiety seems to have dramatically increased during the past year following some seizure activity. Since that time, Ms. Drill has reportedly exhibited some agoraphobic tendencies. She meets with her psychiatrist for medication management and works with an individual therapist discussing "life skills" and ways to deal with anxious distress. She described the latter as being mildly helpful.  Mania: Denied. Trauma History: Denied. Visual/auditory hallucinations: Denied. Delusional thoughts: Denied.  Tobacco: Denied. Alcohol: She denied current alcohol consumption as well as a history of problematic alcohol abuse or dependence.  Recreational drugs: Denied. Medical records do suggest at least some prior marijuana use.   Family History: Problem Relation Age of Onset   Depression Mother    Cancer Father        Leukemia   Schizophrenia Sister    Drug abuse Brother    Suicidality Maternal Grandmother    Hyperlipidemia Maternal Grandfather    Diabetes Paternal Grandfather    This information was confirmed by Ms. Grover.  Academic/Vocational History: Highest level of educational attainment: 12 years. Prior testing at an unspecified time suggested a full-scale IQ of 46 (Dr. Sharene Skeans). Ms. Foxx completed high school through a modified vocational program. She noted that in "regular" courses, she  struggled and commonly earned D/F marks. She was able to perform  well, earning A/B marks, in her modified program.  History of developmental delay: As stated above, prior IQ testing scored in the borderline range. Developmentally, Ms. Worthey experienced a febrile seizure at age 62. Previous records suggest that her mother described a "knot in the umbilical cord" and meconium in amniotic fluid where Ms. Elderkin needed antibiotics for a few days after birth. Outside of this, she had a normal early development.    History of grade repetition: Denied. Enrollment in special education courses: Endorsed. History of LD/ADHD: Endorsed.  Employment: Unemployed/Disability. She is hopeful that current testing will help with vocational rehabilitation placement so that she can maintain meaningful employment or at least a sufficient volunteer position.   Evaluation Results:   Behavioral Observations: Ms. Shimizu was accompanied by her mother, arrived to her appointment on time, and was appropriately dressed and groomed. She appeared alert and oriented. Observed gait and station were within normal limits. Gross motor functioning appeared intact upon informal observation and no abnormal movements (e.g., tremors) were noted. She wore sunglasses throughout the interview (as well as all testing procedures). Her affect was generally relaxed and positive, but did range appropriately given the subject being discussed during the clinical interview. Spontaneous speech was fluent and word finding difficulties were not observed during the clinical interview. Thought processes were coherent, organized, and normal in content. Insight into her cognitive difficulties appeared adequate.   During testing, sustained attention was appropriate. Task engagement was adequate and she persisted when challenged. Overall, Ms. Dudas was cooperative with the clinical interview and subsequent testing procedures.   Adequacy of Effort: The validity  of neuropsychological testing is limited by the extent to which the individual being tested may be assumed to have exerted adequate effort during testing. Ms. Lantrip expressed her intention to perform to the best of her abilities and exhibited adequate task engagement and persistence. Scores across stand-alone and embedded performance validity measures were within expectation. As such, the results stemming from the neurocognitive portion of the current evaluation are believed to be a valid representation of Ms. Erisman's current cognitive functioning. Across a broadband personality measure, Ms. Artim elevated symptom validity indicators subsequently invalidating this questionnaire. As such, responses surrounding psychiatric distress and personality are unable to be interpreted at face value.   Test Results: Across formal intelligence testing (WAIS-IV), Ms. Zapien full-scale IQ was 103 (1st percentile). There was not a large degree of difference between her FSIQ, her general abilities index (GAI; 67, 1st percentile), or performance across her verbal comprehension index (VCI; 70, 2nd percentile). Her IQ ranges align with the upper limits of a mild intellectual disability classification.    Processing speed was variable, ranging from the exceptionally low to below average normative ranges. Basic attention was well below average to below average. More complex attention (e.g., working memory) was exceptionally low to well below average. Executive functioning was variable, ranging from the exceptionally low to average normative ranges.  While not directly assessed, receptive language abilities were believed to be intact. Ms. Sprang did not exhibit any difficulties comprehending task instructions and answered all questions asked of her appropriately. Assessed expressive language (e.g., verbal fluency and confrontation naming) was exceptionally low to well below average.     Assessed  visuospatial/visuoconstructional abilities were variable, ranging from the exceptionally low to below average normative ranges.    Learning (i.e., encoding) of novel verbal information was exceptionally low to well below average. Spontaneous delayed recall (i.e., retrieval) of previously learned information was variable, ranging from the exceptionally  low to average normative ranges. Retention rates were 100% across a story learning task, 67% across a list learning task, and 39% across a figure drawing task. Performance across recognition tasks was variable but largely appropriate, suggesting evidence for information consolidation.  Basic motor speed was well below average bilaterally. Fine motor coordination and speed was exceptionally low bilaterally.    Results of emotional screening instruments suggested that recent symptoms of generalized anxiety were in the severe range, while symptoms of depression were within the moderate range. Responses across a lengthy personality questionnaire were invalidated as Ms. Masini responded in an inconsistent manner while also endorsing items that are infrequently reported even in purely clinical populations. A screening instrument assessing recent sleep quality suggested the presence of moderate sleep dysfunction.  Tables of Scores:   Note: This summary of test scores accompanies the interpretive report and should not be considered in isolation without reference to the appropriate sections in the text. Descriptors are based on appropriate normative data and may be adjusted based on clinical judgment. Terms such as "Within Normal Limits" and "Outside Normal Limits" are used when a more specific description of the test score cannot be determined.       Percentile - Normative Descriptor > 98 - Exceptionally High 91-97 - Well Above Average 75-90 - Above Average 25-74 - Average 9-24 - Below Average 2-8 - Well Below Average < 2 - Exceptionally Low        Validity:   DESCRIPTOR       TOMM: --- --- Within Normal Limits    Trial 1 --- --- ---    Trial 2 --- --- Within Normal Limits    Retention --- --- Within Normal Limits  ACS Word Choice: --- --- Within Normal Limits  RBANS Effort Index: --- --- Within Normal Limits       Cognitive Screening:     RBANS, Form A: Standard Score/ Scaled Score Percentile   Total Score 52 <1 Exceptionally Low  Immediate Memory 65 1 Exceptionally Low    List Learning 3 1 Exceptionally Low    Story Memory 5 5 Well Below Average  Visuospatial/Constructional 72 3 Well Below Average    Figure Copy 6 9 Below Average    Line Orientation 10/20 <2 Exceptionally Low  Language 57 <1 Exceptionally Low    Picture Naming 8/10 3-9 Well Below Average    Semantic Fluency 2 <1 Exceptionally Low  Attention 53 <1 Exceptionally Low    Digit Span 5 5 Well Below Average    Coding 1 <1 Exceptionally Low  Delayed Memory 52 <1 Exceptionally Low    List Recall 4/10 3-9 Well Below Average    List Recognition 17/20 <2 Exceptionally Low    Story Recall 8 25 Average    Story Recognition 11/12 47-68 Average    Figure Recall 2 <1 Exceptionally Low    Figure Recognition 8/8 70+ Average        Intellectual Functioning:     Wechsler Adult Intelligence Scale (WAIS-IV):  Standard Score/ Scaled Score Percentile   Full Scale IQ  66 1 Exceptionally Low  GAI 67 1 Exceptionally Low  Verbal Comprehension Index: 70 2 Well Below Average    Similarities  4 2 Well Below Average    Vocabulary 5 5 Well Below Average    Information  5 5 Well Below Average  Perceptual Reasoning Index:  69 2 Well Below Average    Block Design  5 5 Well Below Average  Matrix Reasoning  4 2 Well Below Average    Visual Puzzles 5 5 Well Below Average  Working Memory Index: 69 2 Well Below Average    Digit Span 4 2 Well Below Average    Arithmetic  5 5 Well Below Average  Processing Speed Index: 79 8 Well Below Average    Symbol Search  7 16 Below Average     Coding 5 5 Well Below Average       Attention/Executive Function:     Trail Making Test (TMT): Raw Score (T Score) Percentile     Part A 43 secs.,  0 errors (31) 3 Well Below Average    Part B 104 secs.,  0 errors (34) 5 Well Below Average         Scaled Score Percentile   WAIS-IV Digit Span: 4 2 Well Below Average    Forward 7 16 Below Average    Backward 4 2 Well Below Average    Sequencing 3 1 Exceptionally Low       D-KEFS Verbal Fluency Test: Raw Score (Scaled Score) Percentile     Letter Total Correct 12 (3) 1 Exceptionally Low    Category Total Correct 19 (2) <1 Exceptionally Low    Category Switching Total Correct 8 (3) 1 Exceptionally Low    Category Switching Accuracy 7 (5) 5 Well Below Average      Total Set Loss Errors 0 (13) 84 Above Average      Total Repetition Errors 0 (12) 75 Above Average       D-KEFS Design Fluency Test: Raw Score (Scaled Score) Percentile     Condition 1 - Filled Dots 10 (10) 50 Average    Condition 2 - Empty Dots 8 (8) 25 Average    Condition 3 - Switching 6 (8) 25 Average      Total Set Loss Errors 1 (13) 84 Above Average      Total Repetition Errors 6 (10) 50 Average       Language:     Verbal Fluency Test: Raw Score (T Score) Percentile     Phonemic Fluency (FAS) 12 (16) <1 Exceptionally Low    Animal Fluency 11 (20) <1 Exceptionally Low        NAB Language Module, Form 1: T Score Percentile     Naming 27/31 (34) 5 Well Below Average       Sensory-Motor:     [place] Grooved Pegboard Test: Raw Score Percentile     Dominant Hand 118 secs.,  0 drops  <1 Exceptionally Low    Non-Dominant Hand 181 secs.,  1 drop <1 Exceptionally Low       Finger Tapping Test: Mean Percentile     Dominant Hand 35 3 Well Below Average    Non-Dominant Hand 32 5 Well Below Average       Mood and Personality:      Raw Score Percentile   Beck Depression Inventory - II: 26 --- Moderate  PROMIS Anxiety Questionnaire: 30 --- Severe        Personality Assessment Inventory: T Score  Percentile     Inconsistency 67 --- Moderate    Infrequency 86 --- Invalid    Negative Impression 62 --- Within Normal Limits    Positive Impression 59 --- Moderate    Somatic Complaints 62 --- Within Normal Limits    Anxiety 62 --- Within Normal Limits    Anxiety-Related Disorders 59 --- Within Normal Limits    Depression 74 ---  Elevated    Mania 49 --- Within Normal Limits    Paranoia 68 --- Within Normal Limits    Schizophrenia 72 --- Elevated    Borderline Features 63 --- Within Normal Limits    Antisocial Features 63 --- Within Normal Limits    Alcohol Problems 66 --- Within Normal Limits    Drug Problems 70 --- Elevated    Aggression 61 --- Within Normal Limits    Suicidal Ideation 56 --- Within Normal Limits    Stress 44 --- Within Normal Limits    Non Support 58 --- Within Normal Limits    Treatment Rejection 51 --- Within Normal Limits    Dominance 42 --- Within Normal Limits    Warmth 28 --- Within Normal Limits       Additional Questionnaires:      Raw Score Percentile   PROMIS Sleep Disturbance Questionnaire: 30 --- Moderate   Informed Consent and Coding/Compliance:   The current evaluation represents a clinical evaluation for the purposes previously outlined by the referral source and is in no way reflective of a forensic evaluation.   Ms. Kaman was provided with a verbal description of the nature and purpose of the present neuropsychological evaluation. Also reviewed were the foreseeable risks and/or discomforts and benefits of the procedure, limits of confidentiality, and mandatory reporting requirements of this provider. The patient was given the opportunity to ask questions and receive answers about the evaluation. Oral consent to participate was provided by the patient.   This evaluation was conducted by Newman Nickels, Ph.D., ABPP-CN, board certified clinical neuropsychologist. Ms. Hunte completed a clinical  interview with Dr. Milbert Coulter, billed as one unit [phone], and 160 minutes of cognitive testing and scoring, billed as one unit [phone] and four additional units [id number]. Psychometrist Wallace Keller, B.S., assisted Dr. Milbert Coulter with test administration and scoring procedures. As a separate and discrete service, one unit [id number] and two units [phone] were billed for Dr. Tammy Sours time spent in interpretation and report writing.

## 2022-11-05 NOTE — Progress Notes (Signed)
   Psychometrician Note   Cognitive testing was administered to [facility] by Wallace Keller, B.S. (psychometrist) under the supervision of Dr. Newman Nickels, Ph.D., licensed psychologist on 11/05/2022. Ms. Solomonson did not appear overtly distressed by the testing session per behavioral observation or responses across self-report questionnaires. Rest breaks were offered.    The battery of tests administered was selected by Dr. Newman Nickels, Ph.D. with consideration to Ms. Pun's current level of functioning, the nature of her symptoms, emotional and behavioral responses during interview, level of literacy, observed level of motivation/effort, and the nature of the referral question. This battery was communicated to the psychometrist. Communication between Dr. Newman Nickels, Ph.D. and the psychometrist was ongoing throughout the evaluation and Dr. Newman Nickels, Ph.D. was immediately accessible at all times. Dr. Newman Nickels, Ph.D. provided supervision to the psychometrist on the date of this service to the extent necessary to assure the quality of all services provided.    Deshone Sherba will return within approximately 1-2 weeks for an interactive feedback session with Dr. Milbert Coulter at which time her test performances, clinical impressions, and treatment recommendations will be reviewed in detail. Ms. Thune understands she can contact our office should she require our assistance before this time.  A total of 160 minutes of billable time were spent face-to-face with Ms. Weltman by the psychometrist. This includes both test administration and scoring time. Billing for these services is reflected in the clinical report generated by Dr. Newman Nickels, Ph.D.  This note reflects time spent with the psychometrician and does not include test scores or any clinical interpretations made by Dr. Milbert Coulter. The full report will follow in a separate note.

## 2022-11-06 ENCOUNTER — Other Ambulatory Visit (HOSPITAL_COMMUNITY): Payer: Self-pay | Admitting: Psychiatry

## 2022-11-06 ENCOUNTER — Encounter: Payer: Self-pay | Admitting: Psychology

## 2022-11-06 DIAGNOSIS — F902 Attention-deficit hyperactivity disorder, combined type: Secondary | ICD-10-CM

## 2022-11-06 DIAGNOSIS — F0630 Mood disorder due to known physiological condition, unspecified: Secondary | ICD-10-CM

## 2022-11-06 DIAGNOSIS — F819 Developmental disorder of scholastic skills, unspecified: Secondary | ICD-10-CM

## 2022-11-07 ENCOUNTER — Other Ambulatory Visit (HOSPITAL_COMMUNITY): Payer: Self-pay | Admitting: *Deleted

## 2022-11-07 DIAGNOSIS — F0630 Mood disorder due to known physiological condition, unspecified: Secondary | ICD-10-CM

## 2022-11-07 DIAGNOSIS — F902 Attention-deficit hyperactivity disorder, combined type: Secondary | ICD-10-CM

## 2022-11-07 DIAGNOSIS — F819 Developmental disorder of scholastic skills, unspecified: Secondary | ICD-10-CM

## 2022-11-07 MED ORDER — ZIPRASIDONE HCL 40 MG PO CAPS: 40.0000 mg | ORAL_CAPSULE | Freq: Two times a day (BID) | ORAL | 1 refills | Status: DC

## 2022-11-08 ENCOUNTER — Other Ambulatory Visit (HOSPITAL_COMMUNITY): Payer: Self-pay | Admitting: *Deleted

## 2022-11-08 ENCOUNTER — Telehealth (HOSPITAL_COMMUNITY): Payer: Medicare Other | Admitting: Psychiatry

## 2022-11-08 DIAGNOSIS — F819 Developmental disorder of scholastic skills, unspecified: Secondary | ICD-10-CM

## 2022-11-08 DIAGNOSIS — F0630 Mood disorder due to known physiological condition, unspecified: Secondary | ICD-10-CM

## 2022-11-08 DIAGNOSIS — F411 Generalized anxiety disorder: Secondary | ICD-10-CM

## 2022-11-08 DIAGNOSIS — F902 Attention-deficit hyperactivity disorder, combined type: Secondary | ICD-10-CM

## 2022-11-08 MED ORDER — BUSPIRONE HCL 5 MG PO TABS: 5.0000 mg | ORAL_TABLET | Freq: Three times a day (TID) | ORAL | 0 refills | Status: DC

## 2022-11-09 ENCOUNTER — Encounter: Payer: Self-pay | Admitting: Neurology

## 2022-11-09 ENCOUNTER — Telehealth (HOSPITAL_COMMUNITY): Payer: Medicare Other | Admitting: Psychiatry

## 2022-11-19 ENCOUNTER — Ambulatory Visit (INDEPENDENT_AMBULATORY_CARE_PROVIDER_SITE_OTHER): Payer: Medicare Other | Admitting: Psychology

## 2022-11-19 ENCOUNTER — Telehealth (HOSPITAL_COMMUNITY): Payer: Self-pay | Admitting: *Deleted

## 2022-11-19 DIAGNOSIS — R41840 Attention and concentration deficit: Secondary | ICD-10-CM | POA: Diagnosis not present

## 2022-11-19 DIAGNOSIS — F331 Major depressive disorder, recurrent, moderate: Secondary | ICD-10-CM | POA: Diagnosis not present

## 2022-11-19 DIAGNOSIS — F411 Generalized anxiety disorder: Secondary | ICD-10-CM | POA: Diagnosis not present

## 2022-11-19 DIAGNOSIS — G40309 Generalized idiopathic epilepsy and epileptic syndromes, not intractable, without status epilepticus: Secondary | ICD-10-CM

## 2022-11-19 DIAGNOSIS — F70 Mild intellectual disabilities: Secondary | ICD-10-CM

## 2022-11-19 NOTE — Progress Notes (Signed)
   Neuropsychology Feedback Session Maria Day. [facility] [place] Department of Neurology  Reason for Referral:   Kimyra Pressnall is a 27 y.o. left-handed Caucasian female referred by Patrcia Dolly, M.D., to characterize her current cognitive functioning and assist with diagnostic clarity and treatment planning in the context of subjective cognitive dysfunction with a history history of ongoing seizure activity, ADHD, psychiatric comorbidities, and prior concerns surrounding borderline intellectual abilities.   Feedback:   Ms. Greger completed a comprehensive neuropsychological evaluation on 11/05/2022. Please refer to that encounter for the full report and recommendations. Briefly, across formal intelligence testing (WAIS-IV), Ms. Jaques full-scale IQ [facility]) was 66 (1st percentile). There was not a large degree of difference between her FSIQ, her general abilities index (GAI; 67, 1st percentile), or performance across her verbal comprehension index (VCI; 70, 2nd percentile). Her IQ ranges align with the upper limits of a mild intellectual disability classification. Across neurocognitive testing, normative performances suggest diffuse impairment. However, when interpreted relative to estimated premorbid intellectual estimations (i.e., IQ testing), performances are generally in line with expectation. More consistent weaknesses were exhibited across complex attention and verbal fluency (i.e., more rapid generation of speech and word finding). Variability (but weakness overall) was further suggested across processing speed, executive functioning (i.e., multi-tasking, problem solving), and visuospatial processing. Specific to memory, Ms. Sayarath demonstrated primary difficulties encoding (i.e., learning) novel information, likely impacted by deficits in processing speed and attention/concentration. Delayed memory performances were improved relative to initial learning efforts, suggesting that she is  able to retain learned information adequately.  Ms. Casaletto was accompanied by her mother and aunt during the current feedback session. Content of the current session focused on the results of her neuropsychological evaluation. Ms. Biegler was given the opportunity to ask questions and her questions were answered. She was encouraged to reach out should additional questions arise. A copy of her report was provided at the conclusion of the visit.      One unit [phone] was billed for Dr. Tammy Sours time spent preparing for, conducting, and documenting the current feedback session with Ms. Versteeg.

## 2022-11-19 NOTE — Telephone Encounter (Signed)
She need high level of care. Refer to PHP.

## 2022-11-19 NOTE — Telephone Encounter (Signed)
Writer spoke with pt who is c/o increased anxiety to the point that it's "impacting my everyday life". Pt states she has extreme anxiety when she goes anywhere, sometimes afraid she'll have a seizure, but when at home gets anxious and says she's been crying a]lot due to the anxiety. Pt says her mother is very concerned about pt. She has called for earlier appointment and is scheduled for 12/13/22. Writer briefly discussed IOP as well as [facility]. Pt would like to speak with [facility] regarding IOP. Please review and advise.

## 2022-11-21 ENCOUNTER — Telehealth (HOSPITAL_COMMUNITY): Payer: Self-pay

## 2022-11-21 ENCOUNTER — Telehealth (HOSPITAL_COMMUNITY): Payer: Self-pay | Admitting: Professional

## 2022-11-21 NOTE — Telephone Encounter (Signed)
Referral sent to [facility]

## 2022-11-21 NOTE — Telephone Encounter (Signed)
Patient called this morning about her increased anxiety, I told her that Dr. Lolly Mustache has referred her to [facility] and she should be getting a call. Patient is very anxious and just wants some relief.

## 2022-11-23 ENCOUNTER — Other Ambulatory Visit (HOSPITAL_COMMUNITY): Payer: Self-pay

## 2022-11-23 ENCOUNTER — Telehealth (HOSPITAL_COMMUNITY): Payer: Self-pay

## 2022-11-23 MED ORDER — OLANZAPINE 2.5 MG PO TABS: 2.5000 mg | ORAL_TABLET | Freq: Every day | ORAL | 0 refills | Status: DC

## 2022-11-23 NOTE — Telephone Encounter (Signed)
Patient is calling this morning about her anxiety. She was referred to [facility] and spoke with Whitney yesterday and will be starting next week. Patient states that she is not sure if current meds are working, she is crying more and very anxious. She reports that she cannot be by herself and even needs someone there when she sleeps. She did not know if there was any adjustment that could be made. Please review and advise, thank you

## 2022-11-23 NOTE — Telephone Encounter (Signed)
We can try olanzapine 2.5 mg at bedtime and if she agree please call the pharmacy and keep all the current medication.  Hopefully she can start PHP soon.  She does need a high level of care.

## 2022-11-27 ENCOUNTER — Other Ambulatory Visit (HOSPITAL_COMMUNITY): Payer: Medicare Other

## 2022-11-27 ENCOUNTER — Telehealth (HOSPITAL_COMMUNITY): Payer: Self-pay | Admitting: Professional

## 2022-11-28 ENCOUNTER — Other Ambulatory Visit (HOSPITAL_COMMUNITY): Payer: Medicare Other

## 2022-11-28 ENCOUNTER — Telehealth (HOSPITAL_COMMUNITY): Payer: Self-pay | Admitting: Licensed Clinical Social Worker

## 2022-11-29 ENCOUNTER — Other Ambulatory Visit (HOSPITAL_COMMUNITY): Payer: Self-pay | Admitting: Psychiatry

## 2022-11-29 ENCOUNTER — Telehealth (HOSPITAL_COMMUNITY): Payer: Self-pay | Admitting: Licensed Clinical Social Worker

## 2022-11-29 ENCOUNTER — Other Ambulatory Visit (HOSPITAL_COMMUNITY): Payer: Medicare Other | Attending: Psychiatry

## 2022-11-29 DIAGNOSIS — F0630 Mood disorder due to known physiological condition, unspecified: Secondary | ICD-10-CM

## 2022-11-29 DIAGNOSIS — F902 Attention-deficit hyperactivity disorder, combined type: Secondary | ICD-10-CM

## 2022-11-29 DIAGNOSIS — F819 Developmental disorder of scholastic skills, unspecified: Secondary | ICD-10-CM

## 2022-12-03 ENCOUNTER — Encounter: Payer: Self-pay | Admitting: Neurology

## 2022-12-05 ENCOUNTER — Other Ambulatory Visit (HOSPITAL_COMMUNITY): Payer: Self-pay | Admitting: Psychiatry

## 2022-12-05 DIAGNOSIS — F411 Generalized anxiety disorder: Secondary | ICD-10-CM

## 2022-12-05 DIAGNOSIS — F902 Attention-deficit hyperactivity disorder, combined type: Secondary | ICD-10-CM

## 2022-12-05 DIAGNOSIS — F0630 Mood disorder due to known physiological condition, unspecified: Secondary | ICD-10-CM

## 2022-12-05 DIAGNOSIS — F819 Developmental disorder of scholastic skills, unspecified: Secondary | ICD-10-CM

## 2022-12-10 ENCOUNTER — Other Ambulatory Visit (HOSPITAL_COMMUNITY): Payer: Self-pay

## 2022-12-10 DIAGNOSIS — F0630 Mood disorder due to known physiological condition, unspecified: Secondary | ICD-10-CM

## 2022-12-10 DIAGNOSIS — F819 Developmental disorder of scholastic skills, unspecified: Secondary | ICD-10-CM

## 2022-12-10 DIAGNOSIS — F902 Attention-deficit hyperactivity disorder, combined type: Secondary | ICD-10-CM

## 2022-12-10 DIAGNOSIS — F411 Generalized anxiety disorder: Secondary | ICD-10-CM

## 2022-12-10 MED ORDER — BUSPIRONE HCL 5 MG PO TABS: 5.0000 mg | ORAL_TABLET | Freq: Three times a day (TID) | ORAL | 0 refills | Status: DC

## 2022-12-13 ENCOUNTER — Encounter (HOSPITAL_COMMUNITY): Payer: Self-pay | Admitting: Psychiatry

## 2022-12-13 ENCOUNTER — Ambulatory Visit (HOSPITAL_COMMUNITY): Payer: Medicare Other | Admitting: Psychiatry

## 2022-12-13 VITALS — BP 108/75 | HR 87 | Resp 18 | Ht 59.0 in | Wt 176.4 lb

## 2022-12-13 DIAGNOSIS — F411 Generalized anxiety disorder: Secondary | ICD-10-CM

## 2022-12-13 DIAGNOSIS — F902 Attention-deficit hyperactivity disorder, combined type: Secondary | ICD-10-CM

## 2022-12-13 DIAGNOSIS — F0630 Mood disorder due to known physiological condition, unspecified: Secondary | ICD-10-CM

## 2022-12-13 DIAGNOSIS — F819 Developmental disorder of scholastic skills, unspecified: Secondary | ICD-10-CM

## 2022-12-13 DIAGNOSIS — F419 Anxiety disorder, unspecified: Secondary | ICD-10-CM

## 2022-12-13 MED ORDER — OLANZAPINE 5 MG PO TABS: 5.0000 mg | ORAL_TABLET | Freq: Every day | ORAL | 0 refills | Status: DC

## 2022-12-13 NOTE — Progress Notes (Signed)
BH MD/PA/NP OP Progress Note  12/13/2022 11:10 AM Maria Day  MRN:  [id number]  Chief Complaint:  Chief Complaint  Patient presents with   Follow-up   Anxiety   HPI: Patient came in today in the office with her mother.  She had called few times requesting something to help anxiety.  We started olanzapine 2.5 mg at bedtime.  She is also taking Geodon, BuSpar and Klonopin 0.5 mg twice a day.  She reported still struggle with anxiety but denies any mania, anger, irritability.  She recently had cognitive testing and found to have IQ less than 60.  Her mother is concerned because IQ further dropped as before it was 15.  Patient has a learning disability.  Mother is concerned because she was not given resources for vocational rehab.  Patient sleeping better with low-dose olanzapine but during the day she feels very nervous and anxious.  Especially when she in the car she had a lot of anxiety.  She does not reported any hallucination, suicidal thoughts or homicidal thoughts.  She is currently not seeing any therapist.  She is not sure if the BuSpar helping her anxiety.  She is trying to lose weight watching her calorie intake.  She is on the Atkins diet and lost few pounds since the last visit.  She has no tremors, shakes or any EPS.  She liked olanzapine.  Visit Diagnosis:    ICD-10-CM   1. GAD (generalized anxiety disorder)  F41.1 OLANZapine (ZYPREXA) 5 MG tablet    2. Mood disorder in conditions classified elsewhere  F06.30 OLANZapine (ZYPREXA) 5 MG tablet    3. Attention deficit hyperactivity disorder, combined type  F90.2 OLANZapine (ZYPREXA) 5 MG tablet    4. Learning disorder  F81.9 OLANZapine (ZYPREXA) 5 MG tablet    5. Anxiety  F41.9 OLANZapine (ZYPREXA) 5 MG tablet       Past Psychiatric History: H/O oppositional defiant, mood swings, LD and ADHD. Had psychological testing with IQ around 52.  H/O struggle with grades in school but with help of VR finished high school with good  grades. Took Concerta for ADD and Risperdal for mood symptoms.  No history of suicidal attempt, inpatient or abuse.   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  Family Psychiatric History: Reviewed.  Family History:  Family History  Problem Relation Age of Onset   Depression Mother    Cancer Father        Leukemia   Schizophrenia Sister    Drug abuse Brother    Suicidality Maternal Grandmother    Hyperlipidemia Maternal Grandfather    Diabetes Paternal Grandfather     Social History:  Social History   Socioeconomic History   Marital status: Single    Spouse name: Not on file   Number of children: Not on file   Years of education: 12   Highest education level: High school graduate  Occupational History   Occupation: Unemployed  Tobacco Use   Smoking status: Former  Types: Cigarettes   Smokeless tobacco: Never  Vaping Use   Vaping Use: Never used  Substance and Sexual Activity   Alcohol use: No   Drug use: Not Currently    Types: Marijuana   Sexual activity: Never  Other Topics Concern   Not on file  Social History Narrative   Left handed    Social Determinants of Health   Financial Resource Strain: Not on file  Food Insecurity: Not on file  Transportation Needs: Not on file  Physical Activity: Not on file  Stress: Not on file  Social Connections: Not on file    Allergies:  Allergies  Allergen  Reactions   Penicillins Rash   Cinnamon Swelling   Amoxicillin Rash    Metabolic Disorder Labs: No results found for: "HGBA1C", "MPG" No results found for: "PROLACTIN" No results found for: "CHOL", "TRIG", "HDL", "CHOLHDL", "VLDL", "LDLCALC" Lab Results  Component Value Date   TSH 3.595 08/21/2021    Therapeutic Level Labs: No results found for: "LITHIUM" No results found for: "VALPROATE" No results found for: "CBMZ"  Current Medications: Current Outpatient Medications  Medication Sig Dispense Refill   busPIRone (BUSPAR) 5 MG tablet Take 1 tablet (5 mg total) by mouth 3 (three) times daily. 90 tablet 0   clonazePAM (KLONOPIN) 0.5 MG tablet Take 1 tablet (0.5 mg total) by mouth 2 (two) times daily. 60 tablet 5   lamoTRIgine (LAMICTAL) 200 MG tablet Take 1 tablet (200 mg total) by mouth 2 (two) times daily. 180 tablet 3   levETIRAcetam (KEPPRA) 1000 MG tablet Take 1 tablet twice a day (Take with 500 mg tablet for a total of 1500 mg twice a day) 180 tablet 3   levETIRAcetam (KEPPRA) 500 MG tablet Take 1 tablet twice a day (Take with 1000mg  tablet for a total of 1500mg  twice a day). 180 tablet 3   OLANZapine (ZYPREXA) 2.5 MG tablet Take 1 tablet (2.5 mg total) by mouth at bedtime. 30 tablet 0   ziprasidone (GEODON) 40 MG capsule Take 1 capsule (40 mg total) by mouth 2 (two) times daily with a meal. 60 capsule 1   No current facility-administered medications for this visit.     Musculoskeletal: Strength & Muscle Tone: within normal limits Gait & Station: normal Patient leans: N/A  Psychiatric Specialty Exam: Review of Systems  Blood pressure 108/75, pulse 87, resp. rate 18, height 4\' 11"  (1.499 m), weight 176 lb 6.4 oz (80 kg), SpO2 98 %.Body mass index is 35.63 kg/m.  General Appearance: Fairly Groomed  Eye Contact:  Fair  Speech:  Slow  Volume:  Decreased  Mood:  Anxious and Dysphoric  Affect:  Labile  Thought Process:  Descriptions of Associations: Intact   Orientation:  Full (Time, Place, and Person)  Thought Content: Rumination   Suicidal Thoughts:  No  Homicidal Thoughts:  No  Memory:  Immediate;   Fair Recent;   Fair Remote;   Fair  Judgement:  Fair  Insight:  Shallow  Psychomotor Activity:  Decreased  Concentration:  Concentration: Fair and Attention Span: Fair  Recall:  [employer] of Knowledge: Fair  Language: Good  Akathisia:  No  Handed:  Right  AIMS (if indicated): not done  Assets:  Communication Skills Desire for Improvement Housing  ADL's:  Intact  Cognition: WNL  Sleep:   better   Screenings: GAD-7    Flowsheet Row [facility]/20[facility]  Total GAD-7 Score 19  PHQ2-9    Flowsheet Row Office Visit from 04/30/2022 in [facility]  PHQ-2 Total Score 2  PHQ-9 Total Score 5      Flowsheet Row ED from 06/18/2022 in [facility] Office Visit from 04[facility]30/2022 in [facility] Admission (Discharged) from 08/21/2021 in [facility]  C-SSRS RISK CATEGORY No Risk No Risk No Risk        Assessment and Plan: I reviewed cognitive testing results with the patient and her mother.  IQ further dropped and she has difficulty and weakness in verbal comprehension.  I explained to the patient and the mother that medicine has some efficacy but she may need a vocational rehab to help her coping skills and overcome with her functionality.  Patient like to prefer to a therapist because she did not like the previous therapist.  We will refer to Ms. Lucretia Field for counseling.  I do believe patient will need to vocational rehab and we will send a note to neurologist psychologist who did neuropsych testing.  Patient also have a lot of questions about the medication, side effects and her long-term prognosis.  Patient and her mother wondering  if multiple medication can cause side effects.  We did go over with her medication efficacy and side effects.  Patient does not feel the BuSpar working and I recommend to discontinue.  She feels the Klonopin helps but she is getting from neurology and not sure if she can get Klonopin in the future.  I explained the Klonopin has extra advantage to help the seizures.  I recommend should continue because it does help the anxiety and currently she is taking 25 mg 2 times a day and if needed she can take up to 3 times a day.  We will happy to continue Klonopin in the future if needed however we discussed benzodiazepine dependence tolerance withdrawal and not to take with any sedatives, alcohol or drugs.  Patient agreed and currently not taking any of these medication.  I also recommend to optimize olanzapine to 5 mg and discontinue Geodon as there is no need to take 2 antipsychotic medication.  Patient was given AVS about her current medication.  We will follow-up in 4 weeks in person.  Encouraged to call us back if she has any worsening of symptoms.  Collaboration of Care: Collaboration of Care: Other provider involved in patient's care AEB notes are available in epic to review.  Patient/Guardian was advised Release of Information must be obtained prior to any record release in order to collaborate their care with an outside provider. Patient/Guardian was advised if they have not already done so to contact the registration department to sign all necessary forms in order for [place] to release information regarding their care.   Consent: Patient/Guardian gives verbal consent for treatment and assignment of benefits for services provided during this visit. Patient/Guardian expressed understanding and agreed to proceed.    Cleotis Nipper, MD 12/13/2022, 11:10 AM

## 2022-12-19 DIAGNOSIS — N898 Other specified noninflammatory disorders of vagina: Secondary | ICD-10-CM | POA: Diagnosis not present

## 2022-12-19 DIAGNOSIS — Z113 Encounter for screening for infections with a predominantly sexual mode of transmission: Secondary | ICD-10-CM | POA: Diagnosis not present

## 2022-12-19 DIAGNOSIS — R350 Frequency of micturition: Secondary | ICD-10-CM | POA: Diagnosis not present

## 2022-12-20 ENCOUNTER — Ambulatory Visit (HOSPITAL_COMMUNITY): Payer: Medicare Other | Admitting: Psychiatry

## 2022-12-24 ENCOUNTER — Telehealth (HOSPITAL_COMMUNITY): Payer: Self-pay

## 2022-12-24 NOTE — Telephone Encounter (Signed)
Patient called with a question about her Clonazepam 0.5 mg. Patient is ordered to take 2 a day and was taking one in the morning and one before bed. Patient stated that her anxiety starts getting bad around 4-5 pm and wanted to know if she could take the Clonazepam at 2-3 pm instead of at bedtime. I reviewed patients medication and advised her that yes, she could take it in the late afternoon and not at bedtime. Patient will try this and see how it goes. She will call me back if this does not work.

## 2022-12-26 ENCOUNTER — Encounter: Payer: Self-pay | Admitting: Neurology

## 2022-12-26 ENCOUNTER — Ambulatory Visit (INDEPENDENT_AMBULATORY_CARE_PROVIDER_SITE_OTHER): Payer: Medicare Other | Admitting: Neurology

## 2022-12-26 VITALS — BP 131/88 | HR 88 | Resp 20 | Ht 60.0 in | Wt 177.0 lb

## 2022-12-26 DIAGNOSIS — G40309 Generalized idiopathic epilepsy and epileptic syndromes, not intractable, without status epilepticus: Secondary | ICD-10-CM

## 2022-12-26 DIAGNOSIS — F70 Mild intellectual disabilities: Secondary | ICD-10-CM | POA: Diagnosis not present

## 2022-12-26 MED ORDER — LAMOTRIGINE 200 MG PO TABS: 200.0000 mg | ORAL_TABLET | Freq: Two times a day (BID) | ORAL | 3 refills | Status: DC

## 2022-12-26 MED ORDER — LEVETIRACETAM 500 MG PO TABS: ORAL_TABLET | ORAL | 3 refills | Status: DC

## 2022-12-26 MED ORDER — LEVETIRACETAM 1000 MG PO TABS: ORAL_TABLET | ORAL | 3 refills | Status: DC

## 2022-12-26 NOTE — Progress Notes (Signed)
NEUROLOGY FOLLOW UP OFFICE NOTE  Maria Day [id number] 01-Dec-1995  HISTORY OF PRESENT ILLNESS: I had the pleasure of seeing Maria Day in follow-up in the neurology clinic on 12/26/2022.  The patient was last seen 3 months ago for seizures. She is again accompanied by her mother who helps supplement the history today.  Records and images were personally reviewed where available.  She had Neuropsychological testing on 11/05/2022 with a diagnosis of Mild Intellectual Disability. Since her last visit, they continue to deny any seizures since 03/2022 on Levetiracetam 1500mg  BID and Lamotrigine 200mg  BID, no side effects. She continues to report constant feelings "like I am out of my body." She feels like she will fall, she feels "dizzyheaded, all like a dream, like I'm not here." Symptoms usually start around 4pm then after she takes her evening medications it is not as bad. She repeatedly says she wants to "feel myself again." She states she is clumsy but has not had any falls.   She continues to closely follow-up with Psychiatry and takes Zyprexa. She has been instructed that she can take an additional dose of clonazepam for anxiety when she drives. She drives short distances but gets very anxious when she gets in the car. She continues to avoid going to places with bright lights, worried she would have a seizure. She has started volunteering once a week at [employer]. Her mother has noticed sometimes she does not seem to process what she says, "kind of spacey a little bit." She still cannot sleep by herself, she sleeps 8 hours with her mother.   Review of Neuropsychological evaluation recommendations with Dr. Milbert Coulter suggested working with Child psychotherapist for vocational rehabilitation, as well as occupational therapy for having a borderline/mild intellectual disability and a desire to live independently. Any therapies which could be helpful in increasing the potential success rate of this given her  comorbid medical conditions would be helpful. They have not heard back about these services yet.     History on Initial Assessment 04/04/2022: This is a 27 year old right-handed woman with a history of ADD, OSA, and seizures, presenting to establish care. I had previously seen her in 2018 however she then went to [facility] in 2019-2022 for further epilepsy management. She was started on Lamotrigine in 2019. She was seizure-free from 2018 to 08/2020 when she called about a blackout episode with no body jerking, then called to report more seizures with grand mal and staring episodes, Lamotrigine increased to 100-200mg  in 11/2020. Levetiracetam increased to 1000mg  BID in 12/2020. She then saw epileptologist Dr. Teresa Coombs in 04/2021. They were reporting mild body tremors, inability to respond to verbal stimuli. Routine and Ambulatory EEG were normal in 06/2021. She was admitted to the EMU from February 20-24, 2023 where baseline EEG showed generalized spikes and polyspikes with right frontal predominance, predominantly seen during sleep. Seizure medications were discontinued and 2 events were captured. The first seizure was on 2/21, she was initially off camera for the first minute, then she was seen spinning in counterclockwise direction, left arm elevated with subtle jerking, then fell. Left arm and leg were extended, right arm flexed and adductor, leg flexed and abducted followed by figure-of-four posturing with left arm extension and right arm flexion. Onset showed generalized and maximal right frontal spike followed by 6-7 Hz theta activity which appeared generalized and maximal in right hemisphere. The second seizure was on 2/23, after about 15 seconds of EEG seizure, she had left upper extremity  flexed, elevated, and abducted with patient turning to the left and forced left gaze deviation. She was holding something in the right arm which was flexed and adducted, then she had figure-of-four posturing with  left upper extremity extension and right upper extremity flexion, followed by convulsion. EEG onset showed similar changes to prior seizure. 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 She was discharged home on Levetiracetam 1500mg  BID, Lamotrigine 200mg  BID, and Clobazam 10mg  daily was added to her regimen. On her follow-up with Dr. Teresa Coombs in 11/2021, she continued to report out of body phenomenon and feeling like she would have a seizure, as well as lethargy/fatigue on Clobazam. Symptoms were felt due to anxiety as she is under a lot of stress, and clonazepam 0.5mg  BID was started. She did well on the clonazepam but when she ran out, she was started on Hydroxyzine, which made her feel weird. She was prescribed Lacosamide but did not start it. She felt being off clonazepam led to increase in seizure-like activity. She was advised to follow-up with Psychiatry but has not been able to. She saw her PCP in [place]. [name] who "agreed with me that Fluoxetine is not working," she patient self-discontinued it a month ago. She states the clonazepam really helps calm her anxiety, when she dose not take it, anxiety is almost uncontrollable. She states that she has "very, very, very bad anxiety," and has not felt like herself in a very long time. She feels like she is an alien in her body. She is scared to go out of the house, which is new since February.   Her mother reports that the episodes where she would get nauseated, crunch down and shake (not violently) had stopped since February, however her convulsive seizures have recurred where she is foaming at the mouth and "breathing bad." She states her left hand would stiffen up, then she wakes up on the floor. She states this is new and started after her EMU admission. Her aunt recalls a seizure at the beach last May, she was sitting on the couch then it looked like she was grabbing  something on her left side with left arm extended, turned in a circle, and fell out. She vomited after and had a horrible headache. Last seizure was 2 weeks ago, she was getting something out of the dryer then her left hand stiffened up and she woke up on the floor. Her grandmother reported she was foaming at the mouth, eyes rolled back, seizure lasted 5 minutes. She feels nauseated and weak after, no focal weakness. She typically has headaches after a seizure. Yesterday she had a "sick headache" with nausea, no vomiting and wears sunglasses in the office today.  She has a contentious relationship with her mother. She was previously living in her own apartment, alternating between her apartment and father's house. Her father was recently diagnosed with a medical condition and she moved back in with her mother. She feels her mother wants to control everything. Her mother and aunt expressed concern that about living alone and making decisions to live on her own, stating she has had troubles in her life processing issues, needing reminders to brush her teeth for instance. Prior records from Dr. Sharene Skeans indicated she had psychoeducational testing that revealed an IQ of 17. Genetic testing was normal. She lost 2 jobs and is on disability.  Epilepsy Risk Factors:  She had a febrile seizure at  age 69. Her mother reports there was a "knot in the umbilical cord" and meconium in amniotic fluid, she needed antibiotics for a few days. Otherwise she had a normal early development.  There is no history of CNS infections such as meningitis/encephalitis, significant traumatic brain injury, neurosurgical procedures, or family history of seizures.   Prior ASMs: Levetiracetam, Zonisamide, Topiramate, clobazam (lethargy and fatigue)  Diagnostic Data: Brain MRI with and without contrast in 2018 was normal. Brain MRI with and without contrast done 05/14/2022 was normal, hippocampi symmetric with no abnormal signal or  enhancement seen.  EEG in 2019 was abnormal due to the presence of occasional generalized irregular high voltage 4-5 Hz spike and polyspike and wave discharges, some with right-sided predominance.  Routine EEG in 06/2021 was normal.  Ambulatory EEG in 06/2021 was normal, one event of seeing flashes of light when closing eyes with no changes in the EEG background. A normal EEG does not exclude a diagnosis of epilepsy.  EMU admission 08/21/21 to 08/25/21 captured 2 events which were consistent with seizures, with generalized onset.  However, focal epilepsy from right frontal region could also have similar appearance.  Unfortunately, we were unable to capture the new events patient has had during which patient does not feel well, comes to her mother and then has full body shaking.  It is possible that patient has coexisting nonepileptic events.    Laboratory Data from 07/2022: Levetiracetam level 23.4 Lamotrigine level 5.3   PAST MEDICAL HISTORY: Past Medical History:  Diagnosis Date   Acanthosis nigricans 11/18/2014   ADHD (attention deficit hyperactivity disorder), combined type 12/08/2013   Generalized anxiety disorder, severe 11/05/2022   Major depressive disorder 11/05/2022   Migraine without aura and without status migrainosus, not intractable 05/31/2017   Mild intellectual disability 11/05/2022   Myoclonus    Nonintractable generalized idiopathic epilepsy without status epilepticus 12/17/2016   Obesity 11/18/2014   OSA (obstructive sleep apnea) 04/10/2018   Ureterolithiasis     MEDICATIONS: Current Outpatient Medications on File Prior to Visit  Medication Sig Dispense Refill   clonazePAM (KLONOPIN) 0.5 MG tablet Take 1 tablet (0.5 mg total) by mouth 2 (two) times daily. (Patient taking differently: Take 0.5 mg by mouth 3 (three) times daily as needed.) 60 tablet 5   lamoTRIgine (LAMICTAL) 200 MG tablet Take 1 tablet (200 mg total) by mouth 2 (two) times daily. 180 tablet 3    levETIRAcetam (KEPPRA) 1000 MG tablet Take 1 tablet twice a day (Take with 500 mg tablet for a total of 1500 mg twice a day) 180 tablet 3   levETIRAcetam (KEPPRA) 500 MG tablet Take 1 tablet twice a day (Take with 1000mg  tablet for a total of 1500mg  twice a day). 180 tablet 3   metroNIDAZOLE (FLAGYL) 500 MG tablet Take 500 mg by mouth 2 (two) times daily.     OLANZapine (ZYPREXA) 5 MG tablet Take 1 tablet (5 mg total) by mouth at bedtime. 30 tablet 0   No current facility-administered medications on file prior to visit.    ALLERGIES: Allergies  Allergen Reactions   Penicillins Rash   Cinnamon Swelling   Amoxicillin Rash    FAMILY HISTORY: Family History  Problem Relation Age of Onset   Depression Mother    Cancer Father        Leukemia   Schizophrenia Sister    Drug abuse Brother    Suicidality Maternal Grandmother    Hyperlipidemia Maternal Grandfather    Diabetes Paternal Grandfather  SOCIAL HISTORY: Social History   Socioeconomic History   Marital status: Single    Spouse name: Not on file   Number of children: Not on file   Years of education: 12   Highest education level: High school graduate  Occupational History   Occupation: Unemployed  Tobacco Use   Smoking status: Former    Types: Cigarettes   Smokeless tobacco: Never  Building services engineer Use: Never used  Substance and Sexual Activity   Alcohol use: No   Drug use: Not Currently    Types: Marijuana   Sexual activity: Never  Other Topics Concern   Not on file  Social History Narrative   Left handed    Social Determinants of Health   Financial Resource Strain: Not on file  Food Insecurity: Not on file  Transportation Needs: Not on file  Physical Activity: Not on file  Stress: Not on file  Social Connections: Not on file  Intimate Partner Violence: Not on file     PHYSICAL EXAM: Vitals:   12/26/22 0920  BP: 131/88  Pulse: 88  Resp: 20  SpO2: 98%   General: No acute distress Head:   Normocephalic/atraumatic Skin/Extremities: No rash, no edema Neurological Exam: alert and awake. No aphasia or dysarthria. Fund of knowledge is appropriate.  Attention and concentration are normal.   Cranial nerves: Pupils equal, round. Extraocular movements intact with no nystagmus. Visual fields full.  No facial asymmetry.  Motor: Bulk and tone normal, muscle strength 5/5 throughout with no pronator drift.   Finger to nose testing intact.  Gait narrow-based and steady, no ataxia.   IMPRESSION: This is a 27 yo RH woman with a history of ADD, OSA, anxiety, depression, and seizures. She has had seizures since childhood with generalized convulsions and myoclonic jerks. During her EMU admission in February 2023, she had 2 convulsive seizures with focal clinical features, EEG pattern could be seen with generalized epilepsy however focal epilepsy from the right frontal region could also have similar appearance. MRI brain normal. She has not had any seizures since 03/2022. She continues to report feelings like she is not in her body that are constant and worse in the afternoon hours suggestive of depersonalization, discussed the importance of starting to see a therapist, she has not been able to find a good fit. We again discussed her ambulatory EEG when she was having these sensations did not show any epileptiform correlate, and that they are not seizure-related. Continue Lamotrigine 200mg  BID and Levetiracetam 1500mg  BID. She is on clonazepam 0.5mg  BID, and was advised she can take an extra dose if needed (but has not done so). Continue follow-up with Dr. Lolly Mustache. I encouraged her that she has had improvement since her initial visit, she is less anxious and was encouraged to continue working with [facility]. She has been driving and is aware of [place] driving laws to stop driving after a seizure until 6 months seizure-free. Neuropsychological evaluation recommendations discussed, we will try to help find a social worker to  guide her with vocational rehab and refer to Occupational therapy for borderline/mild intellectual disability and a desire to live independently. Any therapies which could be helpful in increasing the potential success rate of this given her comorbid medical conditions would be helpful. Follow-up in 3-4 months, call for any changes.     Thank you for allowing me to participate in her care.  Please do not hesitate to call for any questions or concerns.    Patrcia Dolly,  M.D.   CC: Dr. Lolly Mustache

## 2022-12-26 NOTE — Patient Instructions (Addendum)
Good to see you.  Continue Lamotrigine 200mg  twice a day and Levetiracetam 1500mg  twice a day, clonazepam 0.5mg  twice a day  2. Referral will be sent to Occupational Therapy and I will send a follow-up email to our social worker regarding the Vocational rehab resources  3. Please contact Dr. Lolly Mustache about the difficulty of finding a therapist  4. Follow-up in 3-4 months, call for any changes   Seizure Precautions: 1. If medication has been prescribed for you to prevent seizures, take it exactly as directed.  Do not stop taking the medicine without talking to your doctor first, even if you have not had a seizure in a long time.   2. Avoid activities in which a seizure would cause danger to yourself or to others.  Don't operate dangerous machinery, swim alone, or climb in high or dangerous places, such as on ladders, roofs, or girders.  Do not drive unless your doctor says you may.  3. If you have any warning that you may have a seizure, lay down in a safe place where you can't hurt yourself.    4.  No driving for 6 months from last seizure, as per [facility].   Please refer to the following link on the Epilepsy Foundation of America's website for more information: http://www.epilepsyfoundation.org/answerplace/Social/driving/drivingu.cfm   5.  Maintain good sleep hygiene. Avoid alcohol.  6.  Notify your neurology if you are planning pregnancy or if you become pregnant.  7.  Contact your doctor if you have any problems that may be related to the medicine you are taking.  8.  Call 911 and bring the patient back to the ED if:        A.  The seizure lasts longer than 5 minutes.       B.  The patient doesn't awaken shortly after the seizure  C.  The patient has new problems such as difficulty seeing, speaking or moving  D.  The patient was injured during the seizure  E.  The patient has a temperature over 102 F (39C)  F.  The patient vomited and now is having trouble  breathing

## 2022-12-27 ENCOUNTER — Telehealth: Payer: Self-pay

## 2022-12-27 ENCOUNTER — Encounter: Payer: Self-pay | Admitting: Neurology

## 2022-12-27 DIAGNOSIS — G40309 Generalized idiopathic epilepsy and epileptic syndromes, not intractable, without status epilepticus: Secondary | ICD-10-CM

## 2022-12-27 NOTE — Patient Outreach (Signed)
Received a referral from Lynwood Dawley at [facility] [phone]  Patient has epilepsy and mild intellectual disability and needs help with care coordination and care management.    I have sent this referral to the [facility] Care Guides to assign a Nurse Care Coordinator.    Iverson Alamin, Donivan Scull [facility] Care Management Assistant [facility] Care Management [phone]

## 2022-12-28 ENCOUNTER — Other Ambulatory Visit: Payer: Self-pay

## 2022-12-28 DIAGNOSIS — F70 Mild intellectual disabilities: Secondary | ICD-10-CM

## 2022-12-31 ENCOUNTER — Other Ambulatory Visit (HOSPITAL_COMMUNITY): Payer: Self-pay | Admitting: Psychiatry

## 2022-12-31 ENCOUNTER — Telehealth: Payer: Self-pay | Admitting: *Deleted

## 2022-12-31 DIAGNOSIS — F0630 Mood disorder due to known physiological condition, unspecified: Secondary | ICD-10-CM

## 2022-12-31 DIAGNOSIS — F902 Attention-deficit hyperactivity disorder, combined type: Secondary | ICD-10-CM

## 2022-12-31 DIAGNOSIS — F819 Developmental disorder of scholastic skills, unspecified: Secondary | ICD-10-CM

## 2022-12-31 NOTE — Progress Notes (Signed)
    Outreach Note  12/31/2022 Name: Catheline Bjornsen MRN: [id number] DOB: Jan 29, 1996   Care Coordination Outreach Attempts: An unsuccessful telephone outreach was attempted today to schedule referral for patient.  Follow Up Plan:  Additional outreach attempts will be made.   Encounter Outcome:  No Answer  Gwenevere Ghazi  Care Coordination Care Guide  Direct Dial: [phone]

## 2023-01-01 NOTE — Progress Notes (Signed)
  Care Coordination   Note   01/01/2023 Name: Maria Day MRN: [id number] DOB: 1995/11/28  [name] is a 27 y.o. year old female who sees Pcp, No for primary care. I reached out to [name] by phone today to offer care coordination services.  [name] was given information about Care Coordination services today including:   The Care Coordination services include support from the care team which includes your Nurse Coordinator, Clinical Social Worker, or Pharmacist.  The Care Coordination team is here to help remove barriers to the health concerns and goals most important to you. Care Coordination services are voluntary, and the patient may decline or stop services at any time by request to their care team member.   Care Coordination Consent Status: Patient agreed to services and verbal consent obtained.   Follow up plan:  Telephone appointment with care coordination team member scheduled for:  01/02/23  Encounter Outcome:  Pt. Scheduled [facility] Coordination Care Guide  Direct Dial: [phone]

## 2023-01-02 ENCOUNTER — Ambulatory Visit: Payer: Self-pay | Admitting: Licensed Clinical Social Worker

## 2023-01-02 NOTE — Patient Outreach (Signed)
  Care Coordination   Initial Visit Note   01/02/2023 Name: Maria Day MRN: [id number] DOB: 28-Oct-1995  Maria Day is a 27 y.o. year old female who sees Pcp, No for primary care. I spoke with  Maria Day / Maria Day, mother of client via phone today about client needs.  What matters to the patients health and wellness today?  Patient has anxiety and stress issues. She takes prescribed  Zyprexa . She has difficulty sleeping alone    Goals Addressed             This Visit's Progress    patient has anxiety and stress issues. She takes prescribed Zyprexa.  She has difficulty sleeping alone       Interventions Spoke with Maria Day, mother of client, about client needs Reviewed program support with RN, Pharmacist, LCSW Discussed mental health needs of client. Client sees psychiatrist for mental health support. Client is looking for new counselor at this time. Client takes Zprexa as prescribed Discussed family support. Client resides at present with her mother , Maria Day Client has lost 2 jobs. She has some difficulty with anxiety and stress issues. She has had history of seizures. Maria Day reported that client has not had a seizure in 9 months Discussed fact that mother provides care for client and also that father also provides some care for client.  Mother and father of client reside in different residences.  Client will sometimes stay with her mother for care. Sometimes she will stay with her father for care.   Discussed PCP status of client. Suggested to Maria Day that perhaps she or client might talk with Dr. [name], neurologist to see if client could list Dr. [name] as client PCP.   Maria Day said client is appreciative of support of Dr. [name], neurologist and is appreciative of support of psychiatrist LCSW gave Maria Lobatos LCSW name and phone number and encouraged Maria Day or client to call LCSW for  SW support as needed.         SDOH  assessments and interventions completed:  Yes  SDOH Interventions Today    Flowsheet Row Most Recent Value  SDOH Interventions   Depression Interventions/Treatment  Medication  Stress Interventions Other (Comment)  [clent has anxiety issues. she has stress issues. has had difficulty keeping a job]        Care Coordination Interventions:  Yes, provided   Interventions Today    Flowsheet Row Most Recent Value  Chronic Disease   Chronic disease during today's visit Other  [spoke with Maria Day, mother of client, about client needs]  General Interventions   General Interventions Discussed/Reviewed General Interventions Discussed, Community Resources  [discussed program support]  Education Interventions   Education Provided Provided Education  Provided Verbal Education On [employer] client support with psychiatrist,  discussed client support with neurologist]  Mental Health Interventions   Mental Health Discussed/Reviewed Anxiety, Coping Strategies   [client has anxiety issue. client has stress issues. She takes Zyprexa as prescribed. She sees psychiatrist for mental health care]  Pharmacy Interventions   Pharmacy Dicussed/Reviewed Pharmacy Topics Discussed  Safety Interventions   Safety Discussed/Reviewed Fall Risk        Follow up plan: Care Guide to schedule next LCSW phone call with client or Maria Day   Encounter Outcome:  Pt. Visit Completed   Kelton Pillar.Thadius Smisek MSW, LCSW Licensed Visual merchandiser [facility] Care Management [phone]

## 2023-01-02 NOTE — Patient Instructions (Signed)
Visit Information  Thank you for taking time to visit with me today. Please don't hesitate to contact me if I can be of assistance to you.   Following are the goals we discussed today:   Goals Addressed             This Visit's Progress    patient has anxiety and stress issues. She takes prescribed Zyprexa.  She has difficulty sleeping alone       Interventions Spoke with [name], mother of client, about client needs Reviewed program support with RN, Pharmacist, LCSW Discussed mental health needs of client. Client sees psychiatrist for mental health support. Client is looking for new counselor at this time. Client takes Zprexa as prescribed Discussed family support. Client resides at present with her mother , [name] Client has lost 2 jobs. She has some difficulty with anxiety and stress issues. She has had history of seizures. [name] reported that client has not had a seizure in 9 months Discussed fact that mother provides care for client and also that father also provides some care for client.  Mother and father of client reside in different residences.  Client will sometimes stay with her mother for care. Sometimes she will stay with her father for care.   Discussed PCP status of client. Suggested to [name] that perhaps she or client might talk with Dr. [name], neurologist to see if client could list Dr. [name] as client PCP.   [name] said client is appreciative of support of Dr. [name], neurologist and is appreciative of support of psychiatrist LCSW gave [name] LCSW name and phone number and encouraged [name] or client to call LCSW for  SW support as needed.         Care Guide to schedule next LCSW phone call with client or with [name]   Please call the care guide team at [phone] if you need to cancel or reschedule your appointment.   If you are experiencing a Mental Health or Behavioral Health Crisis or need someone to talk  to, please go to [facility] [address], [place] [phone])   The patient/ Maria Day, mother, verbalized understanding of instructions, educational materials, and care plan provided today and DECLINED offer to receive copy of patient instructions, educational materials, and care plan.   The patient / Maria Day, mother,  has been provided with contact information for the care management team and has been advised to call with any health related questions or concerns.   Kelton Pillar.Nitish Roes MSW, LCSW Licensed Visual merchandiser [facility] Care Management [phone]

## 2023-01-04 ENCOUNTER — Other Ambulatory Visit (HOSPITAL_COMMUNITY): Payer: Self-pay | Admitting: Psychiatry

## 2023-01-04 DIAGNOSIS — F411 Generalized anxiety disorder: Secondary | ICD-10-CM

## 2023-01-04 DIAGNOSIS — F0630 Mood disorder due to known physiological condition, unspecified: Secondary | ICD-10-CM

## 2023-01-04 DIAGNOSIS — F419 Anxiety disorder, unspecified: Secondary | ICD-10-CM

## 2023-01-04 DIAGNOSIS — F819 Developmental disorder of scholastic skills, unspecified: Secondary | ICD-10-CM

## 2023-01-04 DIAGNOSIS — F902 Attention-deficit hyperactivity disorder, combined type: Secondary | ICD-10-CM

## 2023-01-11 ENCOUNTER — Other Ambulatory Visit (HOSPITAL_COMMUNITY): Payer: Self-pay | Admitting: *Deleted

## 2023-01-11 DIAGNOSIS — F411 Generalized anxiety disorder: Secondary | ICD-10-CM

## 2023-01-11 DIAGNOSIS — F419 Anxiety disorder, unspecified: Secondary | ICD-10-CM

## 2023-01-11 DIAGNOSIS — F819 Developmental disorder of scholastic skills, unspecified: Secondary | ICD-10-CM

## 2023-01-11 DIAGNOSIS — F0630 Mood disorder due to known physiological condition, unspecified: Secondary | ICD-10-CM

## 2023-01-11 DIAGNOSIS — F902 Attention-deficit hyperactivity disorder, combined type: Secondary | ICD-10-CM

## 2023-01-11 MED ORDER — OLANZAPINE 5 MG PO TABS
5.0000 mg | ORAL_TABLET | Freq: Every day | ORAL | 0 refills | Status: DC
Start: 2023-01-11 — End: 2023-01-17

## 2023-01-17 ENCOUNTER — Other Ambulatory Visit: Payer: Self-pay

## 2023-01-17 ENCOUNTER — Ambulatory Visit (HOSPITAL_BASED_OUTPATIENT_CLINIC_OR_DEPARTMENT_OTHER): Payer: Medicare Other | Admitting: Psychiatry

## 2023-01-17 ENCOUNTER — Encounter (HOSPITAL_COMMUNITY): Payer: Self-pay | Admitting: Psychiatry

## 2023-01-17 VITALS — BP 104/73 | HR 88 | Ht 60.0 in | Wt 180.0 lb

## 2023-01-17 DIAGNOSIS — F411 Generalized anxiety disorder: Secondary | ICD-10-CM

## 2023-01-17 DIAGNOSIS — F0630 Mood disorder due to known physiological condition, unspecified: Secondary | ICD-10-CM | POA: Diagnosis not present

## 2023-01-17 DIAGNOSIS — F819 Developmental disorder of scholastic skills, unspecified: Secondary | ICD-10-CM

## 2023-01-17 DIAGNOSIS — F902 Attention-deficit hyperactivity disorder, combined type: Secondary | ICD-10-CM

## 2023-01-17 MED ORDER — OLANZAPINE 5 MG PO TABS
5.0000 mg | ORAL_TABLET | Freq: Every day | ORAL | 2 refills | Status: DC
Start: 2023-01-17 — End: 2023-03-14

## 2023-01-17 NOTE — Progress Notes (Signed)
BH MD/PA/NP OP Progress Note  01/17/2023 11:39 AM Maria Day  MRN:  [id number]  Chief Complaint:  Chief Complaint  Patient presents with   Follow-up   Anxiety   HPI: Patient came today in the office with her mother.  She has been doing much better since started the olanzapine.  She is taking Klonopin 2 times a day and very rarely she needed third dose.  Her mother called wondering if Klonopin can further increase as patient still have moments of anxiety and panic attacks.  She is waiting for her vocational rehab evaluation appointment with this coming up next month.  Her sleep is improved.  She does spend time with her mother and next week she has a birthday and going to beach with the family.  Patient denies any hallucination, paranoia, suicidal thoughts.  She is taking multiple medication for seizures and denies any recent seizure-like activity.  She is no longer taking Geodon and BuSpar which was given in the past but discontinued after ineffective.  She has no tremors, shakes or any EPS.  Her appetite is better.  She denies any hallucination, mania or agitation.  Her mother endorses improvement in her mood.  Visit Diagnosis:    ICD-10-CM   1. Mood disorder in conditions classified elsewhere  F06.30 OLANZapine (ZYPREXA) 5 MG tablet    2. Attention deficit hyperactivity disorder, combined type  F90.2 OLANZapine (ZYPREXA) 5 MG tablet    3. GAD (generalized anxiety disorder)  F41.1 OLANZapine (ZYPREXA) 5 MG tablet    4. Learning disorder  F81.9 OLANZapine (ZYPREXA) 5 MG tablet        Past Psychiatric History: H/O oppositional defiant, mood swings, LD and ADHD. Had psychological testing with IQ around 73.  H/O struggle with grades in school but with help of VR finished high school with good grades. Took Concerta for ADD and Risperdal for mood symptoms.  She was given BuSpar and Geodon which was discontinued after ineffective response.  No history of suicidal attempt, inpatient or abuse.    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 No pertinent surgical history.  Family Psychiatric History: Reviewed.  Family History:  Family History  Problem Relation Age of Onset   Depression Mother    Cancer Father        Leukemia   Schizophrenia Sister    Drug abuse Brother    Suicidality Maternal Grandmother    Hyperlipidemia Maternal Grandfather    Diabetes Paternal Grandfather     Social History:  Social History   Socioeconomic History   Marital status: Single    Spouse name: Not on file   Number of children: Not on file   Years of education: 12   Highest education level: High school graduate  Occupational History   Occupation: Unemployed  Tobacco Use   Smoking status: Former    Types: Cigarettes   Smokeless tobacco: Never  Vaping Use   Vaping status: Never Used  Substance and Sexual Activity   Alcohol use: No   Drug use:  Not Currently    Types: Marijuana   Sexual activity: Never  Other Topics Concern   Not on file  Social History Narrative   Left handed    Social Determinants of Health   Financial Resource Strain: Not on file  Food Insecurity: Not on file  Transportation Needs: Not on file  Physical Activity: Not on file  Stress: Stress Concern Present (01/02/2023)   [employer] of Occupational Health - Occupational Stress Questionnaire    Feeling of Stress : Rather much  Social Connections: Unknown  (11/13/2021)   Received [facility]   Social Network    Social Network: Not on file    Allergies:  Allergies  Allergen Reactions   Penicillins Rash   Cinnamon Swelling   Amoxicillin Rash    Metabolic Disorder Labs: No results found for: "HGBA1C", "MPG" No results found for: "PROLACTIN" No results found for: "CHOL", "TRIG", "HDL", "CHOLHDL", "VLDL", "LDLCALC" Lab Results  Component Value Date   TSH 3.595 08/21/2021    Therapeutic Level Labs: No results found for: "LITHIUM" No results found for: "VALPROATE" No results found for: "CBMZ"  Current Medications: Current Outpatient Medications  Medication Sig Dispense Refill   lamoTRIgine (LAMICTAL) 200 MG tablet Take 1 tablet (200 mg total) by mouth 2 (two) times daily. 180 tablet 3   levETIRAcetam (KEPPRA) 1000 MG tablet Take 1 tablet twice a day (Take with 500 mg tablet for a total of 1500 mg twice a day) 180 tablet 3   levETIRAcetam (KEPPRA) 500 MG tablet Take 1 tablet twice a day (Take with 1000mg  tablet for a total of 1500mg  twice a day). 180 tablet 3   OLANZapine (ZYPREXA) 5 MG tablet Take 1 tablet (5 mg total) by mouth at bedtime. 7 tablet 0   clonazePAM (KLONOPIN) 0.5 MG tablet Take 1 tablet (0.5 mg total) by mouth 2 (two) times daily. (Patient taking differently: Take 0.5 mg by mouth 3 (three) times daily as needed.) 60 tablet 5   metroNIDAZOLE (FLAGYL) 500 MG tablet Take 500 mg by mouth 2 (two) times daily. (Patient not taking: Reported on 01/17/2023)     No current facility-administered medications for this visit.     Musculoskeletal: Strength & Muscle Tone: within normal limits Gait & Station: normal Patient leans: N/A  Psychiatric Specialty Exam: Physical Exam  Review of Systems  Blood pressure 104/73, pulse 88, height 5' (1.524 m), weight 180 lb (81.6 kg).Body mass index is 35.15 kg/m.  General Appearance: Fairly Groomed  Eye Contact:  Fair  Speech:  Slow  Volume:  Decreased  Mood:  Anxious   Affect:  Congruent  Thought Process:  Descriptions of Associations: Intact  Orientation:  Full (Time, Place, and Person)  Thought Content:  Rumination  Suicidal Thoughts:  No  Homicidal Thoughts:  No  Memory:  Immediate;   Fair Recent;   Fair Remote;   Fair  Judgement:  Fair  Insight:  Shallow  Psychomotor Activity:  Decreased  Concentration:  Concentration: Fair and Attention Span: Fair  Recall:  [employer] of Knowledge:  Fair  Language:  Good  Akathisia:  No  Handed:  Right  AIMS (if indicated):     Assets:  Communication Skills Desire for Improvement Social Support  ADL's:  Intact  Cognition:  WNL  Sleep:   good     Screenings: GAD-7    Flowsheet Row Office Visit from [facility]/30/2022 in [facility]  Total GAD-7 Score 19      PHQ2-9  Flowsheet Row Care Coordination from 01/02/2023 in [facility]  PHQ-2 Total Score 2 2  PHQ-9 Total Score 6 5      Flowsheet Row ED from 06/18/2022 in [facility] Office Visit from [facility]/30/2022 [facility] Admission (Discharged) from 08/21/2021 in [facility]  C-SSRS RISK CATEGORY No Risk No Risk No Risk        Assessment and Plan: I reviewed current medication.  Patient doing better with olanzapine and her mood is much better.  She is sleeping good.  She still have a lot of nervousness and anxiety around people.  Her mother wants her to go outside but patient does not feel comfortable around people.  She has appointment coming up with vocational rehab next month.  I encouraged to keep that appointment.  We have referred therapy with Ms. Lucretia Field but patient is not consistent.  She really liked the olanzapine and Klonopin.  She is no longer taking BuSpar and Geodon.   Encourage walking, exercise, breathing technique but also make effort to go outside and in case she has panic attack then she can take the Klonopin.  For now continue Klonopin 0.25 mg twice a day as needed for anxiety which has recently given by neurologist.  She is also on Lamictal and Keppra for seizure disorder.  Continue olanzapine 5 mg at bedtime.  Encourage consider therapy to help her coping skills for anxiety and panic attacks.  Recommend to call us back if you have any question or any concern.  Patient excited about upcoming trip to beach on her birthday.  We will follow-up in 3 months.  I spent 28 minutes talking to the patient and her mother explaining about future treatment plan, long-term prognosis and provided psychoeducation.  Recommend to call us back if she is any question or any concern.  Follow-up in 3 months.   Collaboration of Care: Collaboration of Care: Other provider involved in patient's care AEB notes are available in epic to review.  Patient/Guardian was advised Release of Information must be obtained prior to any record release in order to collaborate their care with an outside provider. Patient/Guardian was advised if they have not already done so to contact the registration department to sign all necessary forms in order for [place] to release information regarding their care.   Consent: Patient/Guardian gives verbal consent for treatment and assignment of benefits for services provided during this visit. Patient/Guardian expressed understanding and agreed to proceed.    Cleotis Nipper, MD 01/17/2023, 11:39 AM

## 2023-01-21 ENCOUNTER — Telehealth: Payer: Self-pay | Admitting: Neurology

## 2023-01-21 NOTE — Telephone Encounter (Signed)
Pt is calling in stating that she is having this feeling of out of body experience really can not clearly explain it and not sure if this is something related to her seizures and would like to see if she can get a call back to elaborate more on it.

## 2023-01-22 ENCOUNTER — Telehealth: Payer: Self-pay | Admitting: Physician Assistant

## 2023-01-22 NOTE — Telephone Encounter (Signed)
I left message to call office at 10:24 10/23/2022

## 2023-01-22 NOTE — Telephone Encounter (Signed)
Patient advised to check with insurance regarding therapist. She thanked me for calling.

## 2023-01-29 ENCOUNTER — Ambulatory Visit: Payer: Self-pay | Admitting: Licensed Clinical Social Worker

## 2023-01-29 ENCOUNTER — Ambulatory Visit: Payer: Medicare Other | Admitting: Occupational Therapy

## 2023-01-29 NOTE — Patient Instructions (Signed)
Visit Information  Thank you for taking time to visit with me today. Please don't hesitate to contact me if I can be of assistance to you.   Following are the goals we discussed today:   Goals Addressed             This Visit's Progress    patient has anxiety and stress issues. She takes prescribed Zyprexa.  She has difficulty sleeping alone       Interventions Spoke with [name], mother of client, via phone today about client status and current needs Reviewed program support with RN, Pharmacist, LCSW Discussed mental health needs of client. Client sees psychiatrist for mental health support. Client is looking for new counselor at this time. Client takes Zprexa as prescribed.  Mayana Duboise said she thought that client anxiety had improved slightly.  She said client did some volunteer work today at [employer]. Client is taking medications as prescribed Discussed family support. Client resides at present with her mother , Makari Siek thought client had phone visit with LCSW today. Informed [name] that only appointment on schedule today for client was with an Occupational therapist. [name] to call OT office to discuss missed appointment for client today with OT. Encouraged client or Lakaia Lesch to call LCSW as needed for SW support for client at [phone] Berkleigh Imholte was appreciative of phone call from LCSW today        LCSW to call client or Dandre Baringer, mother of client, as scheduled to address client needs   Please call the care guide team at [phone] if you need to cancel or reschedule your appointment.   If you are experiencing a Mental Health or Behavioral Health Crisis or need someone to talk to, please go to [facility] [address], [place] [phone])   The patient / Semeka Ellestad , mother of patient,verbalized understanding of instructions, educational materials, and care plan provided today and DECLINED  offer to receive copy of patient instructions, educational materials, and care plan.   The patient / Daylee Cervin, mother of patient, has been provided with contact information for the care management team and has been advised to call with any health related questions or concerns.   Kelton Pillar.Myrla Malanowski MSW, LCSW Licensed Visual merchandiser [facility] Care Management [phone]

## 2023-01-29 NOTE — Patient Outreach (Signed)
  Care Coordination   Follow Up Visit Note   01/29/2023 Name: [name] MRN: [id number] DOB: January 18, 1996  Maria Day is a 27 y.o. year old female who sees Pcp, No for primary care. I spoke with  Maria Day / Maria Day, mother of client via phone today about client needs .  What matters to the patients health and wellness today?  Patient has anxiety and stress issues. She takes prescribed Zyprexa . She has difficulty sleeping alone    Goals Addressed             This Visit's Progress    patient has anxiety and stress issues. She takes prescribed Zyprexa.  She has difficulty sleeping alone       Interventions Spoke with Maria Day, mother of client, via phone today about client status and current needs Reviewed program support with RN, Pharmacist, LCSW Discussed mental health needs of client. Client sees psychiatrist for mental health support. Client is looking for new counselor at this time. Client takes Zprexa as prescribed.  Maria Day said she thought that client anxiety had improved slightly.  She said client did some volunteer work today at [employer]. Client is taking medications as prescribed Discussed family support. Client resides at present with her mother , Maria Day thought client had phone visit with LCSW today. Informed Maria Day that only appointment on schedule today for client was with an Occupational therapist. Maria Day to call OT office to discuss missed appointment for client today with OT. Encouraged client or Maria Day to call LCSW as needed for SW support for client at [phone] Maria Day was appreciative of phone call from LCSW today        SDOH assessments and interventions completed:  Yes  SDOH Interventions Today    Flowsheet Row Most Recent Value  SDOH Interventions   Depression Interventions/Treatment  Medication  Stress Interventions Other (Comment)  [client has anxiety and stress issues. she is trying to manage  anxiety and stress]        Care Coordination Interventions:  Yes, provided    Interventions Today    Flowsheet Row Most Recent Value  Chronic Disease   Chronic disease during today's visit Other  [spoke with Maria Day, mother of client, about client needs]  General Interventions   General Interventions Discussed/Reviewed General Interventions Discussed, Community Resources  [discussed program support]  Education Interventions   Education Provided Provided Education  Provided Verbal Education On [employer] receives mental health support with psychiatrist]  Mental Health Interventions   Mental Health Discussed/Reviewed Coping Strategies, Anxiety  [client has anxiety and stress issues faced. she is taking medications as prescribed. She sees psychiatrist as scheduled]  Pharmacy Interventions   Pharmacy Dicussed/Reviewed Pharmacy Topics Discussed       Follow up plan: LCSW to call client or Maria Day, mother of client, as scheduled to address client needs at that time   Encounter Outcome:  Pt. Visit Completed   Kelton Pillar.Miroslava Santellan MSW, LCSW Licensed Visual merchandiser [facility] Care Management [phone]

## 2023-02-08 ENCOUNTER — Other Ambulatory Visit (HOSPITAL_COMMUNITY): Payer: Self-pay | Admitting: Psychiatry

## 2023-02-08 DIAGNOSIS — F819 Developmental disorder of scholastic skills, unspecified: Secondary | ICD-10-CM

## 2023-02-08 DIAGNOSIS — F902 Attention-deficit hyperactivity disorder, combined type: Secondary | ICD-10-CM

## 2023-02-08 DIAGNOSIS — F411 Generalized anxiety disorder: Secondary | ICD-10-CM

## 2023-02-08 DIAGNOSIS — F0630 Mood disorder due to known physiological condition, unspecified: Secondary | ICD-10-CM

## 2023-02-14 NOTE — Telephone Encounter (Signed)
Pt called back advised that metronidazole was a short term med prescribed by her PCP not [place] pt verbalized understanding

## 2023-02-14 NOTE — Telephone Encounter (Signed)
Pt called in  She has a question about the Metronidazole medication that was prescribe to her. She see's it on her MyChart stating that she's supposed to be taking it twice a day but it was never sent to a pharmacy. Her call back number is [phone]

## 2023-03-05 ENCOUNTER — Ambulatory Visit: Payer: Self-pay | Admitting: Licensed Clinical Social Worker

## 2023-03-05 NOTE — Patient Outreach (Signed)
  Care Coordination   Follow Up Visit Note   03/05/2023 Name: Maria Day MRN: [id number] DOB: 1995/10/02  Maria Day is a 27 y.o. year old female who sees Pcp, No for primary care. I spoke with  Maria Day by phone today.  What matters to the patients health and wellness today?  Patient has anxiety and stress issues. She has difficulty sleeping alone    Goals Addressed             This Visit's Progress    patient has anxiety and stress issues. She takes prescribed Zyprexa.  She has difficulty sleeping alone       Interventions Spoke with client via phone about client needs. Client has strong support from her mother, Maria Day Reviewed program support with RN, Pharmacist, LCSW Discussed mental health needs of client. Client sees psychiatrist for mental health support. Client is looking for new counselor at this time. LCSW suggested that client talk with practice representative at psychiatrist office to see if counselor/therapist is available to talk with client through that mental health practice Client takes Zprexa as prescribed.  Client said she has increased appetite. She feels that she has gained 8 to 10 pounds since taking Zyprexa Discussed client volunteer work at [employer] Discussed relaxation activities of client Discussed sleeping issues of client. Client is fearful of sleeping alone. Discussed that client may be able to talk with therapist when she is aligned with therapist about her sleeping challenges Encouraged client or her mother, Maria Day to call LCSW as needed for SW support for client at [phone]. Thanked client for phone call with LCSW today         SDOH assessments and interventions completed:  Yes  SDOH Interventions Today    Flowsheet Row Most Recent Value  SDOH Interventions   Depression Interventions/Treatment  Counseling, Medication  Stress Interventions Provide Counseling  [has stress and anxiety issues she faces]        Care  Coordination Interventions:  Yes, provided   Interventions Today    Flowsheet Row Most Recent Value  Chronic Disease   Chronic disease during today's visit Other  [spoke with client about client needs]  General Interventions   General Interventions Discussed/Reviewed General Interventions Discussed, Community Resources  Exercise Interventions   Exercise Discussed/Reviewed Physical Activity  Education Interventions   Education Provided Provided Education  Provided Verbal Education On Community Resources  Mental Health Interventions   Mental Health Discussed/Reviewed Coping Strategies, Anxiety  [discussed client mood and anxiety issues. she sees psychiatrist for mental health support. she is seeking a Veterinary surgeon at present]  Nutrition Interventions   Nutrition Discussed/Reviewed Nutrition Discussed  [client has increased appetite]  Pharmacy Interventions   Pharmacy Dicussed/Reviewed Pharmacy Topics Discussed        Follow up plan: Follow up call scheduled for 04/23/23 at 2:30 PM     Encounter Outcome:  Pt. Visit Completed   Maria Day.Maria Day MSW, LCSW Licensed Visual merchandiser [facility] Care Management [phone]

## 2023-03-05 NOTE — Patient Instructions (Signed)
Visit Information  Thank you for taking time to visit with me today. Please don't hesitate to contact me if I can be of assistance to you.   Following are the goals we discussed today:   Goals Addressed             This Visit's Progress    patient has anxiety and stress issues. She takes prescribed Zyprexa.  She has difficulty sleeping alone       Interventions Spoke with client via phone about client needs. Client has strong support from her mother, Korri Huver Reviewed program support with RN, Pharmacist, LCSW Discussed mental health needs of client. Client sees psychiatrist for mental health support. Client is looking for new counselor at this time. LCSW suggested that client talk with practice representative at psychiatrist office to see if counselor/therapist is available to talk with client through that mental health practice Client takes Zprexa as prescribed.  Client said she has increased appetite. She feels that she has gained 8 to 10 pounds since taking Zyprexa Discussed client volunteer work at [employer] Discussed relaxation activities of client Discussed sleeping issues of client. Client is fearful of sleeping alone. Discussed that client may be able to talk with therapist when she is aligned with therapist about her sleeping challenges Encouraged client or her mother, Auzhane Slutsky to call LCSW as needed for SW support for client at [phone]. Thanked client for phone call with LCSW today         Our next appointment is by telephone on 04/23/23 at 2:30 PM   Please call the care guide team at [phone] if you need to cancel or reschedule your appointment.   If you are experiencing a Mental Health or Behavioral Health Crisis or need someone to talk to, please go to [facility] [address] [place] [phone])   The patient verbalized understanding of instructions, educational materials, and care plan provided today and  DECLINED offer to receive copy of patient instructions, educational materials, and care plan.   The patient has been provided with contact information for the care management team and has been advised to call with any health related questions or concerns.   Kelton Pillar.Tatiyanna Lashley MSW, LCSW Licensed Visual merchandiser [facility] Care Management [phone]

## 2023-03-07 ENCOUNTER — Other Ambulatory Visit: Payer: Self-pay

## 2023-03-07 ENCOUNTER — Ambulatory Visit: Payer: Medicare Other | Attending: Neurology | Admitting: Occupational Therapy

## 2023-03-07 DIAGNOSIS — F70 Mild intellectual disabilities: Secondary | ICD-10-CM | POA: Diagnosis not present

## 2023-03-07 DIAGNOSIS — R41840 Attention and concentration deficit: Secondary | ICD-10-CM | POA: Insufficient documentation

## 2023-03-07 DIAGNOSIS — I69815 Cognitive social or emotional deficit following other cerebrovascular disease: Secondary | ICD-10-CM | POA: Diagnosis not present

## 2023-03-07 NOTE — Therapy (Signed)
OUTPATIENT OCCUPATIONAL THERAPY NEURO EVALUATION  Patient Name: Maria Day MRN: [id number] DOB:December 29, 1995, 27 y.o., female Today's Date: 03/07/2023  PCP: no PCP on file REFERRING PROVIDER: Van Clines, MD  END OF SESSION:  OT End of Session - 03/07/23 1630     Visit Number 1    Number of Visits 4    Date for OT Re-Evaluation 04/19/23    Authorization Type Medicare A&B    OT Start Time 1018    OT Stop Time 1108    OT Time Calculation (min) 50 min             Past Medical History:  Diagnosis Date   Acanthosis nigricans 11/18/2014   ADHD (attention deficit hyperactivity disorder), combined type 12/08/2013   Generalized anxiety disorder, severe 11/05/2022   Major depressive disorder 11/05/2022   Migraine without aura and without status migrainosus, not intractable 05/31/2017   Mild intellectual disability 11/05/2022   Myoclonus    Nonintractable generalized idiopathic epilepsy without status epilepticus 12/17/2016   Obesity 11/18/2014   OSA (obstructive sleep apnea) 04/10/2018   Ureterolithiasis    No past surgical history on file. 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   Generalized anxiety disorder, severe    Major depressive disorder    OSA (obstructive sleep apnea) 04/10/2018   Migraine without aura and without status migrainosus, not intractable 05/31/2017   Nonintractable generalized idiopathic epilepsy without status epilepticus 12/17/2016   Obesity 11/18/2014   Acanthosis nigricans 11/18/2014   ADHD (attention deficit hyperactivity disorder), combined type 12/08/2013   Myoclonus 07/07/2013    Class: Acute    ONSET DATE: referral date 12/28/22  REFERRING DIAG: F70 (ICD-10-CM) - Mild intellectual disability  THERAPY DIAG:  Attention and concentration deficit  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  Pt reports that she would like to be able to live on her own again, but also reports that she is not motivated to do household tasks and even some self-care tasks.  Pt was living with roommates and reports that due to her anxiety she was unable to sleep alone if they were not in the home.  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 Pt's mother has concerns about pt's motivation to engage in household tasks and self-care tasks to facilitate return to more independent living.   Pt accompanied by: self and family member (mother)  PERTINENT HISTORY: Mild intellectual disability; generalized anxiety disorder, severe; major depressive disorder; Epilepsy; ADHD  Per Neuropsychologist Dr. Milbert Coulter: she has borderline/mild intellectual disability and a desire to live independently. Any therapies which could be helpful in increasing the potential success rate of this given her comorbid medical conditions would be helpful.  PRECAUTIONS: Other: epilepsy and migraines  WEIGHT BEARING RESTRICTIONS: No  PAIN:  Are you having pain? No  FALLS: Has patient fallen in last 6 months? No  LIVING ENVIRONMENT: Lives with: lives with their family (primarily lives with mother, will spend some time at father's home) Lives in: House/apartment  PLOF: Independent and Vocation/Vocational requirements: has lost 2 jobs due to having seizures on the job  PATIENT GOALS: to live alone  OBJECTIVE:   HAND DOMINANCE: Left  ADLs:  Overall ADLs: Pt is able to complete all, however lacks the motivation to complete at frequency that mother would desire  IADLs: Light housekeeping: washes clothes, puts the dishes away if mother asks her to Meal Prep: will cook dinner occasionally  ACTIVITY  TOLERANCE: Activity tolerance: WFL for tasks assessed  FUNCTIONAL OUTCOME MEASURES: SLUMS: Pt scored 19/30 showing dementia level cognitive impairment, however lacking 4 points due to clock drawing test which she reports she does not read analog clocks.  UPPER EXTREMITY ROM:  WFL  COGNITION: Overall cognitive status: History of cognitive impairments - at baseline  OBSERVATIONS: Pt somewhat defensive with mother pointing out her areas of concern.  Pt also reporting that she is able to perform many household tasks but lacks the drive and desire to complete.     TODAY'S TREATMENT:                                                                              DATE:  03/07/23 Engaged in discussion regarding functional implications of OT and recommendations for pt to become established with a therapist who can assist her with her anxiety, considering a therapist that can meet with her individually and possibly even meet with her and her mother together to address areas of concern.  Discussed possible benefit of vocational rehab to prepare for job specific tasks while keeping in mind limitations due to anxiety, migraines, and seizures.     PATIENT EDUCATION: Education details: Educated on role and purpose of OT as well as potential interventions and goals for therapy based on initial evaluation findings. Person educated: Patient and Parent Education method: Explanation Education comprehension: verbalized understanding  HOME EXERCISE PROGRAM: TBD   GOALS: Goals reviewed with patient? Yes   LONG TERM GOALS: Target date: 04/19/23  Pt will pursue and become established with a psychologist/psychiatrist to aid in anxiety limiting return to work and/or living independently. Baseline:  Goal status: INITIAL  2.  Pt will pursue and become established with vocational rehab to further address areas of concern with return to work. Baseline:  Goal status: INITIAL  3.  Pt will report improved  engagement in housekeeping tasks with fewer than min cues for initiation. Baseline:  Goal status: INITIAL   ASSESSMENT:  CLINICAL IMPRESSION: Patient is a 27 y.o. female who was seen today for occupational therapy evaluation for impairments impacting goals of returning to living independently and returning to work. Pt presented somewhat defensive when mother would point out areas of concern or limited engagement in ADLs and IADLs.  Pt with h/o impulsivity and/or decreased awareness due to mild cognitive impairments and anxiety making questionable decisions which has mother concerned about pt returning to living alone.  Pt reports decreased motivation to participate in any IADLs and mother reports decreased engagement in self-care habits.  Pt would benefit from becoming established with an therapist who can provide independent and possibly family therapy for pt and family to address anxiety and medical comorbidities limiting pt ability to function at a more independent level.  Pt will benefit from skilled occupational therapy to serve as a liaison to assist in locating services and becoming established with additional services in the community to progress towards more independent  living.  OT requesting pt return in ~2 weeks to allow time to make contact with resources provided during evaluation.      PERFORMANCE DEFICITS: in functional skills including ADLs and IADLs, cognitive skills including energy/drive, memory, safety awareness, and temperament/personality, and psychosocial skills including coping strategies and routines and behaviors.   IMPAIRMENTS: are limiting patient from ADLs, IADLs, rest and sleep, and work.   CO-MORBIDITIES: may have co-morbidities  that affects occupational performance. Patient will benefit from skilled OT to address above impairments and improve overall function.  MODIFICATION OR ASSISTANCE TO COMPLETE EVALUATION: No modification of tasks or assist necessary to  complete an evaluation.  OT OCCUPATIONAL PROFILE AND HISTORY: Detailed assessment: Review of records and additional review of physical, cognitive, psychosocial history related to current functional performance.  CLINICAL DECISION MAKING: LOW - limited treatment options, no task modification necessary  REHAB POTENTIAL: Fair    EVALUATION COMPLEXITY: Low    PLAN:  OT FREQUENCY: every other week  OT DURATION: 6 weeks (4 weeks, but requesting 6 as pt will be out of town next 2-3 weeks)  PLANNED INTERVENTIONS: self care/ADL training, therapeutic activity, patient/family education, cognitive remediation/compensation, psychosocial skills training, and coping strategies training  RECOMMENDED OTHER SERVICES: NA  CONSULTED AND AGREED WITH PLAN OF CARE: Patient and family member/caregiver  PLAN FOR NEXT SESSION: assess engagement in IADLs and if pt/family made contact with psychologist/psychiatrist   Rosalio Loud, OTR/L 03/07/2023, 4:33 PM

## 2023-03-11 ENCOUNTER — Telehealth (HOSPITAL_COMMUNITY): Payer: Self-pay

## 2023-03-11 NOTE — Telephone Encounter (Signed)
Patient called and said she has been feeling like she is "out of her body". She is not anxious or feeling like her skin is crawling but rather like she is in the room but watching from somewhere else? She was not sure how to explain it and wanted to know if it had something to do with her medications. Please review and advise, thank you

## 2023-03-11 NOTE — Telephone Encounter (Signed)
She is taking these medication for a while and if she feels medicine causing then she need an earlier appointment.

## 2023-03-14 ENCOUNTER — Telehealth (HOSPITAL_BASED_OUTPATIENT_CLINIC_OR_DEPARTMENT_OTHER): Payer: Medicare Other | Admitting: Psychiatry

## 2023-03-14 ENCOUNTER — Encounter (HOSPITAL_COMMUNITY): Payer: Self-pay | Admitting: Psychiatry

## 2023-03-14 DIAGNOSIS — F819 Developmental disorder of scholastic skills, unspecified: Secondary | ICD-10-CM

## 2023-03-14 DIAGNOSIS — F411 Generalized anxiety disorder: Secondary | ICD-10-CM

## 2023-03-14 DIAGNOSIS — F39 Unspecified mood [affective] disorder: Secondary | ICD-10-CM | POA: Diagnosis not present

## 2023-03-14 DIAGNOSIS — F0630 Mood disorder due to known physiological condition, unspecified: Secondary | ICD-10-CM

## 2023-03-14 DIAGNOSIS — F902 Attention-deficit hyperactivity disorder, combined type: Secondary | ICD-10-CM | POA: Diagnosis not present

## 2023-03-14 MED ORDER — OLANZAPINE 5 MG PO TABS
5.0000 mg | ORAL_TABLET | Freq: Every day | ORAL | 2 refills | Status: DC
Start: 2023-03-14 — End: 2023-05-28

## 2023-03-14 NOTE — Progress Notes (Signed)
[facility] MD Virtual Progress Note   Patient Location: [facility] Provider Location: Office  I connect with patient by video and verified that I am speaking with correct person by using two identifiers. I discussed the limitations of evaluation and management by telemedicine and the availability of in person appointments. I also discussed with the patient that there may be a patient responsible charge related to this service. The patient expressed understanding and agreed to proceed.  Maria Day [id number] 27 y.o.  03/14/2023 4:20 PM  History of Present Illness:  Patient requested an earlier appointment.  She is at her grandmother's house in [place].  She reported started to have dissociated symptoms which she feels very intense during the day.  She feel nervous and anxious.  She is not sure what triggered her these dissociative symptoms.  She explained sometimes feeling out of her body but denies any hallucination, paranoia, suicidal thoughts.  Other than these episodes she feels very good about herself.  She denies any crying spells, suicidal thoughts and she sleeps good.  She gained few pounds since the last visit but no agitation, anger, mood swings.  We have recommended to try Klonopin 0.5 mg 3 times a day which is prescribed by Dr. [name] but she keep the same frequency.  She used to see therapist Mr. Durwin Nora but has not been in therapy for a while.  In the past she had tried Geodon, BuSpar but that did not work either.  She had a testing by a psychologist at neurology office.  She has no tremor or shakes or any EPS.  She denies any aggression, violence.  Her grandmother is concerned if Klonopin causing dissociated symptoms.  She is taking olanzapine at bedtime which is helping her sleep.  She denies drinking or using any illegal substances.  She reported her impulsivity is much better.  She still struggle with attention and focus but able to function.  Past  Psychiatric History: H/O oppositional defiant, mood swings, LD and ADHD. Had psychological testing with IQ around 62.  H/O struggle with grades in school but with help of VR finished high school with good grades. Took Concerta for ADD and Risperdal for mood symptoms.  She was given BuSpar and Geodon which was discontinued after ineffective response.  No history of suicidal attempt, inpatient or abuse.    Outpatient Encounter Medications as of 03/14/2023  Medication Sig   clonazePAM (KLONOPIN) 0.5 MG tablet Take 1 tablet (0.5 mg total) by mouth 2 (two) times daily. (Patient taking differently: Take 0.5 mg by mouth 3 (three) times daily as needed.)   lamoTRIgine (LAMICTAL) 200 MG tablet Take 1 tablet (200 mg total) by mouth 2 (two) times daily.   levETIRAcetam (KEPPRA) 1000 MG tablet Take 1 tablet twice a day (Take with 500 mg tablet for a total of 1500 mg twice a day)   levETIRAcetam (KEPPRA) 500 MG tablet Take 1 tablet twice a day (Take with 1000mg  tablet for a total of 1500mg  twice a day).   metroNIDAZOLE (FLAGYL) 500 MG tablet Take 500 mg by mouth 2 (two) times daily. (Patient not taking: Reported on 01/17/2023)   OLANZapine (ZYPREXA) 5 MG tablet Take 1 tablet (5 mg total) by mouth at bedtime.   [DISCONTINUED] OLANZapine (ZYPREXA) 5 MG tablet Take 1 tablet (5 mg total) by mouth at bedtime.   No facility-administered encounter medications on file as of 03/14/2023.    No results found for this or any previous visit (from the past  2160 hour(s)).   Psychiatric Specialty Exam: Physical Exam  Review of Systems  Weight 183 lb (83 kg).Body mass index is 35.74 kg/m.  General Appearance: Fairly Groomed  Eye Contact:  Fair  Speech:  Slow  Volume:  Normal  Mood:  Anxious  Affect:  Congruent  Thought Process:  Descriptions of Associations: Intact  Orientation:  Full (Time, Place, and Person)  Thought Content:  Rumination  Suicidal Thoughts:  No  Homicidal Thoughts:  No  Memory:  Immediate;    Fair Recent;   Fair Remote;   Fair  Judgement:  Fair  Insight:  Shallow  Psychomotor Activity:  Decreased  Concentration:  Concentration: Fair and Attention Span: Fair  Recall:  [employer] of Knowledge:  Fair  Language:  Good  Akathisia:  No  Handed:  Right  AIMS (if indicated):     Assets:  Communication Skills Desire for Improvement Housing Social Support  ADL's:  Intact  Cognition:  WNL  Sleep:  ok     Assessment/Plan: Mood disorder in conditions classified elsewhere - Plan: OLANZapine (ZYPREXA) 5 MG tablet  Attention deficit hyperactivity disorder, combined type - Plan: OLANZapine (ZYPREXA) 5 MG tablet  GAD (generalized anxiety disorder) - Plan: OLANZapine (ZYPREXA) 5 MG tablet  Learning disorder - Plan: OLANZapine (ZYPREXA) 5 MG tablet  Discuss her dissociative symptoms which she described feeling out of the body.  I recommend should have consider taking the Klonopin 0.5 mg 3 times a day to help these anxiety and dissociative symptoms.  Her grandmother concerned that taking Klonopin may be causing however I explained Klonopin does help the anxiety and sometimes anxiety triggered these dissociated symptoms.  I recommend if taking 3 times Klonopin did not help then she can go back to twice a day.  She also on Lamictal and Keppra for seizures.  She noted sometime light bothers her and she does not go to stores for the same reason.  I encouraged to consider going back to therapy.  She used to see Ms. Lucretia Field but patient was not consistent.  I recommend to keep olanzapine.  I am reluctant to increase the olanzapine dose because of weight gain and patient also have the same concern.  I encourage walking, watching her calorie intake and exercise.  Follow-up in 3 months.  Patient like to have notes sent to neurology.   Follow Up Instructions:     I discussed the assessment and treatment plan with the patient. The patient was provided an opportunity to ask questions and all were  answered. The patient agreed with the plan and demonstrated an understanding of the instructions.   The patient was advised to call back or seek an in-person evaluation if the symptoms worsen or if the condition fails to improve as anticipated.    Collaboration of Care: Other provider involved in patient's care AEB notes are available in epic to review  Patient/Guardian was advised Release of Information must be obtained prior to any record release in order to collaborate their care with an outside provider. Patient/Guardian was advised if they have not already done so to contact the registration department to sign all necessary forms in order for [place] to release information regarding their care.   Consent: Patient/Guardian gives verbal consent for treatment and assignment of benefits for services provided during this visit. Patient/Guardian expressed understanding and agreed to proceed.     I provided 21 minutes of non face to face time during this encounter.  Note: This document was prepared  by [employer] and any errors that results from this process are unintentional.    Cleotis Nipper, MD 03/14/2023

## 2023-04-12 ENCOUNTER — Other Ambulatory Visit: Payer: Self-pay

## 2023-04-12 DIAGNOSIS — F419 Anxiety disorder, unspecified: Secondary | ICD-10-CM

## 2023-04-12 DIAGNOSIS — G40309 Generalized idiopathic epilepsy and epileptic syndromes, not intractable, without status epilepticus: Secondary | ICD-10-CM

## 2023-04-18 ENCOUNTER — Ambulatory Visit (HOSPITAL_COMMUNITY): Payer: Medicare Other | Admitting: Psychiatry

## 2023-04-22 ENCOUNTER — Ambulatory Visit (INDEPENDENT_AMBULATORY_CARE_PROVIDER_SITE_OTHER): Payer: Medicare Other | Admitting: Neurology

## 2023-04-22 ENCOUNTER — Encounter: Payer: Self-pay | Admitting: Neurology

## 2023-04-22 DIAGNOSIS — G40309 Generalized idiopathic epilepsy and epileptic syndromes, not intractable, without status epilepticus: Secondary | ICD-10-CM | POA: Diagnosis not present

## 2023-04-22 DIAGNOSIS — F419 Anxiety disorder, unspecified: Secondary | ICD-10-CM

## 2023-04-22 MED ORDER — CLONAZEPAM 0.5 MG PO TABS: 0.5000 mg | ORAL_TABLET | Freq: Two times a day (BID) | ORAL | 5 refills | Status: DC

## 2023-04-22 MED ORDER — LAMOTRIGINE 200 MG PO TABS
200.0000 mg | ORAL_TABLET | Freq: Two times a day (BID) | ORAL | 3 refills | Status: DC
Start: 2023-04-22 — End: 2023-10-29

## 2023-04-22 MED ORDER — LEVETIRACETAM 500 MG PO TABS
ORAL_TABLET | ORAL | 3 refills | Status: DC
Start: 2023-04-22 — End: 2023-07-09

## 2023-04-22 MED ORDER — LEVETIRACETAM 1000 MG PO TABS
ORAL_TABLET | ORAL | 3 refills | Status: DC
Start: 2023-04-22 — End: 2023-10-29

## 2023-04-22 NOTE — Progress Notes (Signed)
NEUROLOGY FOLLOW UP OFFICE NOTE  Jamesha Ellsworth [id number] 07/08/1995  HISTORY OF PRESENT ILLNESS: I had the pleasure of seeing Korryn Pancoast in follow-up in the neurology clinic on 04/22/2023.  The patient was last seen 4 months ago for seizures. She is again accompanied by her mother who helps supplement the history today.  Records and images were personally reviewed where available.  She has been regularly seeing Dr. Lolly Mustache for her anxiety and dissociative symptoms. She is visibly much less anxious compared to prior visits, her mother notes it is much better, there is a difference of night and day. She however notes she still gets nervous everywhere she goes. Of note, she has started driving again and denies any anxiety with this. She gets anxious when she goes into a store, anxious she would have a seizure. She has not had any seizures since 03/2022, she is on Levetiracetam 1500mg  BID and Lamotrigine 200mg  BID without side effects. She takes clonazepam 0.5mg  BID, she was advised by Dr. Lolly Mustache that she may take it TID but she has only been taking it BID. She continues to note dissociative symptoms. She denies any headaches, dizziness, no falls.   History on Initial Assessment 04/04/2022: This is a 27 year old right-handed woman with a history of ADD, OSA, and seizures, presenting to establish care. I had previously seen her in 2018 however she then went to [facility] in 2019-2022 for further epilepsy management. She was started on Lamotrigine in 2019. She was seizure-free from 2018 to 08/2020 when she called about a blackout episode with no body jerking, then called to report more seizures with grand mal and staring episodes, Lamotrigine increased to 100-200mg  in 11/2020. Levetiracetam increased to 1000mg  BID in 12/2020. She then saw epileptologist Dr. Teresa Coombs in 04/2021. They were reporting mild body tremors, inability to respond to verbal stimuli. Routine and Ambulatory EEG were normal in  06/2021. She was admitted to the EMU from February 20-24, 2023 where baseline EEG showed generalized spikes and polyspikes with right frontal predominance, predominantly seen during sleep. Seizure medications were discontinued and 2 events were captured. The first seizure was on 2/21, she was initially off camera for the first minute, then she was seen spinning in counterclockwise direction, left arm elevated with subtle jerking, then fell. Left arm and leg were extended, right arm flexed and adductor, leg flexed and abducted followed by figure-of-four posturing with left arm extension and right arm flexion. Onset showed generalized and maximal right frontal spike followed by 6-7 Hz theta activity which appeared generalized and maximal in right hemisphere. The second seizure was on 2/23, after about 15 seconds of EEG seizure, she had left upper extremity flexed, elevated, and abducted with patient turning to the left and forced left gaze deviation. She was holding something in the right arm which was flexed and adducted, then she had figure-of-four posturing with left upper extremity extension and right upper extremity flexion, followed by convulsion. EEG onset showed similar changes to prior seizure. 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 She was discharged home on Levetiracetam 1500mg  BID, Lamotrigine 200mg  BID, and Clobazam 10mg  daily was added to her regimen. On her follow-up with Dr. Teresa Coombs in 11/2021, she continued to report out of body phenomenon and feeling like she would have a seizure, as well as lethargy/fatigue on Clobazam. Symptoms were felt due to anxiety as she is under a lot of stress,  and clonazepam 0.5mg  BID was started. She did well on the clonazepam but when she ran out, she was started on Hydroxyzine, which made her feel weird. She was prescribed Lacosamide but did not start it. She felt being off  clonazepam led to increase in seizure-like activity. She was advised to follow-up with Psychiatry but has not been able to. She saw her PCP in [place]. [name] who "agreed with me that Fluoxetine is not working," she patient self-discontinued it a month ago. She states the clonazepam really helps calm her anxiety, when she dose not take it, anxiety is almost uncontrollable. She states that she has "very, very, very bad anxiety," and has not felt like herself in a very long time. She feels like she is an alien in her body. She is scared to go out of the house, which is new since February.   Her mother reports that the episodes where she would get nauseated, crunch down and shake (not violently) had stopped since February, however her convulsive seizures have recurred where she is foaming at the mouth and "breathing bad." She states her left hand would stiffen up, then she wakes up on the floor. She states this is new and started after her EMU admission. Her aunt recalls a seizure at the beach last May, she was sitting on the couch then it looked like she was grabbing something on her left side with left arm extended, turned in a circle, and fell out. She vomited after and had a horrible headache. Last seizure was 2 weeks ago, she was getting something out of the dryer then her left hand stiffened up and she woke up on the floor. Her grandmother reported she was foaming at the mouth, eyes rolled back, seizure lasted 5 minutes. She feels nauseated and weak after, no focal weakness. She typically has headaches after a seizure. Yesterday she had a "sick headache" with nausea, no vomiting and wears sunglasses in the office today.  She has a contentious relationship with her mother. She was previously living in her own apartment, alternating between her apartment and father's house. Her father was recently diagnosed with a medical condition and she moved back in with her mother. She feels her mother wants to control  everything. Her mother and aunt expressed concern that about living alone and making decisions to live on her own, stating she has had troubles in her life processing issues, needing reminders to brush her teeth for instance. Prior records from Dr. Sharene Skeans indicated she had psychoeducational testing that revealed an IQ of 95. Genetic testing was normal. She lost 2 jobs and is on disability.  Epilepsy Risk Factors:  She had a febrile seizure at age 61. Her mother reports there was a "knot in the umbilical cord" and meconium in amniotic fluid, she needed antibiotics for a few days. Otherwise she had a normal early development.  There is no history of CNS infections such as meningitis/encephalitis, significant traumatic brain injury, neurosurgical procedures, or family history of seizures.   Prior ASMs: Levetiracetam, Zonisamide, Topiramate, clobazam (lethargy and fatigue)  Diagnostic Data: Brain MRI with and without contrast in 2018 was normal. Brain MRI with and without contrast done 05/14/2022 was normal, hippocampi symmetric with no abnormal signal or enhancement seen.  EEG in 2019 was abnormal due to the presence of occasional generalized irregular high voltage 4-5 Hz spike and polyspike and wave discharges, some with right-sided predominance.  Routine EEG in 06/2021 was normal.  Ambulatory EEG in 06/2021 was normal,  one event of seeing flashes of light when closing eyes with no changes in the EEG background. A normal EEG does not exclude a diagnosis of epilepsy.  EMU admission 08/21/21 to 08/25/21 captured 2 events which were consistent with seizures, with generalized onset.  However, focal epilepsy from right frontal region could also have similar appearance.  Unfortunately, we were unable to capture the new events patient has had during which patient does not feel well, comes to her mother and then has full body shaking.  It is possible that patient has coexisting nonepileptic events.     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 (Patient taking differently: Take 0.5 mg by mouth 3 (three) times daily as needed.) 60 tablet 5   lamoTRIgine (LAMICTAL) 200 MG tablet Take 1 tablet (200 mg total) by mouth 2 (two) times daily. 180 tablet 3   levETIRAcetam (KEPPRA) 1000 MG tablet Take 1 tablet twice a day (Take with 500 mg tablet for a total of 1500 mg twice a day) 180 tablet 3   levETIRAcetam (KEPPRA) 500 MG tablet Take 1 tablet twice a day (Take with 1000mg  tablet for a total of 1500mg  twice a day). 180 tablet 3   metroNIDAZOLE (FLAGYL) 500 MG tablet Take 500 mg by mouth 2 (two) times daily. (Patient not taking: Reported on 01/17/2023)     OLANZapine (ZYPREXA) 5 MG tablet Take 1 tablet (5 mg total) by mouth at bedtime. 30 tablet 2   No current facility-administered medications on file prior to visit.    ALLERGIES: Allergies  Allergen Reactions   Penicillins Rash   Cinnamon Swelling   Amoxicillin Rash    FAMILY HISTORY: Family History  Problem Relation Age of Onset   Depression Mother    Cancer Father        Leukemia   Schizophrenia Sister    Drug abuse Brother     Suicidality Maternal Grandmother    Hyperlipidemia Maternal Grandfather    Diabetes Paternal Grandfather     SOCIAL HISTORY: Social History   Socioeconomic History   Marital status: Single    Spouse name: Not on file   Number of children: Not on file   Years of education: 12   Highest education level: High school graduate  Occupational History   Occupation: Unemployed  Tobacco Use   Smoking status: Former    Types: Cigarettes   Smokeless tobacco: Never  Vaping Use   Vaping status: Never Used  Substance and Sexual Activity   Alcohol use: No   Drug use: Not Currently    Types: Marijuana   Sexual activity: Never  Other Topics Concern   Not on file  Social History Narrative   Left handed    Social Determinants of Health   Financial Resource Strain: Not  on file  Food Insecurity: Not on file  Transportation Needs: Not on file  Physical Activity: Not on file  Stress: Stress Concern Present (03/05/2023)   [employer] of Occupational Health - Occupational Stress Questionnaire    Feeling of Stress : Rather much  Social Connections: Unknown (11/13/2021)   [facility]   Social Network    Social Network: Not on file  Intimate Partner Violence: Unknown (10/05/2021)   Received from [facility]   HITS    Physically Hurt: Not on file    Insult or Talk Down To: Not on file    Threaten Physical Harm: Not on file    Scream or Curse: Not on file     PHYSICAL EXAM: Vitals:   04/22/23 1610  BP: 117/81  Pulse: (!) 104  Resp: 20  SpO2: 97%   General: No acute distress, wearing sunglasses indoors Head:  Normocephalic/atraumatic Skin/Extremities: No rash, no edema Neurological Exam: alert and awake. No aphasia or dysarthria. Fund of knowledge is appropriate. Attention and concentration are normal.   Cranial nerves: Pupils equal, round. Extraocular movements intact with no nystagmus. Visual fields full.  No facial asymmetry.   Motor: Bulk and tone normal, muscle strength 5/5 throughout with no pronator drift.   Finger to nose testing intact.  Gait narrow-based and steady, able to tandem walk adequately.  Romberg negative.   IMPRESSION: This is a 27 yo RH woman with a history of ADD, OSA, anxiety, depression, and seizures. She has had seizures since childhood with generalized convulsions and myoclonic jerks. During her EMU admission in February 2023, she had 2 convulsive seizures with focal clinical features, EEG pattern could be seen with generalized epilepsy however focal epilepsy from the right frontal region could also have similar appearance. MRI brain normal. She has not had any seizures since 03/2022 on Levetiracetam 1500mg  BID and Lamotrigine 200mg  BID. She in on clonazepam 0.5mg  BID for anxiety. She has made great strides with her anxiety, much visibly less anxious in the office compared to prior visits. She still has anxiety about going into stores, advised to establish care with a therapist in addition to Psychiatry. She is aware of [place] driving laws to stop driving after a seizure until 6 months seizure-free. Follow-up in 6 months, call for any changes.   Thank you for allowing me to participate in her care.  Please do not hesitate to call for any questions or concerns.    Patrcia Dolly, M.D.   CC: Dr. Lolly Mustache

## 2023-04-22 NOTE — Patient Instructions (Signed)
Good to see you doing better. Continue all your medications. Follow-up in 6 months, call for any changes.    Seizure Precautions: 1. If medication has been prescribed for you to prevent seizures, take it exactly as directed.  Do not stop taking the medicine without talking to your doctor first, even if you have not had a seizure in a long time.   2. Avoid activities in which a seizure would cause danger to yourself or to others.  Don't operate dangerous machinery, swim alone, or climb in high or dangerous places, such as on ladders, roofs, or girders.  Do not drive unless your doctor says you may.  3. If you have any warning that you may have a seizure, lay down in a safe place where you can't hurt yourself.    4.  No driving for 6 months from last seizure, as per [facility].   Please refer to the following link on the Epilepsy Foundation of America's website for more information: http://www.epilepsyfoundation.org/answerplace/Social/driving/drivingu.cfm   5.  Maintain good sleep hygiene. Avoid alcohol.  6.  Notify your neurology if you are planning pregnancy or if you become pregnant.  7.  Contact your doctor if you have any problems that may be related to the medicine you are taking.  8.  Call 911 and bring the patient back to the ED if:        A.  The seizure lasts longer than 5 minutes.       B.  The patient doesn't awaken shortly after the seizure  C.  The patient has new problems such as difficulty seeing, speaking or moving  D.  The patient was injured during the seizure  E.  The patient has a temperature over 102 F (39C)  F.  The patient vomited and now is having trouble breathing

## 2023-04-23 ENCOUNTER — Ambulatory Visit: Payer: Self-pay | Admitting: Licensed Clinical Social Worker

## 2023-04-23 NOTE — Patient Instructions (Signed)
Visit Information  Thank you for taking time to visit with me today. Please don't hesitate to contact me if I can be of assistance to you.   Following are the goals we discussed today:   Goals Addressed             This Visit's Progress    patient has anxiety and stress issues. She takes prescribed Zyprexa.  She has difficulty sleeping alone       Interventions Spoke with [name], mother of client, via phone today about client needs Client has prescribed medications and is taking medications a prescribed [name] reported that client was doing fairly well. Client had not had any recent changes Client sees psychiatrist for mental health support.  Client sees neurologist, Dr. Patrcia Dolly for support [facility] asked LCSW if Kerionna Smilowitz could call LCSW at a later time to further discuss other concerns she has about client. LCSW agreed to plan.  Brena Gavigan to call LCSW at [phone] at a time convenient for [facility] to speak further with LCSW about client needs. LCSW thanked [name] for phone call with LCSW today Later, [name], client , called LCSW. LCSW and Marquesha spoke about her needs Discussed that Kwanita has trouble in crowded spaces, difficulty in noisy areas and difficulty in placed where light is bright. She sometimes wears sun glasses to help manage bright light Discussed support of her mother. Discussed client visits to psychiatrist and medication prescribed by psychiatrist Discussed sleeping challenges.  Discussed pain issues of client. Discussed client support with neurologist, Dr. [name] Discussed client fear of leaving the car sometimes in public settings Encouraged client to call LCSW at [phone] to discus SW needs of client Thanked client for phone call with LCSW today         Aleksis Thrun, mother of client, to call LCSW at [phone] at a time convenient to Addylin Accetta to discuss needs of client  Please call the care guide team at [phone]  if you need to cancel or reschedule your appointment.   If you are experiencing a Mental Health or Behavioral Health Crisis or need someone to talk to, please go to [facility] [address] [place] [phone])   The patient verbalized understanding of instructions, educational materials, and care plan provided today and DECLINED offer to receive copy of patient instructions, educational materials, and care plan.   The patient has been provided with contact information for the care management team and has been advised to call with any health related questions or concerns.   Kelton Pillar.Lauriel Helin MSW, LCSW Licensed Visual merchandiser [facility] Care Management [phone]

## 2023-04-23 NOTE — Patient Outreach (Signed)
  Care Coordination   Follow Up Visit Note   04/23/2023 Name: Maria Day MRN: [id number] DOB: [birth year]  Maria Day is a 27 y.o. year old female who sees Pcp, No for primary care. I spoke with  [name] / Maria Day, mother of client, via phone today.  What matters to the patients health and wellness today?  patient has anxiety and stress issues. She takes prescribed Zyprexa.  She has difficulty sleeping alone    Goals Addressed             This Visit's Progress    patient has anxiety and stress issues. She takes prescribed Zyprexa.  She has difficulty sleeping alone       Interventions Spoke with Maria Day, mother of client, via phone today about client needs Client has prescribed medications and is taking medications a prescribed [name] reported that client was doing fairly well. Client had not had any recent changes Client sees psychiatrist for mental health support.  Client sees neurologist, Dr. Patrcia Dolly for support [facility] asked LCSW if Maria Day could call LCSW at a later time to further discuss other concerns she has about client. LCSW agreed to plan.  [name] to call LCSW at [phone] at a time convenient for [facility] to speak further with LCSW about client needs. LCSW thanked Maria Day for phone call with LCSW today Later, [name], client , called LCSW. LCSW and Aleydis spoke about her needs Discussed that [name] has trouble in crowded spaces, difficulty in noisy areas and difficulty in placed where light is bright. She sometimes wears sun glasses to help manage bright light Discussed support of her mother. Discussed client visits to psychiatrist and medication prescribed by psychiatrist Discussed sleeping challenges.  Discussed pain issues of client. Discussed client support with neurologist, Dr. [name] Discussed client Day of leaving the car sometimes in public settings Encouraged client to call LCSW at [phone] to discus SW  needs of client Thanked client for phone call with LCSW today          SDOH assessments and interventions completed:  Yes  SDOH Interventions Today    Flowsheet Row Most Recent Value  SDOH Interventions   Depression Interventions/Treatment  Currently on Treatment  Stress Interventions Other (Comment)  [client has anxiety and stress issues. she receives mental health support from a psychiatrist]        Care Coordination Interventions:  Yes, provided   Interventions Today    Flowsheet Row Most Recent Value  Chronic Disease   Chronic disease during today's visit Other  [spoke with Maria Day, mother of client, about client needs]  General Interventions   General Interventions Discussed/Reviewed General Interventions Discussed, Community Resources  Education Interventions   Education Provided Provided Education  Provided Verbal Education On [employer]  Mental Health Interventions   Mental Health Discussed/Reviewed Coping Strategies  [client has anxiety and stress issues. she receives mental health support from a psychiatrist]  Pharmacy Interventions   Pharmacy Dicussed/Reviewed Pharmacy Topics Discussed  Safety Interventions   Safety Discussed/Reviewed Fall Risk        Follow up plan: [name], mother of client, to call LCSW at [phone] at a time convenient to Maria Day to further discuss the current needs of client   Encounter Outcome:  Patient Visit Completed   Kelton Pillar.[name] MSW, LCSW Licensed Visual merchandiser [facility] Care Management [phone]

## 2023-05-08 ENCOUNTER — Telehealth: Payer: Self-pay | Admitting: Neurology

## 2023-05-08 NOTE — Telephone Encounter (Signed)
Left message with the after hour service and I called her back to see how we could help her and had to lmom

## 2023-05-08 NOTE — Telephone Encounter (Signed)
Pt called no answer left a voice mail to call back  

## 2023-05-08 NOTE — Telephone Encounter (Signed)
Left message with the after hour service on 05-08-23   Caller states that she nees to speak to soemone about klonopin  She takes klonopin0.5 BID she has very bad anxiety and seizures   She took a dose this morning at 8:30 and wants to know if she can take it again at 5:30

## 2023-05-09 NOTE — Telephone Encounter (Signed)
Pt called no answer left a voice mail to call back  

## 2023-05-17 ENCOUNTER — Encounter: Payer: Self-pay | Admitting: Neurology

## 2023-05-19 ENCOUNTER — Encounter: Payer: Self-pay | Admitting: Neurology

## 2023-05-27 ENCOUNTER — Other Ambulatory Visit (HOSPITAL_COMMUNITY): Payer: Self-pay | Admitting: Psychiatry

## 2023-05-27 DIAGNOSIS — F819 Developmental disorder of scholastic skills, unspecified: Secondary | ICD-10-CM

## 2023-05-27 DIAGNOSIS — F411 Generalized anxiety disorder: Secondary | ICD-10-CM

## 2023-05-27 DIAGNOSIS — F902 Attention-deficit hyperactivity disorder, combined type: Secondary | ICD-10-CM

## 2023-05-27 DIAGNOSIS — F0630 Mood disorder due to known physiological condition, unspecified: Secondary | ICD-10-CM

## 2023-05-28 ENCOUNTER — Other Ambulatory Visit (HOSPITAL_COMMUNITY): Payer: Self-pay | Admitting: *Deleted

## 2023-05-28 DIAGNOSIS — F411 Generalized anxiety disorder: Secondary | ICD-10-CM

## 2023-05-28 DIAGNOSIS — F902 Attention-deficit hyperactivity disorder, combined type: Secondary | ICD-10-CM

## 2023-05-28 DIAGNOSIS — F819 Developmental disorder of scholastic skills, unspecified: Secondary | ICD-10-CM

## 2023-05-28 DIAGNOSIS — F0630 Mood disorder due to known physiological condition, unspecified: Secondary | ICD-10-CM

## 2023-05-28 MED ORDER — OLANZAPINE 5 MG PO TABS: 5.0000 mg | ORAL_TABLET | Freq: Every day | ORAL | 0 refills | Status: DC

## 2023-06-13 ENCOUNTER — Ambulatory Visit (HOSPITAL_COMMUNITY): Payer: Medicare Other | Admitting: Psychiatry

## 2023-06-20 ENCOUNTER — Ambulatory Visit (HOSPITAL_COMMUNITY): Payer: Medicare Other | Admitting: Psychiatry

## 2023-07-04 ENCOUNTER — Other Ambulatory Visit (HOSPITAL_COMMUNITY): Payer: Self-pay | Admitting: Psychiatry

## 2023-07-04 DIAGNOSIS — F819 Developmental disorder of scholastic skills, unspecified: Secondary | ICD-10-CM

## 2023-07-04 DIAGNOSIS — F902 Attention-deficit hyperactivity disorder, combined type: Secondary | ICD-10-CM

## 2023-07-04 DIAGNOSIS — F0630 Mood disorder due to known physiological condition, unspecified: Secondary | ICD-10-CM

## 2023-07-04 DIAGNOSIS — F411 Generalized anxiety disorder: Secondary | ICD-10-CM

## 2023-07-09 ENCOUNTER — Other Ambulatory Visit (HOSPITAL_COMMUNITY): Payer: Self-pay | Admitting: *Deleted

## 2023-07-09 ENCOUNTER — Other Ambulatory Visit: Payer: Self-pay | Admitting: Neurology

## 2023-07-09 DIAGNOSIS — F819 Developmental disorder of scholastic skills, unspecified: Secondary | ICD-10-CM

## 2023-07-09 DIAGNOSIS — F411 Generalized anxiety disorder: Secondary | ICD-10-CM

## 2023-07-09 DIAGNOSIS — F0630 Mood disorder due to known physiological condition, unspecified: Secondary | ICD-10-CM

## 2023-07-09 DIAGNOSIS — F902 Attention-deficit hyperactivity disorder, combined type: Secondary | ICD-10-CM

## 2023-07-09 DIAGNOSIS — G40309 Generalized idiopathic epilepsy and epileptic syndromes, not intractable, without status epilepticus: Secondary | ICD-10-CM

## 2023-07-09 MED ORDER — OLANZAPINE 5 MG PO TABS: 5.0000 mg | ORAL_TABLET | Freq: Every day | ORAL | 0 refills | Status: DC

## 2023-07-18 ENCOUNTER — Encounter: Payer: Self-pay | Admitting: Neurology

## 2023-07-18 ENCOUNTER — Telehealth: Payer: Self-pay | Admitting: Neurology

## 2023-07-18 NOTE — Telephone Encounter (Signed)
Patient sent MyChart message on taking Rx, would like call back.

## 2023-07-18 NOTE — Telephone Encounter (Signed)
Pt called informed that Dr [name] is out of the office but she has been sent her mychart message and that she will reply to it, pt stated that she does not think that she took her medication twice but just incase she wants to know what she needs to do,

## 2023-07-19 NOTE — Telephone Encounter (Signed)
Replied on MyChart

## 2023-07-29 ENCOUNTER — Telehealth (HOSPITAL_COMMUNITY): Payer: Medicare Other | Admitting: Psychiatry

## 2023-07-30 ENCOUNTER — Other Ambulatory Visit (HOSPITAL_COMMUNITY): Payer: Self-pay | Admitting: *Deleted

## 2023-07-30 ENCOUNTER — Telehealth (HOSPITAL_COMMUNITY): Payer: Self-pay | Admitting: Psychiatry

## 2023-07-30 ENCOUNTER — Encounter (HOSPITAL_COMMUNITY): Payer: Self-pay | Admitting: Psychiatry

## 2023-07-30 ENCOUNTER — Telehealth (HOSPITAL_BASED_OUTPATIENT_CLINIC_OR_DEPARTMENT_OTHER): Payer: Medicare Other | Admitting: Psychiatry

## 2023-07-30 DIAGNOSIS — F0630 Mood disorder due to known physiological condition, unspecified: Secondary | ICD-10-CM | POA: Diagnosis not present

## 2023-07-30 DIAGNOSIS — F411 Generalized anxiety disorder: Secondary | ICD-10-CM | POA: Diagnosis not present

## 2023-07-30 DIAGNOSIS — F902 Attention-deficit hyperactivity disorder, combined type: Secondary | ICD-10-CM

## 2023-07-30 DIAGNOSIS — G40309 Generalized idiopathic epilepsy and epileptic syndromes, not intractable, without status epilepticus: Secondary | ICD-10-CM | POA: Diagnosis not present

## 2023-07-30 DIAGNOSIS — Z79899 Other long term (current) drug therapy: Secondary | ICD-10-CM

## 2023-07-30 DIAGNOSIS — F819 Developmental disorder of scholastic skills, unspecified: Secondary | ICD-10-CM | POA: Diagnosis not present

## 2023-07-30 MED ORDER — OLANZAPINE 5 MG PO TABS
5.0000 mg | ORAL_TABLET | Freq: Every day | ORAL | 2 refills | Status: DC
Start: 2023-07-30 — End: 2023-11-06

## 2023-07-30 NOTE — Progress Notes (Signed)
[facility] MD Virtual Progress Note   Patient Location: Grandma mother house Provider Location: Home Office  I connect with patient by video and verified that I am speaking with correct person by using two identifiers. I discussed the limitations of evaluation and management by telemedicine and the availability of in person appointments. I also discussed with the patient that there may be a patient responsible charge related to this service. The patient expressed understanding and agreed to proceed.  Maria Day [id number] 28 y.o.  07/30/2023 11:06 AM  History of Present Illness:  Patient is evaluated by video session.  She is at her grandmother's house since December.  Patient told her 10 year old grandmother is not doing very well and her blood pressure was high and she is decided to stay with her.  Her grandmother was also present during the session.  Patient told that olanzapine is working very well for her.  She denies any irritability, anger, mania, psychosis and her mood is stable however she struggled with anxiety.  She also reported excessive weight gain because she is eating impulsively.  Her grandmother concerned that patient does not leave the house and is still sleeps with her grandmother because she is scared and fearful by herself.  Grandmother is concerned that she wants her granddaughter to be more independent.  Patient has given the diagnosis of ADHD and learning disability.  In the past she has taken the Adderall and Concerta.  She is taking seizure medicine and she is pleased that no seizures for a while.  She is seeing a neurologist.  She has no primary care and had no blood work recently.  She also not seeing any therapist.  She admitted having headaches but no other major health concern.  She has no tremor or shakes or any EPS.  She has no rash.  She denies drinking or using any illegal substances.  She used to see Mr. Durwin Nora but has not seen in a while.  She  sleeps okay however does not sleep by herself.  She does not go outside and stays home as she gets very nervous and anxious.  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 Had psychological testing with IQ around 52.  H/O struggle with grades in school but with help of VR finished high school with good grades. Took Concerta for ADD and Risperdal for mood symptoms.  She was given BuSpar and Geodon which was discontinued after ineffective response.  No history of suicidal attempt, inpatient or abuse.    Outpatient Encounter Medications as of 07/30/2023  Medication Sig   clonazePAM (KLONOPIN) 0.5 MG tablet Take 1 tablet (0.5 mg total) by mouth 2 (two) times daily.   lamoTRIgine (LAMICTAL) 200 MG tablet Take 1 tablet (200 mg total) by mouth 2 (two) times daily.   levETIRAcetam (KEPPRA) 1000 MG tablet Take 1 tablet twice a day (Take with 500 mg tablet for a total of 1500 mg twice a day)   levETIRAcetam (KEPPRA) 500 MG tablet TAKE 1 TABLET BY MOUTH TWICE DAILY (TAKE WITH 1000MG  TO TOTAL 1500MG  TWICE DAILY )   metroNIDAZOLE (FLAGYL) 500 MG tablet Take 500 mg by mouth 2 (two) times daily. (Patient not taking: Reported on 01/17/2023)   OLANZapine (ZYPREXA) 5 MG tablet Take 1 tablet (5 mg total) by mouth at bedtime.   No facility-administered encounter medications on file as of 07/30/2023.    No results found for this or any previous visit (from the past 2160 hours).  Psychiatric Specialty Exam: Physical Exam  Review of Systems  Constitutional:  Positive for appetite change.    Weight 194 lb (88 kg).There is no height or weight on file to calculate BMI.  General Appearance: Casual and pleasant   Eye Contact:  Fair  Speech:  Slow  Volume:  Decreased  Mood:  Anxious  Affect:  Appropriate  Thought Process:  Descriptions of Associations: Intact  Orientation:  Full (Time, Place, and Person)  Thought Content:  Rumination  Suicidal Thoughts:  No  Homicidal Thoughts:   No  Memory:  Immediate;   Fair Recent;   Fair Remote;   Fair  Judgement:  Fair  Insight:  Shallow  Psychomotor Activity:  Increased  Concentration:  Concentration: Fair and Attention Span: Fair  Recall:  [employer] of Knowledge:  Fair  Language:  Fair  Akathisia:  No  Handed:  Right  AIMS (if indicated):     Assets:  Communication Skills Desire for Improvement Housing  ADL's:  Intact  Cognition:  WNL  Sleep:  ok     Assessment/Plan: Generalized idiopathic epilepsy and epileptic syndromes, not intractable, without status epilepticus (HCC)  Mood disorder in conditions classified elsewhere - Plan: OLANZapine (ZYPREXA) 5 MG tablet  Attention deficit hyperactivity disorder, combined type - Plan: OLANZapine (ZYPREXA) 5 MG tablet  GAD (generalized anxiety disorder) - Plan: OLANZapine (ZYPREXA) 5 MG tablet  Learning disorder - Plan: OLANZapine (ZYPREXA) 5 MG tablet  I discussed concerns about weight gain, anxiety.  She struggled with attention and focus.  Due to history of seizures I am reluctant to provide any stimulants.  She need to establish care with a primary care for blood work and get a weight loss program.  We talk about seeing a therapist and getting vocational rehab so she can get employment which could be reason to leave the house.  Patient and her grandmother agreed with the plan.  We will try to refer her Landis Gandy which she had seen in the past.  However she was not consistent.  Talk about watching calorie intake, exercise and trying to avoid impulsive eating.  She had gained weight from the past.  I will order CBC, CMP, hemoglobin A1c.  She will also need a referral to primary care.  I will forward my note to her neurologist.  Continue olanzapine 5 mg at bedtime.  Follow-up in 3 months.   Follow Up Instructions:     I discussed the assessment and treatment plan with the patient. The patient was provided an opportunity to ask questions and all were answered. The  patient agreed with the plan and demonstrated an understanding of the instructions.   The patient was advised to call back or seek an in-person evaluation if the symptoms worsen or if the condition fails to improve as anticipated.    Collaboration of Care: Other provider involved in patient's care AEB notes are available in epic to review  Patient/Guardian was advised Release of Information must be obtained prior to any record release in order to collaborate their care with an outside provider. Patient/Guardian was advised if they have not already done so to contact the registration department to sign all necessary forms in order for [place] to release information regarding their care.   Consent: Patient/Guardian gives verbal consent for treatment and assignment of benefits for services provided during this visit. Patient/Guardian expressed understanding and agreed to proceed.     I provided 26 minutes of non face to face time during this  encounter.  Note: This document was prepared by [employer] voice dictation technology and any errors that results from this process are unintentional.    Cleotis Nipper, MD 07/30/2023

## 2023-07-31 ENCOUNTER — Other Ambulatory Visit: Payer: Self-pay | Admitting: Neurology

## 2023-07-31 DIAGNOSIS — G40309 Generalized idiopathic epilepsy and epileptic syndromes, not intractable, without status epilepticus: Secondary | ICD-10-CM

## 2023-08-20 ENCOUNTER — Other Ambulatory Visit: Payer: Self-pay | Admitting: Neurology

## 2023-08-20 DIAGNOSIS — F419 Anxiety disorder, unspecified: Secondary | ICD-10-CM

## 2023-08-20 DIAGNOSIS — G40309 Generalized idiopathic epilepsy and epileptic syndromes, not intractable, without status epilepticus: Secondary | ICD-10-CM

## 2023-08-21 MED ORDER — CLONAZEPAM 0.5 MG PO TABS: 0.5000 mg | ORAL_TABLET | Freq: Two times a day (BID) | ORAL | 5 refills | Status: DC

## 2023-10-17 ENCOUNTER — Encounter (HOSPITAL_COMMUNITY): Payer: Self-pay

## 2023-10-21 ENCOUNTER — Ambulatory Visit: Payer: Medicare Other | Admitting: Neurology

## 2023-10-28 ENCOUNTER — Telehealth (HOSPITAL_COMMUNITY): Payer: Medicare Other | Admitting: Psychiatry

## 2023-10-29 ENCOUNTER — Ambulatory Visit (INDEPENDENT_AMBULATORY_CARE_PROVIDER_SITE_OTHER): Payer: Medicare Other | Admitting: Neurology

## 2023-10-29 ENCOUNTER — Encounter (INDEPENDENT_AMBULATORY_CARE_PROVIDER_SITE_OTHER): Payer: Self-pay

## 2023-10-29 ENCOUNTER — Encounter: Payer: Self-pay | Admitting: Neurology

## 2023-10-29 VITALS — BP 103/74 | HR 99 | Ht 60.0 in | Wt 197.8 lb

## 2023-10-29 DIAGNOSIS — R635 Abnormal weight gain: Secondary | ICD-10-CM | POA: Diagnosis not present

## 2023-10-29 DIAGNOSIS — F419 Anxiety disorder, unspecified: Secondary | ICD-10-CM

## 2023-10-29 DIAGNOSIS — G40309 Generalized idiopathic epilepsy and epileptic syndromes, not intractable, without status epilepticus: Secondary | ICD-10-CM | POA: Diagnosis not present

## 2023-10-29 DIAGNOSIS — G43009 Migraine without aura, not intractable, without status migrainosus: Secondary | ICD-10-CM | POA: Diagnosis not present

## 2023-10-29 MED ORDER — CLONAZEPAM 0.5 MG PO TABS: 0.5000 mg | ORAL_TABLET | Freq: Two times a day (BID) | ORAL | 5 refills | Status: DC

## 2023-10-29 MED ORDER — LAMOTRIGINE 200 MG PO TABS: 200.0000 mg | ORAL_TABLET | Freq: Two times a day (BID) | ORAL | 3 refills | Status: DC

## 2023-10-29 MED ORDER — LEVETIRACETAM 500 MG PO TABS: ORAL_TABLET | ORAL | 3 refills | Status: DC

## 2023-10-29 MED ORDER — LEVETIRACETAM 1000 MG PO TABS: ORAL_TABLET | ORAL | 3 refills | Status: DC

## 2023-10-29 NOTE — Progress Notes (Signed)
 NEUROLOGY FOLLOW UP OFFICE NOTE  Maria Day [id number] May 17, 1996  HISTORY OF PRESENT ILLNESS: I had the pleasure of seeing Maria Day in follow-up in the neurology clinic on 10/29/2023.  The patient was last seen 6 months ago for seizures. She is again accompanied by her mother who helps supplement the history today.  Records and images were personally reviewed where available. She has been doing well from a seizure standpoint, seizure-free since 03/2022 on Levetiracetam  1500mg  BID and Lamotrigine  200mg  BID, no side effects. She reports that psychiatric symptoms are "still the same," she is still not feeling well at times. She takes clonazepam  0.5mg  BID for anxiety. She is concerned about the weight gain since starting Olanzapine  but does endorse it helps her. She has been having a lot of migraines lately, taking Tylenol  twice a week. No nausea/vomiting. She is still very light sensitive. She stays with a lot of heartburn. She is driving. No pregnancy plans.   History on Initial Assessment 04/04/2022: This is a 28 year old right-handed woman with a history of ADD, OSA, and seizures, presenting to establish care. I had previously seen her in 2018 however she then went to [facility] in 2019-2022 for further epilepsy management. She was started on Lamotrigine  in 2019. She was seizure-free from 2018 to 08/2020 when she called about a blackout episode with no body jerking, then called to report more seizures with grand mal and staring episodes, Lamotrigine  increased to 100-200mg  in 11/2020. Levetiracetam  increased to 1000mg  BID in 12/2020. She then saw epileptologist Dr. Samara Crest in 04/2021. They were reporting mild body tremors, inability to respond to verbal stimuli. Routine and Ambulatory EEG were normal in 06/2021. She was admitted to the EMU from February 20-24, 2023 where baseline EEG showed generalized spikes and polyspikes with right frontal predominance, predominantly seen during sleep.  Seizure medications were discontinued and 2 events were captured. The first seizure was on 2/21, she was initially off camera for the first minute, then she was seen spinning in counterclockwise direction, left arm elevated with subtle jerking, then fell. Left arm and leg were extended, right arm flexed and adductor, leg flexed and abducted followed by figure-of-four posturing with left arm extension and right arm flexion. Onset showed generalized and maximal right frontal spike followed by 6-7 Hz theta activity which appeared generalized and maximal in right hemisphere. The second seizure was on 2/23, after about 15 seconds of EEG seizure, she had left upper extremity flexed, elevated, and abducted with patient turning to the left and forced left gaze deviation. She was holding something in the right arm which was flexed and adducted, then she had figure-of-four posturing with left upper extremity extension and right upper extremity flexion, followed by convulsion. EEG onset showed similar changes to prior seizure. 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 She was discharged home on Levetiracetam  1500mg  BID, Lamotrigine  200mg  BID, and Clobazam  10mg  daily was added to her regimen. On her follow-up with Dr. Samara Crest in 11/2021, she continued to report out of body phenomenon and feeling like she would have a seizure, as well as lethargy/fatigue on Clobazam . Symptoms were felt due to anxiety as she is under a lot of stress, and clonazepam  0.5mg  BID was started. She did well on the clonazepam  but when she ran out, she was started on Hydroxyzine , which made her feel weird. She was prescribed Lacosamide  but did not start it. She felt  being off clonazepam  led to increase in seizure-like activity. She was advised to follow-up with Psychiatry but has not been able to. She saw her PCP in [place]. [name] who "agreed with me that Fluoxetine  is  not working," she patient self-discontinued it a month ago. She states the clonazepam  really helps calm her anxiety, when she dose not take it, anxiety is almost uncontrollable. She states that she has "very, very, very bad anxiety," and has not felt like herself in a very long time. She feels like she is an alien in her body. She is scared to go out of the house, which is new since February.   Her mother reports that the episodes where she would get nauseated, crunch down and shake (not violently) had stopped since February, however her convulsive seizures have recurred where she is foaming at the mouth and "breathing bad." She states her left hand would stiffen up, then she wakes up on the floor. She states this is new and started after her EMU admission. Her aunt recalls a seizure at the beach last May, she was sitting on the couch then it looked like she was grabbing something on her left side with left arm extended, turned in a circle, and fell out. She vomited after and had a horrible headache. Last seizure was 2 weeks ago, she was getting something out of the dryer then her left hand stiffened up and she woke up on the floor. Her grandmother reported she was foaming at the mouth, eyes rolled back, seizure lasted 5 minutes. She feels nauseated and weak after, no focal weakness. She typically has headaches after a seizure. Yesterday she had a "sick headache" with nausea, no vomiting and wears sunglasses in the office today.  She has a contentious relationship with her mother. She was previously living in her own apartment, alternating between her apartment and father's house. Her father was recently diagnosed with a medical condition and she moved back in with her mother. She feels her mother wants to control everything. Her mother and aunt expressed concern that about living alone and making decisions to live on her own, stating she has had troubles in her life processing issues, needing reminders to brush  her teeth for instance. Prior records from Dr. Darlys Eland indicated she had psychoeducational testing that revealed an IQ of 25. Genetic testing was normal. She lost 2 jobs and is on disability.  Epilepsy Risk Factors:  She had a febrile seizure at age 72. Her mother reports there was a "knot in the umbilical cord" and meconium in amniotic fluid, she needed antibiotics for a few days. Otherwise she had a normal early development.  There is no history of CNS infections such as meningitis/encephalitis, significant traumatic brain injury, neurosurgical procedures, or family history of seizures.   Prior ASMs: Levetiracetam , Zonisamide, Topiramate, clobazam  (lethargy and fatigue)  Diagnostic Data: Brain MRI with and without contrast in 2018 was normal. Brain MRI with and without contrast done 05/14/2022 was normal, hippocampi symmetric with no abnormal signal or enhancement seen.  EEG in 2019 was abnormal due to the presence of occasional generalized irregular high voltage 4-5 Hz spike and polyspike and wave discharges, some with right-sided predominance.  Routine EEG in 06/2021 was normal.  Ambulatory EEG in 06/2021 was normal, one event of seeing flashes of light when closing eyes with no changes in the EEG background. A normal EEG does not exclude a diagnosis of epilepsy.  EMU admission 08/21/21 to 08/25/21 captured 2 events which were  consistent with seizures, with generalized onset.  However, focal epilepsy from right frontal region could also have similar appearance.  Unfortunately, we were unable to capture the new events patient has had during which patient does not feel well, comes to her mother and then has full body shaking.  It is possible that patient has coexisting nonepileptic events.    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 60 tablet 5   lamoTRIgine  (LAMICTAL ) 200 MG tablet Take 1 tablet (200 mg total) by mouth 2 (two) times daily. 180 tablet 3   levETIRAcetam  (KEPPRA ) 1000 MG tablet Take 1 tablet twice a day (Take with 500 mg tablet for a total of 1500 mg twice a day) 180 tablet 3   levETIRAcetam  (KEPPRA ) 500 MG tablet TAKE 1 TABLET BY MOUTH TWICE DAILY (TAKE WITH 1000MG  TO TOTAL 1500MG  TWICE DAILY ) 180 tablet 0   OLANZapine  (ZYPREXA ) 5 MG tablet Take 1 tablet (5 mg total) by mouth at bedtime. 30 tablet 2   No current facility-administered medications on file prior to visit.    ALLERGIES: Allergies  Allergen Reactions   Penicillins Rash   Cinnamon Swelling   Amoxicillin Rash    FAMILY HISTORY: Family History  Problem Relation Age of Onset   Depression Mother    Cancer Father        Leukemia   Schizophrenia Sister    Drug abuse Brother    Suicidality Maternal Grandmother    Hyperlipidemia Maternal Grandfather    Diabetes Paternal Grandfather     SOCIAL HISTORY: Social History   Socioeconomic History   Marital status: Single    Spouse name: Not on file   Number of children: Not on file   Years of education: 12   Highest education level: High school graduate  Occupational History   Occupation: Unemployed  Tobacco Use   Smoking status: Former     Types: Cigarettes   Smokeless tobacco: Never  Vaping Use   Vaping status: Never Used  Substance and Sexual Activity   Alcohol use: No   Drug use: Not Currently    Types: Marijuana   Sexual activity: Never  Other Topics Concern   Not on file  Social History Narrative   Left handed    Social Drivers of Health   Financial Resource Strain: Not on file  Food Insecurity: Not on file  Transportation Needs: Not on file  Physical Activity: Not on file  Stress: Stress Concern Present (04/23/2023)   [employer] of Occupational Health - Occupational Stress Questionnaire    Feeling of Stress : Rather much  Social Connections: Unknown (11/13/2021)   Received from [facility], [facility]   Social Network    Social Network: Not on file  Intimate Partner Violence: Unknown (10/05/2021)  Received from [facility], [facility]   HITS    Physically Hurt: Not on file    Insult or Talk Down To: Not on file    Threaten Physical Harm: Not on file    Scream or Curse: Not on file     PHYSICAL EXAM: Vitals:   10/29/23 1513  BP: 103/74  Pulse: 99  SpO2: 96%   General: No acute distress Head:  Normocephalic/atraumatic Skin/Extremities: No rash, no edema Neurological Exam: alert and awake. No aphasia or dysarthria. Fund of knowledge is appropriate.  Attention and concentration are normal.   Cranial nerves: Pupils equal, round. Extraocular movements intact with no nystagmus. Visual fields full.  No facial asymmetry.  Motor: Bulk and tone normal, muscle strength 5/5 throughout with no pronator drift.   Finger to nose testing intact.  Gait narrow-based and steady, able to tandem walk adequately.  Romberg negative.   IMPRESSION: This is a 28 yo RH woman with a history of ADD, OSA, anxiety, depression, and seizures. She has had seizures since childhood with generalized convulsions and myoclonic jerks. During her EMU admission in February 2023, she had 2 convulsive seizures with focal  clinical features, EEG pattern could be seen with generalized epilepsy however focal epilepsy from the right frontal region could also have similar appearance. MRI brain normal. She has been seizure-free since 03/2022 on Levetiracetam  1500mg  BID and Lamotrigine  200mg  BID. She has prn clonazepam  for anxiety and has had significant improvement in anxiety compared to initial visits. Continue follow-up with Psychiatry, encouraged to continue finding a therapist. We discussed the headaches and minimizing rescue medication to 2-3 a week to avoid rebound headaches. She is concerned about weight gain with Olanzapine  and will be referred to the [facility]. She is aware of [place] driving laws to stop driving after a seizure until 6 months seizure-free. Follow-up in 6 months, call for any changes.    Thank you for allowing me to participate in her care.  Please do not hesitate to call for any questions or concerns.    Rayfield Cairo, M.D.   CC: Dr. Arfeen

## 2023-10-29 NOTE — Patient Instructions (Signed)
 Good to see you. Continue all your medications.   Referral will be sent to [facility].  Follow-up in 6 months, call for any changes.   Seizure Precautions: 1. If medication has been prescribed for you to prevent seizures, take it exactly as directed.  Do not stop taking the medicine without talking to your doctor first, even if you have not had a seizure in a long time.   2. Avoid activities in which a seizure would cause danger to yourself or to others.  Don't operate dangerous machinery, swim alone, or climb in high or dangerous places, such as on ladders, roofs, or girders.  Do not drive unless your doctor says you may.  3. If you have any warning that you may have a seizure, lay down in a safe place where you can't hurt yourself.    4.  No driving for 6 months from last seizure, as per [place]  state law.   Please refer to the following link on the Epilepsy Foundation of America's website for more information: http://www.epilepsyfoundation.org/answerplace/Social/driving/drivingu.cfm   5.  Maintain good sleep hygiene. Avoid alcohol.  6.  Notify your neurology if you are planning pregnancy or if you become pregnant.  7.  Contact your doctor if you have any problems that may be related to the medicine you are taking.  8.  Call 911 and bring the patient back to the ED if:        A.  The seizure lasts longer than 5 minutes.       B.  The patient doesn't awaken shortly after the seizure  C.  The patient has new problems such as difficulty seeing, speaking or moving  D.  The patient was injured during the seizure  E.  The patient has a temperature over 102 F (39C)  F.  The patient vomited and now is having trouble breathing

## 2023-11-02 ENCOUNTER — Other Ambulatory Visit (HOSPITAL_COMMUNITY): Payer: Self-pay | Admitting: Psychiatry

## 2023-11-02 DIAGNOSIS — F819 Developmental disorder of scholastic skills, unspecified: Secondary | ICD-10-CM

## 2023-11-02 DIAGNOSIS — F411 Generalized anxiety disorder: Secondary | ICD-10-CM

## 2023-11-02 DIAGNOSIS — F902 Attention-deficit hyperactivity disorder, combined type: Secondary | ICD-10-CM

## 2023-11-02 DIAGNOSIS — F0630 Mood disorder due to known physiological condition, unspecified: Secondary | ICD-10-CM

## 2023-11-04 DIAGNOSIS — Z0289 Encounter for other administrative examinations: Secondary | ICD-10-CM

## 2023-11-05 ENCOUNTER — Encounter: Payer: Self-pay | Admitting: Bariatrics

## 2023-11-05 ENCOUNTER — Ambulatory Visit (INDEPENDENT_AMBULATORY_CARE_PROVIDER_SITE_OTHER): Admitting: Bariatrics

## 2023-11-05 VITALS — BP 108/76 | HR 85 | Temp 98.0°F | Ht 60.0 in | Wt 193.0 lb

## 2023-11-05 DIAGNOSIS — Z6837 Body mass index (BMI) 37.0-37.9, adult: Secondary | ICD-10-CM

## 2023-11-05 DIAGNOSIS — E7800 Pure hypercholesterolemia, unspecified: Secondary | ICD-10-CM | POA: Diagnosis not present

## 2023-11-05 DIAGNOSIS — E669 Obesity, unspecified: Secondary | ICD-10-CM

## 2023-11-05 DIAGNOSIS — G40309 Generalized idiopathic epilepsy and epileptic syndromes, not intractable, without status epilepticus: Secondary | ICD-10-CM | POA: Diagnosis not present

## 2023-11-05 DIAGNOSIS — G4733 Obstructive sleep apnea (adult) (pediatric): Secondary | ICD-10-CM | POA: Diagnosis not present

## 2023-11-05 DIAGNOSIS — E66812 Obesity, class 2: Secondary | ICD-10-CM

## 2023-11-05 NOTE — Progress Notes (Signed)
 Office: [phone]  /  Fax: [phone]   Initial Visit  Maria Day was seen in clinic today to evaluate for obesity. She is interested in losing weight to improve overall health and reduce the risk of weight related complications. She presents today to review program treatment options, initial physical assessment, and evaluation.     She was referred by: Specialist (neurologist)   When asked what else they would like to accomplish? She states: Adopt healthier eating patterns, Improve energy levels and physical activity, Improve existing medical conditions, and Improve quality of life  When asked how has your weight affected you? She states: Having fatigue and Having poor endurance  Some associated conditions: Other: seizures and elevated cholesterol   Contributing factors: Family history of obesity, Use of obesogenic medications: Psychotropic medications and Other: anti-seizure, Moderate to high levels of stress, Reduced physical activity, and Slow metabolism for age  Weight promoting medications identified: Psychotropic medications and Other: anti-seizure   Current nutrition plan: None  Current level of physical activity: None and Walking 10 minutes, three a week  Current or previous pharmacotherapy: None  Response to medication: Never tried medications   Past medical history includes:   Past Medical History:  Diagnosis Date   Acanthosis nigricans 11/18/2014   ADHD (attention deficit hyperactivity disorder), combined type 12/08/2013   Generalized anxiety disorder, severe 11/05/2022   Major depressive disorder 11/05/2022   Migraine without aura and without status migrainosus, not intractable 05/31/2017   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  Peak Weight:193 , Body Fat%:42.6 %, Visceral Fat Rating:9, Weight trend over the last 12 months: Increasing  General:  Alert, oriented and cooperative. Patient is in no acute distress.  Respiratory: Normal respiratory effort, no problems with respiration noted  Extremities: Normal range of motion.    Mental Status: Normal mood and affect. Normal behavior. Normal judgment and thought content.   DIAGNOSTIC DATA REVIEWED:  BMET    Component Value Date/Time   NA 139 08/21/2021 1004   NA 142 04/26/2021 0920   K 4.2 08/21/2021 1004   CL 105 08/21/2021 1004   CO2 25 08/21/2021 1004   GLUCOSE 84 08/21/2021 1004   BUN 10 08/21/2021 1004   BUN 12 04/26/2021 0920   CREATININE 0.95 08/21/2021 1004   CALCIUM 9.2 08/21/2021 1004   GFRNONAA >60 08/21/2021 1004   GFRAA >60 10/08/2016 0840   No results found for: "HGBA1C" No results found for: "INSULIN" CBC    Component Value Date/Time   WBC 7.0 08/21/2021 1004   RBC 4.74 08/21/2021 1004   HGB 14.2 08/21/2021 1004   HCT 42.4 08/21/2021 1004   PLT 290 08/21/2021 1004   MCV 89.5 08/21/2021 1004   MCH 30.0 08/21/2021 1004   MCHC 33.5 08/21/2021 1004   RDW 13.3 08/21/2021 1004   Iron/TIBC/Ferritin/ %Sat No results found for: "IRON", "TIBC", "FERRITIN", "IRONPCTSAT" Lipid Panel  No results found for: "CHOL", "TRIG", "HDL", "CHOLHDL", "VLDL", "LDLCALC", "LDLDIRECT" Hepatic Function Panel     Component Value Date/Time  PROT 7.0 08/21/2021 1004   PROT 7.4 04/26/2021 0920   ALBUMIN 3.9 08/21/2021 1004   ALBUMIN 4.7 04/26/2021 0920   AST 17 08/21/2021 1004   ALT 13 08/21/2021 1004   ALKPHOS 64 08/21/2021 1004   BILITOT 0.3 08/21/2021 1004   BILITOT 0.3 04/26/2021 0920      Component Value Date/Time   TSH 3.595 08/21/2021 1530     Assessment and Plan:    Obstructive Sleep Apnea Maria Day has a diagnosis of sleep apnea. She reports that she is not using a CPAP regularly.  She states that she never was able to adjust to the CPAP. She reports unrestful sleep at time.   Plan: Will obtain records regarding her sleep that study that was done at [facility]. Continue to practice good sleep hygiene.  Will add in elements of an antiinflammatory diet and  add in elements of a Mediterranean diet.  Reduce intake of carbohydrates for weight loss.    Elevated cholesterol:  LDL is not at goal per patient.  Medication(s): none Cardiovascular risk factors: obesity (BMI >= 30 kg/m2) and sedentary lifestyle  No results found for: "CHOL", "HDL", "LDLCALC", "LDLDIRECT", "TRIG", "CHOLHDL" Lab Results  Component Value Date   ALT 13 08/21/2021   AST 17 08/21/2021   ALKPHOS 64 08/21/2021   BILITOT 0.3 08/21/2021   The ASCVD Risk score (Arnett DK, et al., 2019) failed to calculate for the following reasons:   The 2019 ASCVD risk score is only valid for ages 28 to 4  Plan:  Will avoid all trans fats.  Will read labels Will minimize saturated fats except the following: low fat meats in moderation, diary, and limited dark chocolate.  Increase Omega 3 in foods, and consider an Omega 3 supplement.    Non-intractable generalized idiopathic epilepsy:   She states that she has had long standing seizures that started at a proximately 28 years of age.  She describes her seizures as generalized seizures.  She states that her last seizure was approximately 2 years ago.  She does have a neurologist she sees periodically.  States that she takes her medications as prescribed.  Plan: Continue medications as prescribed.  Will follow-up with her neurologist.    Generalized Obesity: Current BMI 37.69    Obesity Treatment / Action Plan:  Patient will work on garnering support from family and friends to begin weight loss journey. Will work on eliminating or  reducing the presence of highly palatable, calorie dense foods in the home. Will complete provided nutritional and psychosocial assessment questionnaire before the next appointment. Will be scheduled for indirect calorimetry to determine resting energy expenditure in a fasting state.  This will allow us  to create a reduced calorie, high-protein meal plan to promote loss of fat mass while preserving muscle mass. Counseled on the health benefits of losing 5%-15% of total body weight. Was counseled on nutritional approaches to weight loss and benefits of reducing processed foods and consuming plant-based foods and high quality protein as part of nutritional weight management. Was counseled on pharmacotherapy and role as an adjunct in weight management.   Obesity Education Performed Today:  She was weighed on the bioimpedance scale and results were discussed and documented in the synopsis.  We discussed obesity as a disease and the importance of a more detailed evaluation of all the factors contributing to the disease.  We discussed the importance of long term lifestyle changes which include nutrition, exercise and behavioral modifications as well as the importance of  customizing this to her specific health and social needs.  We discussed the benefits of reaching a healthier weight to alleviate the symptoms of existing conditions and reduce the risks of the biomechanical, metabolic and psychological effects of obesity.  Discussed New Patient/Late Arrival, and Cancellation Policies. Patient voiced understanding and allowed to ask questions.   Persephanie Raju appears to be in the action stage of change and states they are ready to start intensive lifestyle modifications and behavioral modifications.  30 minutes was spent today on this visit including the above counseling, pre-visit chart review, and post-visit documentation.  Reviewed by clinician on day of visit: allergies, medications, problem  list, medical history, surgical history, family history, social history, and previous encounter notes.    Chanon Loney A. Claiborne CrewO.

## 2023-11-06 ENCOUNTER — Other Ambulatory Visit (HOSPITAL_COMMUNITY): Payer: Self-pay

## 2023-11-06 DIAGNOSIS — F0630 Mood disorder due to known physiological condition, unspecified: Secondary | ICD-10-CM

## 2023-11-06 DIAGNOSIS — F902 Attention-deficit hyperactivity disorder, combined type: Secondary | ICD-10-CM

## 2023-11-06 DIAGNOSIS — F819 Developmental disorder of scholastic skills, unspecified: Secondary | ICD-10-CM

## 2023-11-06 DIAGNOSIS — F411 Generalized anxiety disorder: Secondary | ICD-10-CM

## 2023-11-06 MED ORDER — OLANZAPINE 5 MG PO TABS: 5.0000 mg | ORAL_TABLET | Freq: Every day | ORAL | 0 refills | Status: DC

## 2023-11-18 ENCOUNTER — Encounter: Payer: Self-pay | Admitting: Bariatrics

## 2023-11-18 ENCOUNTER — Ambulatory Visit (INDEPENDENT_AMBULATORY_CARE_PROVIDER_SITE_OTHER): Admitting: Bariatrics

## 2023-11-18 VITALS — BP 105/76 | HR 86 | Ht 60.0 in | Wt 193.0 lb

## 2023-11-18 DIAGNOSIS — R5383 Other fatigue: Secondary | ICD-10-CM | POA: Diagnosis not present

## 2023-11-18 DIAGNOSIS — G4733 Obstructive sleep apnea (adult) (pediatric): Secondary | ICD-10-CM | POA: Diagnosis not present

## 2023-11-18 DIAGNOSIS — E559 Vitamin D deficiency, unspecified: Secondary | ICD-10-CM | POA: Diagnosis not present

## 2023-11-18 DIAGNOSIS — Z0000 Encounter for general adult medical examination without abnormal findings: Secondary | ICD-10-CM

## 2023-11-18 DIAGNOSIS — E669 Obesity, unspecified: Secondary | ICD-10-CM

## 2023-11-18 DIAGNOSIS — Z6837 Body mass index (BMI) 37.0-37.9, adult: Secondary | ICD-10-CM | POA: Diagnosis not present

## 2023-11-18 DIAGNOSIS — R7309 Other abnormal glucose: Secondary | ICD-10-CM | POA: Diagnosis not present

## 2023-11-18 DIAGNOSIS — R0602 Shortness of breath: Secondary | ICD-10-CM | POA: Diagnosis not present

## 2023-11-18 DIAGNOSIS — E7800 Pure hypercholesterolemia, unspecified: Secondary | ICD-10-CM

## 2023-11-18 DIAGNOSIS — E66812 Obesity, class 2: Secondary | ICD-10-CM

## 2023-11-18 DIAGNOSIS — Z1331 Encounter for screening for depression: Secondary | ICD-10-CM

## 2023-11-18 NOTE — Progress Notes (Signed)
 At a Glance:  Vitals Temp: 0 F (-17.8 C) (Could not obtain) BP: 105/76 Pulse Rate: 86 SpO2: 93 %   Anthropometric Measurements Height: 5' (1.524 m) Weight: 193 lb (87.5 kg) BMI (Calculated): 37.69 Starting Weight: 193lb Peak Weight: 193lb   Body Composition  Body Fat %: 42.5 % Fat Mass (lbs): 82.2 lbs Muscle Mass (lbs): 105.4 lbs Total Body Water (lbs): 75.6 lbs Visceral Fat Rating : 9   Other Clinical Data RMR: 1872 Fasting: yes Labs: yes Today's Visit #: 1 Starting Date: 11/18/23    EKG: Normal sinus rhythm, rate 90.  Indirect Calorimeter:   Resting Metabolic Rate ( RMR):  RMR (actual): 1872 kcal RMR (calculated): 1596 kcal The calculated basal metabolic rate is 7829 kcal thus her basal metabolic rate is better than expected.  Plan:   Indirect calorimeter completed, interpreted and reviewed with patient today and allowed to ask questions.  Discussed the implications for the chosen plan and exercise based on the RMR reading.  Will consider repeating the RMR in the future based on weight loss.    Chief Complaint:  Obesity   Subjective:  Maria Day (MR# [id number]) is a 28 y.o. female who presents for evaluation and treatment of obesity and related comorbidities.   Maria Day is currently in the action stage of change and ready to dedicate time achieving and maintaining a healthier weight. Maria Day is interested in becoming our patient and working on intensive lifestyle modifications including (but not limited to) diet and exercise for weight loss.  Maria Day has been struggling with her weight. She has been unsuccessful in either losing weight, maintaining weight loss, or reaching her healthy weight goal.  Jasemine's habits were reviewed today and are as follows: Her family eats meals together, she thinks her family will eat healthier with her, and she started gaining weight in 2022 with seizure medications. .  Current or previous pharmacotherapy:  None  Response to medication: Never tried medications  Other Fatigue Maria Day admits to daytime somnolence and admits to waking up still tired. Patient has a history of symptoms of morning headache. Maria Day generally gets 9 -11 or 12 hours of sleep per night, and states that she has generally restful sleep. Snoring is present. Apneic episodes are not present. Epworth Sleepiness Score is 12.   Shortness of Breath Maria Day notes increasing shortness of breath with exercising and seems to be worsening over time with weight gain. She notes getting out of breath sooner with activity than she used to. This has gotten worse recently. Maria Day denies shortness of breath at rest or orthopnea.  Depression Screen Maria Day's Food and Mood (modified PHQ-9) score was 13. 10-14 moderate depression     04/23/2023    2:52 PM  Depression screen PHQ 2/9  Decreased Interest 1  Down, Depressed, Hopeless 1  PHQ - 2 Score 2  Altered sleeping 1  Tired, decreased energy 1  Change in appetite 1  Feeling bad or failure about yourself  1  Trouble concentrating 1  Moving slowly or fidgety/restless 1  Suicidal thoughts 0  PHQ-9 Score 8  Difficult doing work/chores Somewhat difficult     Assessment and Plan:   Other Fatigue Maria Day does feel that her weight is causing her energy to be lower than it should be. Fatigue may be related to obesity, depression or many other causes. Labs will be ordered, and in the meanwhile, Shaquoya will focus on self care including making healthy food choices, increasing physical activity and focusing on stress reduction.  Shortness of Breath Maria Day does feel that she gets out of breath more easily that she used to when she exercises. Maria Day's shortness of breath appears to be obesity related and exercise induced. She has agreed to work on weight loss and gradually increase exercise to treat her exercise induced shortness of breath. Will continue to monitor closely.  Health  Maintenance:   Obesity   Plan: Will do EKG, indirect calorimetry, and labs.     Vitamin D Deficiency She is at risk for vitamin D deficiency due to obesity.  She is on magnesium, no MV.  No results found for: "VD25OH"  Plan: Will check for vitamin D deficiency.   Obstructive Sleep Apnea Maria Day has a diagnosis of sleep apnea. She reports that she is not using a CPAP regularly. Reports restful sleep.   Plan: Continue CPAP therapy. Continue to practice good sleep hygiene.  Will add in elements of an antiinflammatory diet and  add in elements of a Mediterranean diet.  Reduce intake of carbohydrates for weight loss.   Elevated cholesterol:  LDL is not at goal. Medication(s): none Cardiovascular risk factors: obesity (BMI >= 30 kg/m2) and sedentary lifestyle  No results found for: "CHOL", "HDL", "LDLCALC", "LDLDIRECT", "TRIG", "CHOLHDL" Lab Results  Component Value Date   ALT 13 08/21/2021   AST 17 08/21/2021   ALKPHOS 64 08/21/2021   BILITOT 0.3 08/21/2021   The ASCVD Risk score (Arnett DK, et al., 2019) failed to calculate for the following reasons:   The 2019 ASCVD risk score is only valid for ages 29 to 70  Plan:  Will avoid all trans fats.  Will read labels Will minimize saturated fats except the following: low fat meats in moderation, diary, and limited dark chocolate.  Increase Omega 3 in foods, and consider an Omega 3 supplement.   Elevated glucose:   She is at risk for elevated glucose secondary to her obesity.   Plan: Will check her Hemoglobin A1c and insulin levels today.     Maria Day had a positive depression screening. Depression is commonly associated with obesity and often results in emotional eating behaviors. We will monitor this closely and work on CBT to help improve the non-hunger eating patterns. Referral to Psychology may be required if no improvement is seen as she continues in our clinic.   Previous labs reviewed today. Date: No labs on file.    Labs done today CMP, Lipids, Insulin, HgbA1c, Vit D, and Thyroid Panel   Generalized Obesity: BMI (Calculated): 37.69   Maria Day is currently in the action stage of change and her goal is to begin weight loss efforts. I recommend Maria Day begin the structured treatment plan as follows:  She has agreed to Category 2 Plan plus 100 calories.   Exercise goals: All adults should avoid inactivity. Some activity is better than none, and adults who participate in any amount of physical activity, gain some health benefits.  Behavioral modification strategies:increasing lean protein intake, decreasing simple carbohydrates, increasing vegetables, increase H2O intake, no skipping meals, keeping healthy foods in the home, better snacking choices, and planning for success  She was informed of the importance of frequent follow-up visits to maximize her success with intensive lifestyle modifications for her multiple health conditions. She was informed we would discuss her lab results at her next visit unless there is a critical issue that needs to be addressed sooner. Maria Day agreed to keep her next visit at the agreed upon time to discuss these results.  Objective:  General: Cooperative,  alert, well developed, in no acute distress. HEENT: Conjunctivae and lids unremarkable. Cardiovascular: Regular rhythm.  Lungs: Normal work of breathing. Neurologic: No focal deficits.   Lab Results  Component Value Date   CREATININE 0.95 08/21/2021   BUN 10 08/21/2021   NA 139 08/21/2021   K 4.2 08/21/2021   CL 105 08/21/2021   CO2 25 08/21/2021   Lab Results  Component Value Date   ALT 13 08/21/2021   AST 17 08/21/2021   ALKPHOS 64 08/21/2021   BILITOT 0.3 08/21/2021   No results found for: "HGBA1C" No results found for: "INSULIN" Lab Results  Component Value Date   TSH 3.595 08/21/2021   No results found for: "CHOL", "HDL", "LDLCALC", "LDLDIRECT", "TRIG", "CHOLHDL" Lab Results  Component Value  Date   WBC 7.0 08/21/2021   HGB 14.2 08/21/2021   HCT 42.4 08/21/2021   MCV 89.5 08/21/2021   PLT 290 08/21/2021   No results found for: "IRON", "TIBC", "FERRITIN"  Attestation Statements:  Applicable history such as the following:  allergies, medications, problem list, medical history, surgical history, family history, social history, and previous encounter notes reviewed by clinician on day of visit:  Time spent on visit in care of the patient today including the items listed below was 40 minutes.    20 minutes were spent talking about the history, 15 minutes for face to face counseling implementing the plan, discussing the specifics of how to arrange meals, meal planning, water intake.   I spent face to face time discussing his/her plan, including breakfast, additional breakfast options, lunch, and dinner options, grocery list, and snacks.  I reviewed her indirect calorimetry. I discussed the implications for the diet plan.    Discussed the bio-impedence test (fat %, muscle mass, and water weight) and allowed the patient to ask questions.   Discussed the following information sheets: Category 2, Grocery List, 100 Calorie Snacks, 200 Calorie Snacks, and Protein shake sheet. .  We discussed previous labs.   I additionally spent time documenting, reviewing, and checking the codes before submitting.   This may have been prepared with the assistance of Engineer, civil (consulting).  Occasional wrong-word or sound-a-like substitutions may have occurred due to the inherent limitations of voice recognition software.    Kirk Peper, DO

## 2023-11-19 ENCOUNTER — Encounter: Payer: Self-pay | Admitting: Bariatrics

## 2023-11-19 DIAGNOSIS — E785 Hyperlipidemia, unspecified: Secondary | ICD-10-CM | POA: Insufficient documentation

## 2023-11-19 DIAGNOSIS — E559 Vitamin D deficiency, unspecified: Secondary | ICD-10-CM | POA: Insufficient documentation

## 2023-11-19 DIAGNOSIS — E88819 Insulin resistance, unspecified: Secondary | ICD-10-CM | POA: Insufficient documentation

## 2023-11-19 LAB — LIPID PANEL WITH LDL/HDL RATIO
Cholesterol, Total: 274 mg/dL — ABNORMAL HIGH (ref 100–199)
HDL: 41 mg/dL (ref >39)
LDL Chol Calc (NIH): 209 mg/dL — ABNORMAL HIGH (ref 0–99)
LDL/HDL Ratio: 5.1 ratio — ABNORMAL HIGH (ref 0.0–3.2)
Triglycerides: 131 mg/dL (ref 0–149)
VLDL Cholesterol Cal: 24 mg/dL (ref 5–40)

## 2023-11-19 LAB — COMPREHENSIVE METABOLIC PANEL WITH GFR
ALT: 13 IU/L (ref 0–32)
AST: 15 IU/L (ref 0–40)
Albumin: 5 g/dL (ref 4.0–5.0)
Alkaline Phosphatase: 81 IU/L (ref 44–121)
BUN/Creatinine Ratio: 20 (ref 9–23)
BUN: 18 mg/dL (ref 6–20)
Bilirubin Total: 0.2 mg/dL (ref 0.0–1.2)
CO2: 19 mmol/L — ABNORMAL LOW (ref 20–29)
Calcium: 10 mg/dL (ref 8.7–10.2)
Chloride: 101 mmol/L (ref 96–106)
Creatinine, Ser: 0.92 mg/dL (ref 0.57–1.00)
Globulin, Total: 2.5 g/dL (ref 1.5–4.5)
Glucose: 92 mg/dL (ref 70–99)
Potassium: 4.7 mmol/L (ref 3.5–5.2)
Sodium: 137 mmol/L (ref 134–144)
Total Protein: 7.5 g/dL (ref 6.0–8.5)
eGFR: 89 mL/min/{1.73_m2} (ref >59)

## 2023-11-19 LAB — TSH+T4F+T3FREE
Free T4: 1.2 ng/dL (ref 0.82–1.77)
T3, Free: 3.4 pg/mL (ref 2.0–4.4)
TSH: 3.75 u[IU]/mL (ref 0.450–4.500)

## 2023-11-19 LAB — HEMOGLOBIN A1C
Est. average glucose Bld gHb Est-mCnc: 108 mg/dL
Hgb A1c MFr Bld: 5.4 % (ref 4.8–5.6)

## 2023-11-19 LAB — VITAMIN D 25 HYDROXY (VIT D DEFICIENCY, FRACTURES): Vit D, 25-Hydroxy: 16.9 ng/mL — ABNORMAL LOW (ref 30.0–100.0)

## 2023-11-19 LAB — INSULIN, RANDOM: INSULIN: 38.3 u[IU]/mL — ABNORMAL HIGH (ref 2.6–24.9)

## 2023-11-26 ENCOUNTER — Telehealth (HOSPITAL_COMMUNITY): Admitting: Psychiatry

## 2023-11-26 ENCOUNTER — Encounter (HOSPITAL_COMMUNITY): Payer: Self-pay | Admitting: Psychiatry

## 2023-11-26 DIAGNOSIS — F902 Attention-deficit hyperactivity disorder, combined type: Secondary | ICD-10-CM | POA: Diagnosis not present

## 2023-11-26 DIAGNOSIS — F411 Generalized anxiety disorder: Secondary | ICD-10-CM | POA: Diagnosis not present

## 2023-11-26 DIAGNOSIS — F0630 Mood disorder due to known physiological condition, unspecified: Secondary | ICD-10-CM

## 2023-11-26 DIAGNOSIS — F819 Developmental disorder of scholastic skills, unspecified: Secondary | ICD-10-CM

## 2023-11-26 MED ORDER — OLANZAPINE 5 MG PO TABS: 5.0000 mg | ORAL_TABLET | Freq: Every day | ORAL | 2 refills | Status: DC

## 2023-11-26 NOTE — Progress Notes (Signed)
 [facility] MD Virtual Progress Note   Patient Location: At mother`s home Provider Location: Home Office  I connect with patient by video and verified that I am speaking with correct person by using two identifiers. I discussed the limitations of evaluation and management by telemedicine and the availability of in person appointments. I also discussed with the patient that there may be a patient responsible charge related to this service. The patient expressed understanding and agreed to proceed.  Maria Day [id number] 28 y.o.  11/26/2023 11:45 AM  History of Present Illness:  Patient is evaluated by video session.  She was last seen in January.  She reported things are going well and she is sleeping better.  She denies any panic attack but is still sometimes she feels that she is not in her own body.  However she denies any hallucination, paranoia, suicidal thoughts.  Her anger and irritability is much better.  She reported her grandmother has some health issues but she tried to visit her frequently.  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  She sleep good with the help of olanzapine .  She also started seeing nutritionist and now focusing on her appetite and weight.  She has no tremors, shakes or any EPS.  She feels the medicine helping her overall anxiety and she is able to go outside.  She is currently not in any relationship.  She is getting along with her family members.  She denies any suicidal thoughts.  She is taking Lamictal  and Klonopin  from her neurologist.  Patient seen in the past Ms. Hilario Lover but due to not consistent with the therapy she was not able to keep the appointment.  She like to go back in therapy.  We have provided referrals but she is getting issues due to her insurance.  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 Had psychological testing with IQ around 8.  H/O struggle with grades in school but  with help of VR finished high school with good grades. Took Concerta  for ADD and Risperdal for mood symptoms.  She was given BuSpar  and Geodon  which was discontinued after ineffective response.  No history of suicidal attempt, inpatient or abuse.    Outpatient Encounter Medications as of 11/26/2023  Medication Sig   clonazePAM  (KLONOPIN ) 0.5 MG tablet Take 1 tablet (0.5 mg total) by mouth 2 (two) times daily.   lamoTRIgine  (LAMICTAL ) 200 MG tablet Take 1 tablet (200 mg total) by mouth 2 (two) times daily.   levETIRAcetam  (KEPPRA ) 1000 MG tablet Take 1 tablet twice a day (Take with 500 mg tablet for a total of 1500 mg twice a day)   levETIRAcetam  (KEPPRA ) 500 MG tablet TAKE 1 TABLET BY MOUTH TWICE DAILY (TAKE WITH 1000MG  TO TOTAL 1500MG  TWICE DAILY )   OLANZapine  (ZYPREXA ) 5 MG tablet Take 1 tablet (5 mg total) by mouth at bedtime.   No facility-administered encounter medications on file as of 11/26/2023.    Recent Results (from the past 2160 hours)  Hemoglobin A1c     Status: None   Collection Time: 11/18/23  8:41 AM  Result Value Ref Range   Hgb A1c MFr Bld 5.4 4.8 - 5.6 %    Comment:          Prediabetes: 5.7 - 6.4          Diabetes: >6.4          Glycemic control for adults with diabetes: <7.0  Est. average glucose Bld gHb Est-mCnc 108 mg/dL  Insulin, random     Status: Abnormal   Collection Time: 11/18/23  8:41 AM  Result Value Ref Range   INSULIN 38.3 (H) 2.6 - 24.9 uIU/mL  Lipid Panel With LDL/HDL Ratio     Status: Abnormal   Collection Time: 11/18/23  8:41 AM  Result Value Ref Range   Cholesterol, Total 274 (H) 100 - 199 mg/dL   Triglycerides 952 0 - 149 mg/dL   HDL 41 >84 mg/dL   VLDL Cholesterol Cal 24 5 - 40 mg/dL   LDL Chol Calc (NIH) 132 (H) 0 - 99 mg/dL   LDL CALC COMMENT: Comment     Comment: Consider evaluating for Familial Hypercholesterolemia(FH), if clinically indicated.    LDL/HDL Ratio 5.1 (H) 0.0 - 3.2 ratio    Comment:                                      LDL/HDL Ratio                                             Men  Women                               1/2 Avg.Risk  1.0    1.5                                   Avg.Risk  3.6    3.2                                2X Avg.Risk  6.2    5.0                                3X Avg.Risk  8.0    6.1   TSH+T4F+T3Free     Status: None   Collection Time: 11/18/23  8:41 AM  Result Value Ref Range   TSH 3.750 0.450 - 4.500 uIU/mL   T3, Free 3.4 2.0 - 4.4 pg/mL   Free T4 1.20 0.82 - 1.77 ng/dL  VITAMIN D 25 Hydroxy (Vit-D Deficiency, Fractures)     Status: Abnormal   Collection Time: 11/18/23  8:41 AM  Result Value Ref Range   Vit D, 25-Hydroxy 16.9 (L) 30.0 - 100.0 ng/mL    Comment: Vitamin D deficiency has been defined by the Institute of Medicine and an Endocrine Society practice guideline as a level of serum 25-OH vitamin D less than 20 ng/mL (1,2). The Endocrine Society went on to further define vitamin D insufficiency as a level between 21 and 29 ng/mL (2). 1. IOM (Institute of Medicine). 2010. Dietary reference    intakes for calcium and D. [place]: The    [employer]. 2. Holick MF, Binkley Prosser, Bischoff-Ferrari HA, et al.    Evaluation, treatment, and prevention of vitamin D    deficiency: an Endocrine Society clinical practice    guideline. JCEM. 2011 Jul; 96(7):1911-30.   Comprehensive metabolic panel with GFR     Status:  Abnormal   Collection Time: 11/18/23  8:41 AM  Result Value Ref Range   Glucose 92 70 - 99 mg/dL   BUN 18 6 - 20 mg/dL   Creatinine, Ser 6.29 0.57 - 1.00 mg/dL   eGFR 89 >52 WU/XLK/4.40   BUN/Creatinine Ratio 20 9 - 23   Sodium 137 134 - 144 mmol/L   Potassium 4.7 3.5 - 5.2 mmol/L   Chloride 101 96 - 106 mmol/L   CO2 19 (L) 20 - 29 mmol/L   Calcium 10.0 8.7 - 10.2 mg/dL   Total Protein 7.5 6.0 - 8.5 g/dL   Albumin 5.0 4.0 - 5.0 g/dL   Globulin, Total 2.5 1.5 - 4.5 g/dL   Bilirubin Total 0.2 0.0 - 1.2 mg/dL   Alkaline Phosphatase 81 44 - 121  IU/L   AST 15 0 - 40 IU/L   ALT 13 0 - 32 IU/L     Psychiatric Specialty Exam: Physical Exam  Review of Systems  Weight 193 lb (87.5 kg).There is no height or weight on file to calculate BMI.  General Appearance: Casual  Eye Contact:  Fair  Speech:  Normal Rate  Volume:  Normal  Mood:  Anxious  Affect:  Appropriate  Thought Process:  Descriptions of Associations: Intact  Orientation:  Full (Time, Place, and Person)  Thought Content:  WDL  Suicidal Thoughts:  No  Homicidal Thoughts:  No  Memory:  Immediate;   Fair Recent;   Fair Remote;   Fair  Judgement:  Intact  Insight:  Present  Psychomotor Activity:  Normal  Concentration:  Concentration: Fair and Attention Span: Fair  Recall:  [employer] of Knowledge:  Fair  Language:  Fair  Akathisia:  No  Handed:  Right  AIMS (if indicated):     Assets:  Communication Skills Desire for Improvement Housing Transportation  ADL's:  Intact  Cognition:  WNL  Sleep:  ok       04/23/2023    2:52 PM 03/05/2023   10:30 AM 01/29/2023    4:16 PM 01/02/2023   12:16 PM 04/30/2022    9:39 AM  Depression screen PHQ 2/9  Decreased Interest 1 1 1 1 1   Down, Depressed, Hopeless 1 1 1 1 1   PHQ - 2 Score 2 2 2 2 2   Altered sleeping 1 1 1 1 1   Tired, decreased energy 1 1 1 1 1   Change in appetite 1 1 0 0   Feeling bad or failure about yourself  1 1 1  0 0  Trouble concentrating 1 1 1 1  0  Moving slowly or fidgety/restless 1 1 1 1 1   Suicidal thoughts 0 0 0 0   PHQ-9 Score 8 8 7 6 5   Difficult doing work/chores Somewhat difficult Somewhat difficult Somewhat difficult Somewhat difficult Somewhat difficult    Assessment/Plan: Mood disorder in conditions classified elsewhere - Plan: OLANZapine  (ZYPREXA ) 5 MG tablet  Attention deficit hyperactivity disorder, combined type - Plan: OLANZapine  (ZYPREXA ) 5 MG tablet  GAD (generalized anxiety disorder) - Plan: OLANZapine  (ZYPREXA ) 5 MG tablet  Learning disorder - Plan: OLANZapine  (ZYPREXA ) 5  MG tablet  Reviewed blood work results.  Hemoglobin A1c is normal.  Vitamin D is low.  CMP is normal.  Reviewed records from other provider.  She is now seeing a nutritionist and bariatric physician.  Discussed to keep the olanzapine  5 mg as it is helping most of her symptoms.  She still sometimes feels that she is not in her own body  and we discussed possibility of optimizing the dose but also more chances of side effects.  After discussion patient like to keep on the lower dose of olanzapine .  However we will refer her to see a therapist in our office.  I recommend to call us  back if she is any question or any concern.  Follow-up in 3 months.   Follow Up Instructions:     I discussed the assessment and treatment plan with the patient. The patient was provided an opportunity to ask questions and all were answered. The patient agreed with the plan and demonstrated an understanding of the instructions.   The patient was advised to call back or seek an in-person evaluation if the symptoms worsen or if the condition fails to improve as anticipated.    Collaboration of Care: Other provider involved in patient's care AEB notes are available in epic to review  Patient/Guardian was advised Release of Information must be obtained prior to any record release in order to collaborate their care with an outside provider. Patient/Guardian was advised if they have not already done so to contact the registration department to sign all necessary forms in order for us  to release information regarding their care.   Consent: Patient/Guardian gives verbal consent for treatment and assignment of benefits for services provided during this visit. Patient/Guardian expressed understanding and agreed to proceed.     Total encounter time 25 minutes which includes face-to-face time, chart reviewed, care coordination, order entry and documentation during this encounter.   Note: This document was prepared by [employer] voice  dictation technology and any errors that results from this process are unintentional.    Arturo Late, MD 11/26/2023

## 2023-11-28 ENCOUNTER — Ambulatory Visit (INDEPENDENT_AMBULATORY_CARE_PROVIDER_SITE_OTHER): Admitting: Bariatrics

## 2023-11-28 ENCOUNTER — Encounter: Payer: Self-pay | Admitting: Bariatrics

## 2023-11-28 VITALS — BP 113/80 | HR 82 | Ht 60.0 in | Wt 191.0 lb

## 2023-11-28 DIAGNOSIS — E7800 Pure hypercholesterolemia, unspecified: Secondary | ICD-10-CM | POA: Diagnosis not present

## 2023-11-28 DIAGNOSIS — E559 Vitamin D deficiency, unspecified: Secondary | ICD-10-CM

## 2023-11-28 DIAGNOSIS — Z6837 Body mass index (BMI) 37.0-37.9, adult: Secondary | ICD-10-CM | POA: Diagnosis not present

## 2023-11-28 DIAGNOSIS — E88819 Insulin resistance, unspecified: Secondary | ICD-10-CM | POA: Diagnosis not present

## 2023-11-28 DIAGNOSIS — E669 Obesity, unspecified: Secondary | ICD-10-CM

## 2023-11-28 MED ORDER — VITAMIN D (ERGOCALCIFEROL) 1.25 MG (50000 UNIT) PO CAPS: 50000.0000 [IU] | ORAL_CAPSULE | ORAL | 0 refills | Status: DC

## 2023-11-28 NOTE — Progress Notes (Deleted)
                                                                                                              WEIGHT SUMMARY AND BIOMETRICS  Weight Lost Since Last Visit: 2lb  Weight Gained Since Last Visit: 0   Vitals BP: 113/80 Pulse Rate: 82 SpO2: 94 %   Anthropometric Measurements Height: 5' (1.524 m) Weight: 191 lb (86.6 kg) BMI (Calculated): 37.3 Weight at Last Visit: 193lb Weight Lost Since Last Visit: 2lb Weight Gained Since Last Visit: 0 Starting Weight: 193lb Total Weight Loss (lbs): 2 lb (0.907 kg) Peak Weight: 193lb   Body Composition  Body Fat %: 42.3 % Fat Mass (lbs): 80.8 lbs Muscle Mass (lbs): 104.6 lbs Total Body Water (lbs): 76.4 lbs Visceral Fat Rating : 9   Other Clinical Data Fasting: no Labs: no Today's Visit #: 2 Starting Date: 11/18/23    OBESITY Maria Day is here to discuss her progress with her obesity treatment plan along with follow-up of her obesity related diagnoses.    Nutrition Plan: the Category 2 plan + 100 - 50% adherence.  Current exercise: walking  Interim History:  *** {aabnutritionassessment:29213}   Pharmacotherapy: Maria Day is on {dwwpharmacotherapy:29109} Adverse side effects: {dwwse:29122} Hunger is {EWCONTROLASSESSMENT:24261}.  Cravings are {EWCONTROLASSESSMENT:24261}.  Assessment/Plan:   There are no diagnoses linked to this encounter.    {dwwmorbid:29108::"Morbid Obesity"}: Current BMI BMI (Calculated): 37.3   Pharmacotherapy Plan {dwwmed:29123}  {dwwpharmacotherapy:29109}  Maria Day {CHL AMB IS/IS NOT:210130109} currently in the action stage of change. As such, her goal is to {MWMwtloss#1:210800005}.  She has agreed to {dwwsldiets:29085}.  Exercise goals: {MWM EXERCISE RECS:23473}  Behavioral modification strategies: {dwwslwtlossstrategies:29088}.  Maria Day has agreed to follow-up with our clinic in {NUMBER 1-10:22536} weeks.   No orders of the defined types were placed in this  encounter.   There are no discontinued medications.   No orders of the defined types were placed in this encounter.     Objective:   VITALS: Per patient if applicable, see vitals. GENERAL: Alert and in no acute distress. CARDIOPULMONARY: No increased WOB. Speaking in clear sentences.  PSYCH: Pleasant and cooperative. Speech normal rate and rhythm. Affect is appropriate. Insight and judgement are appropriate. Attention is focused, linear, and appropriate.  NEURO: Oriented as arrived to appointment on time with no prompting.   Attestation Statements:   This was prepared with the assistance of Engineer, civil (consulting).  Occasional wrong-word or sound-a-like substitutions may have occurred due to the inherent limitations of voice recognition

## 2023-11-28 NOTE — Progress Notes (Signed)
 First follow-up after initial visit.        WEIGHT SUMMARY AND BIOMETRICS  Weight Lost Since Last Visit: 2lb  Weight Gained Since Last Visit: 0   Vitals BP: 113/80 Pulse Rate: 82 SpO2: 94 %   Anthropometric Measurements Height: 5' (1.524 m) Weight: 191 lb (86.6 kg) BMI (Calculated): 37.3 Weight at Last Visit: 193lb Weight Lost Since Last Visit: 2lb Weight Gained Since Last Visit: 0 Starting Weight: 193lb Total Weight Loss (lbs): 2 lb (0.907 kg) Peak Weight: 193lb   Body Composition  Body Fat %: 42.3 % Fat Mass (lbs): 80.8 lbs Muscle Mass (lbs): 104.6 lbs Total Body Water (lbs): 76.4 lbs Visceral Fat Rating : 9   Other Clinical Data Fasting: no Labs: no Today's Visit #: 2 Starting Date: 11/18/23    OBESITY Maria Day is here to discuss her progress with her obesity treatment plan along with follow-up of her obesity related diagnoses.    Nutrition Plan: the Category 2 plan +100 - 50% adherence.  Current exercise: walking  Interim History:  She is down 2 pounds since her last visit.  Not eating all of the food on the plan., Protein intake is less than prescribed., Is skipping meals, Not journaling consistently., and Water intake is adequate.  Initial positives regarding the dietary plan:  Initial challenges regarding  the dietary plan:   Hunger is moderately controlled.  Cravings are moderately controlled.  Assessment/Plan:   Vitamin D  Deficiency Vitamin D  is not at goal of 50.  Most recent vitamin D  level was 16.9. She is not on vitamin D . Lab Results  Component Value Date   VD25OH 16.9 (L) 11/18/2023    Plan: Begin prescription vitamin D  50,000 IU weekly.   Generalized Obesity: Current BMI BMI (Calculated): 37.3    Insulin  Resistance Maria Day has had elevated fasting insulin  readings. Goal is HgbA1c < 5.7, fasting insulin  at l0  or less, and preferably at 5.  She  denies polyphagia. Medication(s): none Lab Results  Component Value Date   HGBA1C 5.4 11/18/2023   Lab Results  Component Value Date   INSULIN  38.3 (H) 11/18/2023    Plan Will work on the agreed upon plan. Will minimize refined carbohydrates ( sweets and starches), and focus more on complex carbohydrates.  Increase the micronutrients found in leafy greens, which include magnesium, polyphenols, and vitamin C which have been postulated to help with insulin  sensitivity. Minimize "fast food" and cook more meals at home.  Increase fiber to 25 to 30 grams daily.  Information sheet on " Insulin  Resistance and Prediabetes".   Information sheet on smart fruit.  Information sheet on nonstarchy carbohydrate vegetables. She will start journaling on a regular basis we discussed journaling for individual meals and collectively for the day at approximately 1300 cal with 80 to 90 g of protein.  Elevated cholesterol:   She denies a history of elevated cholesterol.  She is not aware of a strong family history of cholesterol.  Plan: Will come continue plan and exercise. Will recheck her cholesterol panel in approximately 3 months.  Maria Day is currently in the action stage of change. As such, her goal is to continue with weight loss efforts.  She has agreed to the Category 2 plan. + 100 calories.   Exercise goals: All adults should avoid inactivity. Some physical activity is better than none, and adults who participate in any amount of physical activity gain some health benefits.  She will continue to walk on a regular basis and will increase  her steps over time.  Behavioral modification strategies: increasing lean protein intake, decreasing simple carbohydrates , decrease eating out, meal planning , planning for success, increasing vegetables, keep healthy foods in the home, increase frequency of journaling, and mindful eating.  Maria Day has agreed to follow-up  with our clinic in 2 weeks.    Labs reviewed today from last visit (CMP, Lipids, HgbA1c, insulin, vitamin D and thyroid panel).   Objective:   VITALS: Per patient if applicable, see vitals. GENERAL: Alert and in no acute distress. CARDIOPULMONARY: No increased WOB. Speaking in clear sentences.  PSYCH: Pleasant and cooperative. Speech normal rate and rhythm. Affect is appropriate. Insight and judgement are appropriate. Attention is focused, linear, and appropriate.  NEURO: Oriented as arrived to appointment on time with no prompting.   Attestation Statements:   This was prepared with the assistance of Engineer, civil (consulting).  Occasional wrong-word or sound-a-like substitutions may have occurred due to the inherent limitations of voice recognition software.   Kirk Peper, DO

## 2023-12-09 ENCOUNTER — Encounter (HOSPITAL_COMMUNITY): Payer: Self-pay

## 2023-12-16 ENCOUNTER — Ambulatory Visit: Admitting: Bariatrics

## 2023-12-25 ENCOUNTER — Encounter: Payer: Self-pay | Admitting: Bariatrics

## 2023-12-25 ENCOUNTER — Ambulatory Visit (INDEPENDENT_AMBULATORY_CARE_PROVIDER_SITE_OTHER): Admitting: Bariatrics

## 2023-12-25 VITALS — BP 112/79 | HR 79 | Temp 97.7°F | Ht 60.0 in | Wt 191.0 lb

## 2023-12-25 DIAGNOSIS — Z6837 Body mass index (BMI) 37.0-37.9, adult: Secondary | ICD-10-CM

## 2023-12-25 DIAGNOSIS — E6609 Other obesity due to excess calories: Secondary | ICD-10-CM

## 2023-12-25 DIAGNOSIS — E669 Obesity, unspecified: Secondary | ICD-10-CM

## 2023-12-25 DIAGNOSIS — E88819 Insulin resistance, unspecified: Secondary | ICD-10-CM

## 2023-12-25 NOTE — Progress Notes (Signed)
 WEIGHT SUMMARY AND BIOMETRICS  Weight Lost Since Last Visit: 0  Weight Gained Since Last Visit: 0   Vitals Temp: 97.7 F (36.5 C) BP: 112/79 Pulse Rate: 79 SpO2: 92 %   Anthropometric Measurements Height: 5' (1.524 m) Weight: 191 lb (86.6 kg) BMI (Calculated): 37.3 Weight at Last Visit: 191lb Weight Lost Since Last Visit: 0 Weight Gained Since Last Visit: 0 Starting Weight: 193lb Total Weight Loss (lbs): 2 lb (0.907 kg) Peak Weight: 193lb   Body Composition  Body Fat %: 40.5 % Fat Mass (lbs): 77.6 lbs Muscle Mass (lbs): 108.2 lbs Total Body Water (lbs): 75.6 lbs Visceral Fat Rating : 8   Other Clinical Data Fasting: no Labs: no Today's Visit #: 3 Starting Date: 11/18/23    OBESITY Maria Day is here to discuss her progress with her obesity treatment plan along with follow-up of her obesity related diagnoses.    Nutrition Plan: the Category 2 plan+ 100 - 20% adherence.  Current exercise: walking  Interim History:  Her weight remains the same.  Eating all of the food on the plan., Protein intake is as prescribed, Is not skipping meals, and Water intake is adequate.   Pharmacotherapy: Maria Day is on not on any medications for weight loss at this time, but she is not strictly opposed to medication.  She is on several weight promoting medications, see medication list and may benefit from metformin and/or other antiobesity medication Hunger is moderately controlled.  Cravings are moderately controlled.  Assessment/Plan:   Insulin  Resistance Maria Day has had elevated fasting insulin  readings. Goal is HgbA1c < 5.7, fasting insulin  at l0 or less, and preferably at 5.  She reports polyphagia. Medication(s): none Lab Results  Component Value Date   HGBA1C 5.4 11/18/2023   Lab Results  Component Value Date   INSULIN  38.3 (H) 11/18/2023     Plan Medication(s): none.  Discussed metformin with patient and her mother.  Will work on the agreed upon plan. Will minimize refined carbohydrates ( sweets and starches), and focus more on complex carbohydrates.  Increase the micronutrients found in leafy greens, which include magnesium, polyphenols, and vitamin C which have been postulated to help with insulin  sensitivity. Minimize fast food and cook more meals at home.  Reviewed the following: Healthy snacks, healthy proteins, healthy carbohydrates.  We discussed eating out and handouts were given.  We reviewed protein sources and handouts were given.  Discussed journaling and journaling sheet was given.    Generalized Obesity: Current BMI BMI (Calculated): 37.3   Pharmacotherapy Plan Discussed Metformin, risk benefits and side effects and handout was given.  Maria Day is currently in the action stage of change. As such, her goal is to continue with weight loss efforts.  She has agreed to the Category 2 plan plus 100 calories.   Exercise goals: All adults should avoid inactivity. Some physical activity is better than  none, and adults who participate in any amount of physical activity gain some health benefits.  Behavioral modification strategies: increasing lean protein intake, no meal skipping, meal planning , increase water intake, better snacking choices, planning for success, increasing vegetables, increasing lower sugar fruits, increasing fiber rich foods, avoiding temptations, keep healthy foods in the home, increase frequency of journaling, weigh protein portions, measure portion sizes, and mindful eating.  [name] has agreed to follow-up with our clinic in 2 weeks.      Objective:   VITALS: Per patient if applicable, see vitals. GENERAL: Alert and in no acute distress. CARDIOPULMONARY: No increased WOB. Speaking in clear sentences.  PSYCH: Pleasant and cooperative. Speech normal rate and rhythm. Affect is appropriate.  Insight and judgement are appropriate. Attention is focused, linear, and appropriate.  NEURO: Oriented as arrived to appointment on time with no prompting.   Attestation Statements:   Time spent on visit in care of the patient today including the items listed below was 30 minutes.    30 minutes were spent talking about her meal plan,   minutes for face to face counseling implementing the plan, discussing the specifics of how to arrange meals, meal planning, water intake.   I spent face to face time discussing his/her plan, including snacks, protein options reviewed her meal plan again category 2 in detail, and discussed eating out   Discussed the following information sheets: Journaling sheet, category 2, eating out sheet, protein content of food, Metformin.    I I discussed metformin with patient and her mother discussing the risk and benefits.   I additionally spent time documenting, reviewing, and checking the codes before submitting.    This was prepared with the assistance of Engineer, civil (consulting).  Occasional wrong-word or sound-a-like substitutions may have occurred due to the inherent limitations of voice recognition   Clayborne Daring, DO

## 2024-01-15 ENCOUNTER — Encounter: Payer: Self-pay | Admitting: Bariatrics

## 2024-01-15 ENCOUNTER — Ambulatory Visit (INDEPENDENT_AMBULATORY_CARE_PROVIDER_SITE_OTHER): Admitting: Bariatrics

## 2024-01-15 ENCOUNTER — Encounter (HOSPITAL_COMMUNITY): Payer: Self-pay

## 2024-01-15 VITALS — BP 104/71 | HR 87 | Temp 97.6°F | Ht 60.0 in | Wt 188.0 lb

## 2024-01-15 DIAGNOSIS — Z6836 Body mass index (BMI) 36.0-36.9, adult: Secondary | ICD-10-CM | POA: Diagnosis not present

## 2024-01-15 DIAGNOSIS — E559 Vitamin D deficiency, unspecified: Secondary | ICD-10-CM

## 2024-01-15 DIAGNOSIS — R632 Polyphagia: Secondary | ICD-10-CM

## 2024-01-15 DIAGNOSIS — E88819 Insulin resistance, unspecified: Secondary | ICD-10-CM

## 2024-01-15 DIAGNOSIS — E669 Obesity, unspecified: Secondary | ICD-10-CM

## 2024-01-15 MED ORDER — VITAMIN D (ERGOCALCIFEROL) 1.25 MG (50000 UNIT) PO CAPS: 50000.0000 [IU] | ORAL_CAPSULE | ORAL | 0 refills | Status: DC

## 2024-01-15 NOTE — Progress Notes (Signed)
 WEIGHT SUMMARY AND BIOMETRICS  Weight Lost Since Last Visit: 3lb  Weight Gained Since Last Visit: 0   Vitals Temp: 97.6 F (36.4 C) BP: 104/71 Pulse Rate: 87 SpO2: 93 %   Anthropometric Measurements Height: 5' (1.524 m) Weight: 188 lb (85.3 kg) BMI (Calculated): 36.72 Weight at Last Visit: 191lb Weight Lost Since Last Visit: 3lb Weight Gained Since Last Visit: 0 Starting Weight: 193lb Total Weight Loss (lbs): 5 lb (2.268 kg) Peak Weight: 193lb   Body Composition  Body Fat %: 39 % Fat Mass (lbs): 73.4 lbs Muscle Mass (lbs): 109.2 lbs Total Body Water (lbs): 77.6 lbs Visceral Fat Rating : 8   Other Clinical Data Fasting: no Labs: no Today's Visit #: 4 Starting Date: 11/18/23    OBESITY Marshea is here to discuss her progress with her obesity treatment plan along with follow-up of her obesity related diagnoses.    Nutrition Plan: the Category 2 plan + 100 - 40% adherence.  Current exercise: walking  Interim History:  She is down another 3 lbs since her last visit.  Eating all of the food on the plan., Protein intake is as prescribed, Is not skipping meals, Water intake is adequate., Reports polyphagia, and Reports excessive cravings.   Pharmacotherapy: She is considering a GLP-1.  She has concerns about GLP-1 secondary to her multiple medications especially her seizure medication.  Her mother was with her today and wants to talk with a relative who is taking [place].  Hunger is poorly controlled.  Cravings are poorly controlled.  Assessment/Plan:   Insulin  Resistance Mariadelaluz has had elevated fasting insulin  readings. Goal is HgbA1c < 5.7, fasting insulin  at l0 or less, and preferably at 5.  She reports polyphagia. Medication(s): none Lab Results  Component Value Date   HGBA1C 5.4 11/18/2023   Lab Results  Component Value Date   INSULIN   38.3 (H) 11/18/2023    Plan Medication(s): Will consider a GLP-1.  I discussed the side effects risk and benefits of the GLP-1's.  GLP-1 sheet was given.  She will check with her insurance to see if she is covered for any of the antiobesity medications. Will work on the agreed upon plan. Will minimize refined carbohydrates ( sweets and starches), and focus more on complex carbohydrates.  Increase the micronutrients found in leafy greens, which include magnesium, polyphenols, and vitamin C which have been postulated to help with insulin  sensitivity. Minimize fast food and cook more meals at home.  Increase fiber to 25 to 30 grams daily.   Polyphagia Azha endorses excessive hunger.  Medication(s): none Appetite is poorly controlled. Cravings are poorly controlled.   Plan: Medication(s): Will consider GLP-1's Will get the records in regard to her sleep study that she had through [facility].  She will sign for records today. Will increase water, protein and fiber to help assuage hunger.  Will minimize  foods that have a high glucose index/load to minimize reactive hypoglycemia.     Generalized Obesity: Current BMI BMI (Calculated): 36.72   Pharmacotherapy Plan  Talyn is currently in the action stage of change. As such, her goal is to continue with weight loss efforts.  She has agreed to the Category 2 plan + 100 calories.   Exercise goals: All adults should avoid inactivity. Some physical activity is better than none, and adults who participate in any amount of physical activity gain some health benefits. She will start walking most days per week.   Behavioral modification strategies: increasing lean protein intake, decreasing simple carbohydrates , no meal skipping, decrease eating out, meal planning , increase water intake, better snacking choices, planning for success, increasing vegetables, increasing fiber rich foods, keep healthy foods in the home, weigh protein portions,  measure portion sizes, and mindful eating.  Mianna has agreed to follow-up with our clinic in 2 weeks.      Objective:   VITALS: Per patient if applicable, see vitals. GENERAL: Alert and in no acute distress. CARDIOPULMONARY: No increased WOB. Speaking in clear sentences.  PSYCH: Pleasant and cooperative. Speech normal rate and rhythm. Affect is appropriate. Insight and judgement are appropriate. Attention is focused, linear, and appropriate.  NEURO: Oriented as arrived to appointment on time with no prompting.   Attestation Statements:   This was prepared with the assistance of Engineer, civil (consulting).  Occasional wrong-word or sound-a-like substitutions may have occurred due to the inherent limitations of voice recognition   Clayborne Daring, DO

## 2024-01-15 NOTE — Telephone Encounter (Signed)
 It is 5 mg.  If you still feel groggy and sleepy then she should not take it tonight and restart taking from tomorrow.

## 2024-01-19 ENCOUNTER — Encounter: Payer: Self-pay | Admitting: Bariatrics

## 2024-01-20 NOTE — Telephone Encounter (Signed)
 Call to patient to make sure she was not taking a pill everyday, but only once weekly.  She has made writer aware that she is still taking the previous prescription and has not yet picked up the refill from 01/15/2024.  No further needs, patient is taking the medication correctly. TS

## 2024-01-28 ENCOUNTER — Encounter: Payer: Self-pay | Admitting: Bariatrics

## 2024-01-28 ENCOUNTER — Ambulatory Visit (INDEPENDENT_AMBULATORY_CARE_PROVIDER_SITE_OTHER): Admitting: Bariatrics

## 2024-01-28 VITALS — BP 107/75 | HR 84 | Temp 97.6°F | Ht 60.0 in | Wt 186.0 lb

## 2024-01-28 DIAGNOSIS — E669 Obesity, unspecified: Secondary | ICD-10-CM | POA: Diagnosis not present

## 2024-01-28 DIAGNOSIS — Z6836 Body mass index (BMI) 36.0-36.9, adult: Secondary | ICD-10-CM

## 2024-01-28 DIAGNOSIS — R632 Polyphagia: Secondary | ICD-10-CM | POA: Diagnosis not present

## 2024-01-28 DIAGNOSIS — G4733 Obstructive sleep apnea (adult) (pediatric): Secondary | ICD-10-CM

## 2024-01-28 NOTE — Progress Notes (Signed)
 WEIGHT SUMMARY AND BIOMETRICS  Weight Lost Since Last Visit: 2lb  Weight Gained Since Last Visit: 0   Vitals Temp: 97.6 F (36.4 C) BP: 107/75 Pulse Rate: 84 SpO2: 93 %   Anthropometric Measurements Height: 5' (1.524 m) Weight: 186 lb (84.4 kg) BMI (Calculated): 36.33 Weight at Last Visit: 188lb Weight Lost Since Last Visit: 2lb Weight Gained Since Last Visit: 0 Starting Weight: 193lb Total Weight Loss (lbs): 7 lb (3.175 kg) Peak Weight: 193lb   Body Composition  Body Fat %: 37 % Fat Mass (lbs): 68.8 lbs Muscle Mass (lbs): 111.2 lbs Total Body Water (lbs): 77.2 lbs Visceral Fat Rating : 7   Other Clinical Data Fasting: no Labs: no Today's Visit #: 5 Starting Date: 11/18/23    OBESITY Maria Day is here to discuss her progress with her obesity treatment plan along with follow-up of her obesity related diagnoses.    Nutrition Plan: the Category 2 plan + 100 - 60% adherence.  Current exercise: walking  Interim History:  She is down an additional 2 lbs since her last visit.  Eating all of the food on the plan., Protein intake is as prescribed, Is drinking sugar sweetened beverages, Is not skipping meals, Water intake is adequate., Reports polyphagia, and Reports excessive cravings.   Pharmacotherapy:  Hunger is poorly controlled.  Cravings are moderately controlled.  Assessment/Plan:   Polyphagia Maria Day endorses excessive hunger.  Medication(s): none   Plan: Medication(s): none Will increase water, protein and fiber to help assuage hunger.  Will minimize foods that have a high glucose index/load to minimize reactive hypoglycemia.  Discussed Metformin with the patient and information sheet given.Maria Day  She will talk with her mother and discuss whether she would like to begin metformin.  If so,  we can start her on the Metformin at her next  visit.   Obstructive Sleep Apnea Maria Day has a diagnosis of sleep apnea. She reports that she is not using a CPAP regularly. She states that she could not it. Reports relatively restful sleep.   Plan: Continue to practice good sleep hygiene.  Will add in elements of an antiinflammatory diet and  add in elements of a Mediterranean diet.  Reduce intake of carbohydrates for weight loss.  She does not want to try Zepbound at this time.  She will make better food choices. She will continue using her walking pad.     Generalized Obesity: Current BMI BMI (Calculated): 36.33   Pharmacotherapy Plan Consider metformin for appetite suppression.  Information sheet given and she will consider this and we can discuss at her next visit.  Maria Day is currently in the action stage of change. As such, her goal is to continue with weight loss efforts.  She has agreed to the Category 2 plan + 100 calories   Exercise goals: All adults should avoid inactivity. Some physical activity is better than  none, and adults who participate in any amount of physical activity gain some health benefits. She will use her walking pad 15 minutes everyday.   Behavioral modification strategies: increasing lean protein intake, decreasing simple carbohydrates , no meal skipping, decrease eating out, meal planning , increase water intake, better snacking choices, planning for success, avoiding temptations, and keep healthy foods in the home.  Maria Day has agreed to follow-up with our clinic in 3 weeks.    Objective:   VITALS: Per patient if applicable, see vitals. GENERAL: Alert and in no acute distress. CARDIOPULMONARY: No increased WOB. Speaking in clear sentences.  PSYCH: Pleasant and cooperative. Speech normal rate and rhythm. Affect is appropriate. Insight and judgement are appropriate. Attention is focused, linear, and appropriate.  NEURO: Oriented as arrived to appointment on time with no prompting.   Attestation  Statements:   This was prepared with the assistance of Engineer, civil (consulting).  Occasional wrong-word or sound-a-like substitutions may have occurred due to the inherent limitations of voice recognition   Clayborne Daring, DO

## 2024-01-30 ENCOUNTER — Ambulatory Visit: Admitting: Bariatrics

## 2024-02-06 ENCOUNTER — Ambulatory Visit (HOSPITAL_COMMUNITY): Admitting: Clinical

## 2024-02-20 ENCOUNTER — Other Ambulatory Visit (HOSPITAL_COMMUNITY): Payer: Self-pay | Admitting: Psychiatry

## 2024-02-20 DIAGNOSIS — F819 Developmental disorder of scholastic skills, unspecified: Secondary | ICD-10-CM

## 2024-02-20 DIAGNOSIS — F902 Attention-deficit hyperactivity disorder, combined type: Secondary | ICD-10-CM

## 2024-02-20 DIAGNOSIS — F411 Generalized anxiety disorder: Secondary | ICD-10-CM

## 2024-02-20 DIAGNOSIS — F0630 Mood disorder due to known physiological condition, unspecified: Secondary | ICD-10-CM

## 2024-02-24 ENCOUNTER — Telehealth (HOSPITAL_BASED_OUTPATIENT_CLINIC_OR_DEPARTMENT_OTHER): Payer: Self-pay | Admitting: Psychiatry

## 2024-02-24 ENCOUNTER — Encounter: Payer: Self-pay | Admitting: Neurology

## 2024-02-24 ENCOUNTER — Encounter (HOSPITAL_COMMUNITY): Payer: Self-pay | Admitting: Psychiatry

## 2024-02-24 VITALS — Wt 190.0 lb

## 2024-02-24 DIAGNOSIS — F0630 Mood disorder due to known physiological condition, unspecified: Secondary | ICD-10-CM

## 2024-02-24 DIAGNOSIS — F819 Developmental disorder of scholastic skills, unspecified: Secondary | ICD-10-CM

## 2024-02-24 DIAGNOSIS — F411 Generalized anxiety disorder: Secondary | ICD-10-CM | POA: Diagnosis not present

## 2024-02-24 DIAGNOSIS — F902 Attention-deficit hyperactivity disorder, combined type: Secondary | ICD-10-CM | POA: Diagnosis not present

## 2024-02-24 MED ORDER — OLANZAPINE 5 MG PO TABS: 5.0000 mg | ORAL_TABLET | Freq: Every day | ORAL | 2 refills | Status: DC

## 2024-02-24 NOTE — Progress Notes (Signed)
 [facility] MD Virtual Progress Note   Patient Location: [facility] Provider Location: Home Office  I connect with patient by video and verified that I am speaking with correct person by using two identifiers. I discussed the limitations of evaluation and management by telemedicine and the availability of in person appointments. I also discussed with the patient that there may be a patient responsible charge related to this service. The patient expressed understanding and agreed to proceed.  Maria Day [id number] 28 y.o.  02/24/2024 2:40 PM  History of Present Illness:  Patient is evaluated by video session.  She reported feeling sad because father recently diagnosed with liver cancer and not doing very well.  She is currently staying with her mother and her father is with patient's stepmother and grandparents.  She reported medicine working and she does not have any anger, mood swings, irritability, paranoia or any hallucination.  She is still sometimes feel disassociated symptoms when she feels out of the body but the symptoms does not stay too long.  She feels tired with the medication and wondering if she can try different medication.  Explained the risks and benefits of medication and after some discussion patient agreed to keep the same dose.  I recommend to take the medication before she go to sleep.  She is remains seizure-free and happy about it.  She is start driving short distance.  Occasionally nightmares and flashback but denies any hallucination, paranoia or any suicidal thoughts.  She is scheduled to see Ms. Edwena end of this month.  She is happy that able to connect with a therapist.  She is also seeing neurology and getting Klonopin  and Lamictal .  She denies drinking or using any illegal substances.  She started seeing and talking someone but not sure about the future of the relationship.  She is also in a weight loss program.  Her weight fluctuates and she  understand that she need to focus on healthy diet and exercise.  She has no tremor or shakes or any EPS.  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 Had psychological testing with IQ around 59.  H/O struggle with grades in school but with help of VR finished high school with good grades. Took Concerta  for ADD and Risperdal for mood symptoms.  She was given BuSpar  and Geodon  which was discontinued after ineffective response.  No history of suicidal attempt, inpatient or abuse.   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   lamoTRIgine  (LAMICTAL ) 200 MG tablet Take 1 tablet (200 mg total) by mouth 2 (two) times daily.   levETIRAcetam  (KEPPRA ) 1000 MG tablet Take 1 tablet twice a day (Take with 500 mg tablet for a total of 1500 mg twice a day)   levETIRAcetam  (KEPPRA ) 500 MG tablet TAKE 1 TABLET BY MOUTH  TWICE DAILY (TAKE WITH 1000MG  TO TOTAL 1500MG  TWICE DAILY )   OLANZapine  (ZYPREXA ) 5 MG tablet Take 1 tablet (5 mg total) by mouth at bedtime.   Vitamin D , Ergocalciferol , (DRISDOL ) 1.25 MG (50000 UNIT) CAPS capsule Take 1 capsule (50,000 Units total) by mouth every 7 (seven) days.   No facility-administered encounter medications on file as of 02/24/2024.    No  results found for this or any previous visit (from the past 2160 hours).   Psychiatric Specialty Exam: Physical Exam  Review of Systems  Weight 190 lb (86.2 kg).There is no height or weight on file to calculate BMI.  General Appearance: Casual  Eye Contact:  Fair  Speech:  Normal Rate  Volume:  Normal  Mood:  Anxious  Affect:  Appropriate  Thought Process:  Descriptions of Associations: Intact  Orientation:  Full (Time, Place, and Person)  Thought Content:  Logical  Suicidal Thoughts:  No  Homicidal Thoughts:  No  Memory:  Immediate;   Fair Recent;   Fair Remote;   Fair  Judgement:  Fair  Insight:  Present  Psychomotor Activity:  Decreased  Concentration:  Concentration: Fair and Attention Span: Fair  Recall:  [employer] of Knowledge:  Fair  Language:  Good  Akathisia:  No  Handed:  Right  AIMS (if indicated):     Assets:  Communication Skills Desire for Improvement Housing Social Support Transportation  ADL's:  Intact  Cognition:  WNL  Sleep:  too much       04/23/2023    2:52 PM 03/05/2023   10:30 AM 01/29/2023    4:16 PM 01/02/2023   12:16 PM 04/30/2022    9:39 AM  Depression screen PHQ 2/9  Decreased Interest 1 1 1 1 1   Down, Depressed, Hopeless 1 1 1 1 1   PHQ - 2 Score 2 2 2 2 2   Altered sleeping 1 1 1 1 1   Tired, decreased energy 1 1 1 1 1   Change in appetite 1 1 0 0   Feeling bad or failure about yourself  1 1 1  0 0  Trouble concentrating 1 1 1 1  0  Moving slowly or fidgety/restless 1 1 1 1 1   Suicidal thoughts 0 0 0 0   PHQ-9 Score 8 8 7 6 5   Difficult doing work/chores Somewhat difficult Somewhat difficult Somewhat difficult Somewhat difficult Somewhat difficult    Assessment/Plan: GAD (generalized anxiety disorder) - Plan: OLANZapine  (ZYPREXA ) 5 MG tablet  Mood disorder in conditions classified elsewhere - Plan: OLANZapine  (ZYPREXA ) 5 MG tablet  Attention deficit hyperactivity disorder, combined type - Plan: OLANZapine  (ZYPREXA ) 5 MG  tablet  Learning disorder - Plan: OLANZapine  (ZYPREXA ) 5 MG tablet  Patient is a 28 year old female with a history of migraine, sleep apnea, epilepsy, ADHD, learning disability, generalized anxiety disorder and mood disorder.  Currently on olanzapine  5 mg at bedtime.  It is helping her sleep, anxiety, mood symptoms.  Recently seen PCP/weight loss and started taking vitamin D .  Discussed sedation in the morning and recommend to take the medication later at night.  She is going to see a therapist end of this month.  Encouraged to keep that appointment.  Discussed medication side effects and benefits.  Recommend to call back if she is any question or any concern.  Follow-up in 3 months.   Follow Up Instructions:     I discussed the assessment and treatment plan with the patient. The patient was provided an opportunity to ask questions and  all were answered. The patient agreed with the plan and demonstrated an understanding of the instructions.   The patient was advised to call back or seek an in-person evaluation if the symptoms worsen or if the condition fails to improve as anticipated.    Collaboration of Care: Other provider involved in patient's care AEB notes are available in epic to review  Patient/Guardian was advised Release of Information must be obtained prior to any record release in order to collaborate their care with an outside provider. Patient/Guardian was advised if they have not already done so to contact the registration department to sign all necessary forms in order for us  to release information regarding their care.   Consent: Patient/Guardian gives verbal consent for treatment and assignment of benefits for services provided during this visit. Patient/Guardian expressed understanding and agreed to proceed.     Total encounter time 18 minutes which includes face-to-face time, chart reviewed, care coordination, order entry and documentation during this encounter.   Note:  This document was prepared by [employer] voice dictation technology and any errors that results from this process are unintentional.    Leni ONEIDA Client, MD 02/24/2024

## 2024-02-25 ENCOUNTER — Encounter: Payer: Self-pay | Admitting: Bariatrics

## 2024-02-25 ENCOUNTER — Other Ambulatory Visit

## 2024-02-25 ENCOUNTER — Ambulatory Visit (INDEPENDENT_AMBULATORY_CARE_PROVIDER_SITE_OTHER): Admitting: Bariatrics

## 2024-02-25 ENCOUNTER — Telehealth: Payer: Self-pay | Admitting: Neurology

## 2024-02-25 ENCOUNTER — Other Ambulatory Visit: Payer: Self-pay

## 2024-02-25 VITALS — BP 112/83 | HR 90 | Temp 98.0°F | Ht 60.0 in | Wt 187.0 lb

## 2024-02-25 DIAGNOSIS — E66812 Obesity, class 2: Secondary | ICD-10-CM

## 2024-02-25 DIAGNOSIS — E559 Vitamin D deficiency, unspecified: Secondary | ICD-10-CM | POA: Diagnosis not present

## 2024-02-25 DIAGNOSIS — Z6836 Body mass index (BMI) 36.0-36.9, adult: Secondary | ICD-10-CM

## 2024-02-25 DIAGNOSIS — G40309 Generalized idiopathic epilepsy and epileptic syndromes, not intractable, without status epilepticus: Secondary | ICD-10-CM

## 2024-02-25 DIAGNOSIS — E669 Obesity, unspecified: Secondary | ICD-10-CM | POA: Diagnosis not present

## 2024-02-25 DIAGNOSIS — R632 Polyphagia: Secondary | ICD-10-CM | POA: Diagnosis not present

## 2024-02-25 MED ORDER — VITAMIN D (ERGOCALCIFEROL) 1.25 MG (50000 UNIT) PO CAPS: 50000.0000 [IU] | ORAL_CAPSULE | ORAL | 0 refills | Status: DC

## 2024-02-25 NOTE — Telephone Encounter (Signed)
 Pt. Cld about Sx spells, would like a contact bk from nurse or doctor  Hello Dr Georjean, I was just wanting to inform you around 5ish I and sometimes during the day I have almost sizure spells they last for a few mins and then they go away but this has been the 2nd time now and just wanted to inform you   Thank you Koraima

## 2024-02-25 NOTE — Progress Notes (Signed)
 WEIGHT SUMMARY AND BIOMETRICS  Weight Lost Since Last Visit: 0  Weight Gained Since Last Visit: 1lb   Vitals Temp: 98 F (36.7 C) BP: 112/83 Pulse Rate: 90 SpO2: 95 %   Anthropometric Measurements Height: 5' (1.524 m) Weight: 187 lb (84.8 kg) BMI (Calculated): 36.52 Weight at Last Visit: 186lb Weight Lost Since Last Visit: 0 Weight Gained Since Last Visit: 1lb Starting Weight: 193lb Total Weight Loss (lbs): 6 lb (2.722 kg) Peak Weight: 193lb   Body Composition  Body Fat %: 37.1 % Fat Mass (lbs): 69.6 lbs Muscle Mass (lbs): 112.2 lbs Total Body Water (lbs): 75.8 lbs Visceral Fat Rating : 7   Other Clinical Data Fasting: no Labs: no Today's Visit #: 6 Starting Date: 11/18/23    OBESITY Maria Day is here to discuss her progress with her obesity treatment plan along with follow-up of her obesity related diagnoses.    Nutrition Plan: the Category 2 plan + 100 - 60% adherence.  Current exercise: walking  Interim History:  She is up 1 lb.  Eating all of the food on the plan., Protein intake is as prescribed, Is not skipping meals, Water intake is adequate., and Reports excessive cravings.  Hunger is moderately controlled.  Cravings are moderately controlled.  Assessment/Plan:   Vitamin D  Deficiency Vitamin D  is not at goal of 50.  Most recent vitamin D  level was 16.9. She is on  prescription ergocalciferol  50,000 IU weekly. Lab Results  Component Value Date   VD25OH 16.9 (L) 11/18/2023    Plan: Refill prescription vitamin D  50,000 IU weekly.    Generalized Obesity: Current BMI BMI (Calculated): 36.52    Polyphagia Maria Day endorses excessive hunger.  Medication(s): none. She is on other medications (anti-seizure) and is concerned about side effects.  Effects of medication:  moderately controlled. Cravings are moderately controlled.    Plan: Medication(s): none  Will increase water, protein and fiber to help assuage hunger.  Will minimize foods that have a high glucose index/load to minimize reactive hypoglycemia.   Pharmacotherapy Plan She is not currently on any antiobesity medications nor does she want to begin an antiobesity medication at this time.  Have discussed antiobesity medications in the past and she does not want to begin any secondary to possible interactions with antiseizure medications.  Maria Day is currently in the action stage of change. As such, her goal is to continue with weight loss efforts.  She has agreed to the Category 2 plan + 100 calories.   Exercise goals: For substantial health benefits, adults should do at least 150 minutes (2 hours and 30 minutes) a week of moderate-intensity, or 75 minutes (1 hour and 15 minutes) a week of vigorous-intensity aerobic physical activity, or an equivalent combination of moderate- and vigorous-intensity aerobic activity. Aerobic activity should be performed in episodes of at least 10 minutes, and preferably, it should be spread  throughout the week.  Behavioral modification strategies: increasing lean protein intake, decreasing simple carbohydrates , no meal skipping, decrease eating out, meal planning , increase water intake, better snacking choices, increasing vegetables, avoiding temptations, weigh protein portions, measure portion sizes, and mindful eating.  Maria Day has agreed to follow-up with our clinic in 4 weeks.    Objective:   VITALS: Per patient if applicable, see vitals. GENERAL: Alert and in no acute distress. CARDIOPULMONARY: No increased WOB. Speaking in clear sentences.  PSYCH: Pleasant and cooperative. Speech normal rate and rhythm. Affect is appropriate. Insight and judgement are appropriate. Attention is focused, linear, and appropriate.  NEURO: Oriented as arrived to appointment on time with no prompting.   Attestation Statements:   This  was prepared with the assistance of Engineer, civil (consulting).  Occasional wrong-word or sound-a-like substitutions may have occurred due to the inherent limitations of voice recognition   Clayborne Daring, DO

## 2024-02-26 ENCOUNTER — Other Ambulatory Visit

## 2024-02-26 DIAGNOSIS — G40309 Generalized idiopathic epilepsy and epileptic syndromes, not intractable, without status epilepticus: Secondary | ICD-10-CM | POA: Diagnosis not present

## 2024-02-27 ENCOUNTER — Ambulatory Visit (HOSPITAL_COMMUNITY): Admitting: Clinical

## 2024-02-27 ENCOUNTER — Encounter (HOSPITAL_COMMUNITY): Payer: Self-pay | Admitting: Clinical

## 2024-02-27 DIAGNOSIS — F411 Generalized anxiety disorder: Secondary | ICD-10-CM

## 2024-02-27 DIAGNOSIS — F902 Attention-deficit hyperactivity disorder, combined type: Secondary | ICD-10-CM

## 2024-02-27 DIAGNOSIS — F481 Depersonalization-derealization syndrome: Secondary | ICD-10-CM | POA: Diagnosis not present

## 2024-02-27 DIAGNOSIS — F70 Mild intellectual disabilities: Secondary | ICD-10-CM

## 2024-02-27 DIAGNOSIS — F439 Reaction to severe stress, unspecified: Secondary | ICD-10-CM

## 2024-02-27 DIAGNOSIS — F0630 Mood disorder due to known physiological condition, unspecified: Secondary | ICD-10-CM

## 2024-02-27 DIAGNOSIS — F32A Depression, unspecified: Secondary | ICD-10-CM

## 2024-02-27 NOTE — Progress Notes (Unsigned)
 Comprehensive Clinical Assessment (CCA) Note  02/28/2024 Maria Day [id number]  Chief Complaint:  Chief Complaint  Patient presents with   Establish Care   Visit Diagnosis:   Encounter Diagnoses  Name Primary?   Attention deficit hyperactivity disorder, combined type    GAD (generalized anxiety disorder) Yes   Intellectual developmental disorder, mild    Derealization (HCC)    Trauma and stressor-related disorder    Depression, unspecified depression type     CCA Biopsychosocial Intake/Chief Complaint:  Patient is a 28yo female with a history of epilepsy, Generalized Anxiety Disorder, ADHD/combined type, moderate depression, and mild Intellectual Development Disorder as shown on a 11/05/22 neuropsychological evaluation by Dr. Arthea Maryland, with fairly recent addition of out-of-body sensations that might be characterized as derealization and/or dissociation.  Today she presents for therapy at the recommendation of her psychiatrist.  Mother attended the assessment and provided much input, although patient was assertive about expressing what she feels.  She currently lives and sleeps with mother, does not leave the house like she did previously, feeling like she cannot go out and enjoy herself.  Mother is concerned about her being independent in the future.  Patient's full brother committed suicide when she was 20yo and her maternal grandmother also died of suicide.  Her father has recently been diagnosed with a second round of cancer, this time liver, and his prognosis is quite poor.  She feels helpless.  She has some compulsive behaviors, such as freaking out if she takes her medicines at 6:35am when she was supposed to take them at 6:30am.  She reports flashbacks to finding out bad news, inappropriate angry, and hypervigilance as just a few symptoms she deals with.  Living with her seizures and never knowing when she is going to have one is awful and as a result, she wants to be with her  mother 24 hours a day.  She will not even go to bed until her mother does so, will sleep on the couch next to mother until then.  She has been seeing a nutritionist and has lost a little weight.  She has used alcohol and marijuana but does not use them currently and reports never having a problem with them.  She is scared during her sleep of seizures and falling off the bed, so has nightmares.  She drives her own car and wants in-person sessions.  Current Symptoms/Problems: out-of-body experiences since being at [facility] and taken off all her seizure medicines a few years ago, depression, fear, cannot abide social situations any longer  Patient Reported Schizophrenia/Schizoaffective Diagnosis in Past: No  Strengths: can count money, customer service, good with people, dependable  Preferences: individual therapy  Abilities: Can easily engage in talking about her thoughts, feelings, and actions.  Is motivated for treatment.  Type of Services Patient Feels are Needed: individual therapy  Initial Clinical Notes/Concerns: Patient resists mom speaking for her and is very insightful as she explains the reasons for her disagreement.  Mental Health Symptoms Depression:  Change in energy/activity; Difficulty Concentrating; Fatigue; Hopelessness; Increase/decrease in appetite; Irritability; Sleep (too much or little); Worthlessness   Duration of Depressive symptoms: Greater than two weeks   Mania:  None   Anxiety:   Difficulty concentrating; Fatigue; Irritability; Restlessness; Sleep; Tension; Worrying   Psychosis:  None   Duration of Psychotic symptoms: No data recorded  Trauma:  Avoids reminders of event; Detachment from others; Difficulty staying/falling asleep; Emotional numbing; Guilt/shame; Hypervigilance; Irritability/anger; Re-experience of traumatic event   Obsessions:  Cause anxiety; Attempts to suppress/neutralize   Compulsions:  Driven to perform behaviors/acts; Intended to reduce  stress or prevent another outcome   Inattention:  Symptoms before age 16   Hyperactivity/Impulsivity:  Symptoms present before age 26   Oppositional/Defiant Behaviors:  None   Emotional Irregularity:  N/A   Other Mood/Personality Symptoms:  No data recorded   Mental Status Exam Appearance and self-care  Stature:  Average   Weight:  Overweight   Clothing:  Casual   Grooming:  Normal   Cosmetic use:  Age appropriate   Posture/gait:  Normal   Motor activity:  Not Remarkable   Sensorium  Attention:  Normal   Concentration:  Normal   Orientation:  X5   Recall/memory:  Normal   Affect and Mood  Affect:  Appropriate   Mood:  Euthymic   Relating  Eye contact:  Normal   Facial expression:  Anxious   Attitude toward examiner:  Cooperative   Thought and Language  Speech flow: Clear and Coherent   Thought content:  Appropriate to Mood and Circumstances   Preoccupation:  None   Hallucinations:  None   Organization:  No data recorded  [employer] of Knowledge:  Average   Intelligence:  Average   Abstraction:  Normal   Judgement:  Common-sensical   Reality Testing:  Realistic   Insight:  Fair   Decision Making:  Impulsive   Social Functioning  Social Maturity:  Impulsive   Social Judgement:  Naive   Stress  Stressors:  Grief/losses; Illness; Financial; Work   Coping Ability:  Overwhelmed; Resilient   Skill Deficits:  Interpersonal; Self-care; Self-control   Supports:  Church; Family; Friends/Service system    Religion: Religion/Spirituality Are You A Religious Person?: Yes What is Your Religious Affiliation?: Christian How Might This Affect Treatment?: Will not - engages in volunteer activities with her church  Leisure/Recreation: Leisure / Recreation Do You Have Hobbies?: Yes Leisure and Hobbies: go out to eat, hang out with family  Exercise/Diet: Exercise/Diet Do You Exercise?: No Have You Gained or Lost A  Significant Amount of Weight in the Past Six Months?: No Do You Follow a Special Diet?: Yes Type of Diet: works with a nutritionist Do You Have Any Trouble Sleeping?: Yes Explanation of Sleeping Difficulties: is scared she will have a seizure or fall off the bed so has nightmares  CCA Employment/Education Employment/Work Situation: Employment / Work Situation Employment Situation: On disability Why is Patient on Disability: seizures How Long has Patient Been on Disability: 2-3 years What is the Longest Time Patient has Held a Job?: 1 year Where was the Patient Employed at that Time?: [employer] Has Patient ever Been in the [employer]?: No  Education: Education Is Patient Currently Attending School?: No Last Grade Completed: 12 Name of High School: Patient graduated high school in the vocational track. Did You Graduate From [employer]?: Yes Did You Attend College?: No Did You Have An Individualized Education Program (IIEP): Yes Did You Have Any Difficulty At School?: Yes Were Any Medications Ever Prescribed For These Difficulties?: Yes Medications Prescribed For School Difficulties?: ADHD and IDD Patient's Education Has Been Impacted by Current Illness: Yes How Does Current Illness Impact Education?: focus, understanding, seizures, depression  CCA Family/Childhood History Family and Relationship History: Family history Marital status: Single Does patient have children?: No  Childhood History:  Childhood History By whom was/is the patient raised?: Mother, Father Additional childhood history information: Parents divorced when she was 4yo so she was raised  by them separately Description of patient's relationship with caregiver when they were a child: Mother - good, got along well; Father - good Patient's description of current relationship with people who raised him/her: Mother - close; Father - close, he has just been diagnosed with untreatable cancer How were you disciplined when  you got in trouble as a child/adolescent?: phone taken, yelled at Does patient have siblings?: Yes Number of Siblings: 1 Description of patient's current relationship with siblings: Older brother died of an overdose when the patient was 28yo. Did patient suffer any verbal/emotional/physical/sexual abuse as a child?: No Did patient suffer from severe childhood neglect?: No Has patient ever been sexually abused/assaulted/raped as an adolescent or adult?: No Was the patient ever a victim of a crime or a disaster?: No Witnessed domestic violence?: No Has patient been affected by domestic violence as an adult?: No  CCA Substance Use Alcohol/Drug Use: Alcohol / Drug Use Pain Medications: None Prescriptions: See MRN Over the Counter: PRN History of alcohol / drug use?: No history of alcohol / drug abuse Withdrawal Symptoms: None ASAM's:  Six Dimensions of Multidimensional Assessment  Dimension 1:  Acute Intoxication and/or Withdrawal Potential:   Dimension 1:  Description of individual's past and current experiences of substance use and withdrawal: None  Dimension 2:  Biomedical Conditions and Complications:   Dimension 2:  Description of patient's biomedical conditions and  complications: None  Dimension 3:  Emotional, Behavioral, or Cognitive Conditions and Complications:  Dimension 3:  Description of emotional, behavioral, or cognitive conditions and complications: None  Dimension 4:  Readiness to Change:  Dimension 4:  Description of Readiness to Change criteria: None  Dimension 5:  Relapse, Continued use, or Continued Problem Potential:  Dimension 5:  Relapse, continued use, or continued problem potential critiera description: None  Dimension 6:  Recovery/Living Environment:  Dimension 6:  Recovery/Iiving environment criteria description: None  ASAM Severity Score: ASAM's Severity Rating Score: 0  ASAM Recommended Level of Treatment: ASAM Recommended Level of Treatment: Level I Outpatient  Treatment   Substance use Disorder (SUD)  None  Recommendations for Services/Supports/Treatments: Recommendations for Services/Supports/Treatments Recommendations For Services/Supports/Treatments: Individual Therapy, Medication Management  DSM5 Diagnoses: Patient Active Problem List   Diagnosis Date Noted   Insulin  resistance 11/19/2023   Hyperlipemia 11/19/2023   Vitamin D  deficiency 11/19/2023   Mild intellectual disability 11/05/2022   Generalized anxiety disorder, severe    Major depressive disorder    OSA (obstructive sleep apnea) 04/10/2018   Migraine without aura and without status migrainosus, not intractable 05/31/2017   Nonintractable generalized idiopathic epilepsy without status epilepticus 12/17/2016   Obesity 11/18/2014   Acanthosis nigricans 11/18/2014   ADHD (attention deficit hyperactivity disorder), combined type 12/08/2013   Myoclonus 07/07/2013    Class: Acute   Patient Centered Plan: Patient is on the following Treatment Plan(s):  Anxiety and Post Traumatic Stress Disorder LTG: Learn a variety of breathing techniques and grounding strategies, practice in session then report independent application out of session.  STG: Learn and practice communication techniques such as active listening, I statements, open-ended questions, fair fighting rules, initiating conversations;  learn about boundary types and how to implement/enforce them AEB self-report of use of same.  STG: Score less than 9 on the PHQ-9 and less than 5 on the GAD-7 as evidenced by intermittent administration of the questionnaires to determine progress in managing depression and anxiety.    LTG: Learn a variety of coping skills and demonstrate the ability to use them to decrease  feelings of sadness, anger, and fear and increase feelings of happiness, peace, and powerfulness AEB gauging those emotions on 1-10 scale.   STG: Improve self-esteem about weight, body appearance, and food behaviors AEB  implementation of changes in food choices, weight management program involvement, and self-report of increasing confidence.    STG: Explore and resolve issues relating to history of abuse/neglect/trauma victimization that have contributed to presentation of anxiety, hypervigilance, rage, and other symptoms.   STG: Decrease cognitive distortions contributing negatively to mood and behavior by identifying 5-7 mind traps that are present; learn how to come up with replacement thoughts that are more balanced/realistic/helpful.  STG: Explore and make decisions about what is most likely to help Maria Day deal with her periods of derealization; implement chosen coping techniques.  STG: Learn about the feeling of anxiety and its many variations, the cycle of anxiety and how to interrupt that cycle.   Referrals to Alternative Service(s): Referred to Alternative Service(s):  not applicable Place:   Date:   Time:     Collaboration of Care: Psychiatrist AEB -psychiatrist can read therapy notes; therapist can and does read psychiatric notes prior to sessions   Patient/Guardian was advised Release of Information must be obtained prior to any record release in order to collaborate their care with an outside provider. Patient/Guardian was advised if they have not already done so to contact the registration department to sign all necessary forms in order for us  to release information regarding their care.   Consent: Patient/Guardian gives verbal consent for treatment and assignment of benefits for services provided during this visit. Patient/Guardian expressed understanding and agreed to proceed.   Recommendations:  See counselor every 2 weeks for a few months, then reevaluate     02/27/2024   12:38 PM 04/23/2023    2:52 PM 03/05/2023   10:30 AM 01/29/2023    4:16 PM 01/02/2023   12:16 PM  Depression screen PHQ 2/9  Decreased Interest 2 1 1 1 1   Down, Depressed, Hopeless 1 1 1 1 1   PHQ - 2 Score 3 2 2 2 2    Altered sleeping 2 1 1 1 1   Tired, decreased energy 1 1 1 1 1   Change in appetite 2 1 1  0 0  Feeling bad or failure about yourself  3 1 1 1  0  Trouble concentrating 1 1 1 1 1   Moving slowly or fidgety/restless 1 1 1 1 1   Suicidal thoughts 0 0 0 0 0  PHQ-9 Score 13 8 8 7 6   Difficult doing work/chores Somewhat difficult Somewhat difficult Somewhat difficult Somewhat difficult Somewhat difficult      02/27/2024   12:40 PM 04/30/2022   11:02 AM  GAD 7 : Generalized Anxiety Score  Nervous, Anxious, on Edge 3 3  Control/stop worrying 3 3  Worry too much - different things 3 3  Trouble relaxing 1 2  Restless 2 2  Easily annoyed or irritable 1 3  Afraid - awful might happen 1 3  Total GAD 7 Score 14 19  Anxiety Difficulty Somewhat difficult Somewhat difficult     Elgie JINNY Crest, LCSW

## 2024-02-28 ENCOUNTER — Encounter (HOSPITAL_COMMUNITY): Payer: Self-pay

## 2024-03-02 LAB — LAMOTRIGINE LEVEL: Lamotrigine Lvl: 4.5 ug/mL (ref 2.5–15.0)

## 2024-03-02 LAB — LEVETIRACETAM, IMMUNOASSAY: LEVETIRACETAM, IMMUNOASSAY: 19.1 ug/mL (ref 6.0–46.0)

## 2024-03-04 ENCOUNTER — Ambulatory Visit: Payer: Self-pay | Admitting: Neurology

## 2024-03-11 ENCOUNTER — Telehealth: Payer: Self-pay | Admitting: Neurology

## 2024-03-11 NOTE — Telephone Encounter (Signed)
 Pt mom called in this afternoon and she needs a diagnose for IDD service and future care. Thanks

## 2024-03-11 NOTE — Telephone Encounter (Signed)
**Note De-identified  [place] Obfuscation** Please advise 

## 2024-03-12 NOTE — Telephone Encounter (Signed)
 Pls call mom and ask what exactly she needs, does she need a letter and what does the letter need to say? Thanks

## 2024-03-12 NOTE — Telephone Encounter (Signed)
 Spoke to pt mom, stated need some type of  document with Dx since have testing in 11/05/22 for IDD service and future care.

## 2024-03-25 ENCOUNTER — Ambulatory Visit (HOSPITAL_COMMUNITY): Admitting: Clinical

## 2024-04-02 ENCOUNTER — Encounter (HOSPITAL_COMMUNITY): Payer: Self-pay | Admitting: Clinical

## 2024-04-02 ENCOUNTER — Ambulatory Visit (HOSPITAL_COMMUNITY): Admitting: Clinical

## 2024-04-02 DIAGNOSIS — F411 Generalized anxiety disorder: Secondary | ICD-10-CM | POA: Diagnosis not present

## 2024-04-02 DIAGNOSIS — F32A Depression, unspecified: Secondary | ICD-10-CM

## 2024-04-02 DIAGNOSIS — F902 Attention-deficit hyperactivity disorder, combined type: Secondary | ICD-10-CM | POA: Diagnosis not present

## 2024-04-02 DIAGNOSIS — F70 Mild intellectual disabilities: Secondary | ICD-10-CM | POA: Diagnosis not present

## 2024-04-02 DIAGNOSIS — F410 Panic disorder [episodic paroxysmal anxiety] without agoraphobia: Secondary | ICD-10-CM

## 2024-04-02 DIAGNOSIS — F481 Depersonalization-derealization syndrome: Secondary | ICD-10-CM

## 2024-04-02 NOTE — Progress Notes (Unsigned)
 THERAPIST PROGRESS NOTE  Session Time: 9:02-10:00am  Session #2  Participation Level: Active  Behavioral Response: Casual Alert Anxious  Type of Therapy: Individual Therapy with mother present for the entire session  Treatment Goals addressed:  LTG: Learn a variety of breathing techniques and grounding strategies, practice in session then report independent application out of session.  STG: Learn and practice communication techniques such as active listening, I statements, open-ended questions, fair fighting rules, initiating conversations;  learn about boundary types and how to implement/enforce them AEB self-report of use of same.  STG: Score less than 9 on the PHQ-9 and less than 5 on the GAD-7 as evidenced by intermittent administration of the questionnaires to determine progress in managing depression and anxiety.    LTG: Learn a variety of coping skills and demonstrate the ability to use them to decrease feelings of sadness, anger, and fear and increase feelings of happiness, peace, and powerfulness AEB gauging those emotions on 1-10 scale.   STG: Improve self-esteem about weight, body appearance, and food behaviors AEB implementation of changes in food choices, weight management program involvement, and self-report of increasing confidence.    STG: Explore and resolve issues relating to history of abuse/neglect/trauma victimization that have contributed to presentation of anxiety, hypervigilance, rage, and other symptoms.   STG: Decrease cognitive distortions contributing negatively to mood and behavior by identifying 5-7 mind traps that are present; learn how to come up with replacement thoughts that are more balanced/realistic/helpful.  STG: Explore and make decisions about what is most likely to help Maria Day deal with her periods of derealization; implement chosen coping techniques.  STG: Learn about the feeling of anxiety and its many variations, the cycle of anxiety and how to  interrupt that cycle.   ProgressTowards Goals: Progressing  Interventions: Supportive and Meditation: grounding  Summary: Maria Day is a 28 y.o. female who presents with a history of epilepsy, Generalized Anxiety Disorder, ADHD/combined type, moderate depression, and mild Intellectual Development Disorder as shown on a 11/05/22 neuropsychological evaluation by Dr. Arthea Maryland, with fairly recent addition of out-of-body sensations that might be characterized as derealization and/or dissociation.  She presented oriented x5 and stated she was feeling okay, but struggling with not feeling I'm in my body.  CSW evaluated patient's medication compliance, use of coping tools, and self-care, as applicable.  She provided an update on various aspects of her life that are normally discussed in therapy, including her ongoing issues with feeling out of her body and her ongoing social anxiety and panic attacks.  her experience of feeling she is not in her body is starting to get on my nerves and was determined to be the first problem to tackle in therapy.  She repeatedly spoke of how she is completely unable to go into stores, although she is fine with staying in the parking lot while her mother goes in.  She feels on the verge of a panic attack if she even thinks about going herself and in fact, she stated she feels on the verge of a panic attack 2-3 times a day regularly.  She wonders if the cause of her panic could be her seizure medicine, which CSW deferred discussing to her doctor.  She reported that she never had out-of-body experiences prior to when she was hospitalized for a seizure study. She also questions whether certain lighting can trigger her seizures and disclosed that she has tried wearing sunglasses in a store but that did not help.  Even talking about the  possibility of having a panic attack was bringing on more anxiety, indicating a panic disorder might be part of the make-up of her mental illness.   For the remainder of the session, CSW taught grounding skills using the 5 senses.  This was effective as long as we were in the middle of an exercise and she relaxed and laughed a few times.  She was given a handout for 5-4-3-2-1 grounding and was asked to practice at home, not just when she is actually anxious.  Suicidal/Homicidal: No without intent/plan  Therapist Response: Patient is progressing AEB engaging in scheduled therapy session.  Throughout the session, CSW gave patient the opportunity to explore thoughts and feelings associated with current life situations and past/present stressors.   CSW challenged patient gently and appropriately to consider different ways of looking at reported issues. CSW encouraged patient's expression of feelings and validated these using empathy, active listening, open body language, and unconditional positive regard.   Two additional appointments were scheduled.  Plan/Recommendations:  Return to therapy in 1 week to next scheduled appointment on 10/9, reflect on what was discussed in session, engage in self care behaviors as explored in session, do homework as assigned (practicing grounding skills taught in session), and return to next session prepared to talk about experience with new coping methods.   Diagnosis:  Generalized anxiety disorder with panic attacks  Intellectual developmental disorder, mild  Attention deficit hyperactivity disorder, combined type  Derealization (HCC)  Depression, unspecified depression type  Collaboration of Care: Psychiatrist AEB -psychiatrist can read therapy notes; therapist can and does read psychiatric notes prior to sessions   Patient/Guardian was advised Release of Information must be obtained prior to any record release in order to collaborate their care with an outside provider. Patient/Guardian was advised if they have not already done so to contact the registration department to sign all necessary forms in order  for us  to release information regarding their care.   Consent: Patient/Guardian gives verbal consent for treatment and assignment of benefits for services provided during this visit. Patient/Guardian expressed understanding and agreed to proceed.   Elgie JINNY Crest, LCSW 04/02/2024

## 2024-04-08 ENCOUNTER — Telehealth: Payer: Self-pay | Admitting: Bariatrics

## 2024-04-08 NOTE — Telephone Encounter (Signed)
 The patient had to cancel her last appointment on October 30th due to her mother undergoing knee surgery. She mentioned that Dr. Delores sent the Vitamin D  prescription once, but she did not pick it up because she did not need it at the time, and the pharmacy no longer has it. Now she is out of the vitamin D  and needs a refill but cannot come back to the office until November 17th. Please send her a message through MyChart or call her with your decision. Her phone number is [phone]. She would like to use [facility] located at [address] [place], [place]. Thank you!

## 2024-04-09 ENCOUNTER — Ambulatory Visit (HOSPITAL_COMMUNITY): Admitting: Clinical

## 2024-04-09 ENCOUNTER — Telehealth: Payer: Self-pay | Admitting: Neurology

## 2024-04-09 ENCOUNTER — Encounter (HOSPITAL_COMMUNITY): Payer: Self-pay | Admitting: Clinical

## 2024-04-09 DIAGNOSIS — F411 Generalized anxiety disorder: Secondary | ICD-10-CM | POA: Diagnosis not present

## 2024-04-09 DIAGNOSIS — F32A Depression, unspecified: Secondary | ICD-10-CM

## 2024-04-09 DIAGNOSIS — F902 Attention-deficit hyperactivity disorder, combined type: Secondary | ICD-10-CM

## 2024-04-09 DIAGNOSIS — F410 Panic disorder [episodic paroxysmal anxiety] without agoraphobia: Secondary | ICD-10-CM

## 2024-04-09 DIAGNOSIS — F70 Mild intellectual disabilities: Secondary | ICD-10-CM

## 2024-04-09 DIAGNOSIS — F481 Depersonalization-derealization syndrome: Secondary | ICD-10-CM | POA: Diagnosis not present
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
## 2024-04-09 NOTE — Telephone Encounter (Signed)
**Note De-identified  [place] Obfuscation** Please advise 

## 2024-04-09 NOTE — Telephone Encounter (Signed)
 PT's mother called and she  stated she need some type of  document with Dx since have testing in 11/05/22 for IDD service and future care.Pt's Mom would like this information mailed to her. Thanks

## 2024-04-10 NOTE — Telephone Encounter (Signed)
 LVM--to pt mother to call the office back.

## 2024-04-10 NOTE — Telephone Encounter (Signed)
 See other phone note

## 2024-04-10 NOTE — Telephone Encounter (Signed)
 The Neuropsychological evaluation give a full report on this. Does she want a copy of the report mailed to her?

## 2024-04-13 NOTE — Telephone Encounter (Signed)
 Pt's Mom is returning call.  Mom  wants a letter stating the  Dx to the IDD , and future wellness. Please mail.  Thanks

## 2024-04-16 ENCOUNTER — Other Ambulatory Visit: Payer: Self-pay

## 2024-04-16 ENCOUNTER — Other Ambulatory Visit (HOSPITAL_COMMUNITY): Payer: Self-pay | Admitting: Psychiatry

## 2024-04-16 DIAGNOSIS — F902 Attention-deficit hyperactivity disorder, combined type: Secondary | ICD-10-CM

## 2024-04-16 DIAGNOSIS — F0630 Mood disorder due to known physiological condition, unspecified: Secondary | ICD-10-CM

## 2024-04-16 DIAGNOSIS — F819 Developmental disorder of scholastic skills, unspecified: Secondary | ICD-10-CM

## 2024-04-16 DIAGNOSIS — E559 Vitamin D deficiency, unspecified: Secondary | ICD-10-CM

## 2024-04-16 DIAGNOSIS — F411 Generalized anxiety disorder: Secondary | ICD-10-CM

## 2024-04-16 MED ORDER — VITAMIN D (ERGOCALCIFEROL) 1.25 MG (50000 UNIT) PO CAPS: 50000.0000 [IU] | ORAL_CAPSULE | ORAL | 0 refills | Status: AC

## 2024-04-16 NOTE — Telephone Encounter (Signed)
 Please advise. Patient has called back regarding this situation.

## 2024-04-16 NOTE — Telephone Encounter (Signed)
 Notified patient that her prescription was sent to the pharmacy. Patient verbalized understanding.

## 2024-04-20 ENCOUNTER — Encounter: Payer: Self-pay | Admitting: Neurology

## 2024-04-20 NOTE — Telephone Encounter (Signed)
 Done, ready to mail, thanks

## 2024-04-20 NOTE — Telephone Encounter (Signed)
 LETTER MAILED

## 2024-04-23 ENCOUNTER — Ambulatory Visit (HOSPITAL_COMMUNITY): Admitting: Clinical

## 2024-04-23 ENCOUNTER — Encounter (HOSPITAL_COMMUNITY): Payer: Self-pay | Admitting: Clinical

## 2024-04-23 DIAGNOSIS — F70 Mild intellectual disabilities: Secondary | ICD-10-CM

## 2024-04-23 DIAGNOSIS — F481 Depersonalization-derealization syndrome: Secondary | ICD-10-CM

## 2024-04-23 DIAGNOSIS — F902 Attention-deficit hyperactivity disorder, combined type: Secondary | ICD-10-CM | POA: Diagnosis not present

## 2024-04-23 DIAGNOSIS — F411 Generalized anxiety disorder: Secondary | ICD-10-CM | POA: Diagnosis not present

## 2024-04-23 DIAGNOSIS — F410 Panic disorder [episodic paroxysmal anxiety] without agoraphobia: Secondary | ICD-10-CM

## 2024-04-23 DIAGNOSIS — F819 Developmental disorder of scholastic skills, unspecified: Secondary | ICD-10-CM | POA: Diagnosis not present

## 2024-04-23 DIAGNOSIS — F0630 Mood disorder due to known physiological condition, unspecified: Secondary | ICD-10-CM | POA: Diagnosis not present
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
## 2024-04-30 ENCOUNTER — Ambulatory Visit: Admitting: Bariatrics

## 2024-05-06 ENCOUNTER — Encounter (HOSPITAL_COMMUNITY): Payer: Self-pay

## 2024-05-06 ENCOUNTER — Ambulatory Visit (HOSPITAL_COMMUNITY): Admitting: Clinical

## 2024-05-06 DIAGNOSIS — N898 Other specified noninflammatory disorders of vagina: Secondary | ICD-10-CM | POA: Diagnosis not present

## 2024-05-06 DIAGNOSIS — Z124 Encounter for screening for malignant neoplasm of cervix: Secondary | ICD-10-CM | POA: Diagnosis not present

## 2024-05-06 DIAGNOSIS — Z01419 Encounter for gynecological examination (general) (routine) without abnormal findings: Secondary | ICD-10-CM | POA: Diagnosis not present

## 2024-05-07 DIAGNOSIS — N898 Other specified noninflammatory disorders of vagina: Secondary | ICD-10-CM | POA: Diagnosis not present

## 2024-05-07 NOTE — Progress Notes (Addendum)
 Therapy Progress Note  Patient had a virtual appointment scheduled with therapist on 05/07/2024  at 5:00pm for Group Therapy.   She did not attend group.  As per [facility] policy, this will be noted as a no-show appointment.    Encounter Diagnosis  Name Primary?   No-show for appointment Yes     Elgie Crest, LCSW 05/07/2024, 10:10 AM

## 2024-05-09 ENCOUNTER — Encounter: Payer: Self-pay | Admitting: Neurology

## 2024-05-18 ENCOUNTER — Ambulatory Visit: Admitting: Bariatrics

## 2024-05-20 ENCOUNTER — Other Ambulatory Visit: Payer: Self-pay | Admitting: Neurology

## 2024-05-20 DIAGNOSIS — G40309 Generalized idiopathic epilepsy and epileptic syndromes, not intractable, without status epilepticus: Secondary | ICD-10-CM

## 2024-05-20 DIAGNOSIS — F419 Anxiety disorder, unspecified: Secondary | ICD-10-CM

## 2024-05-20 MED ORDER — CLONAZEPAM 0.5 MG PO TABS: 0.5000 mg | ORAL_TABLET | Freq: Two times a day (BID) | ORAL | 5 refills | Status: DC

## 2024-05-20 NOTE — Telephone Encounter (Signed)
 Maria Day called in stating she called earlier needing Rx sent to [facility]. [facility] sent a text to her to let her know that its too early to fill. She wants to make sure that Rx will be sent to [facility] and not [facility]. She has requested a call back once RX has been sent.   PH: [phone]

## 2024-05-20 NOTE — Telephone Encounter (Signed)
 Pt is calling in , because she stated that she needs a refill on her prescription called:   clonazePAM  (KLONOPIN ) 0.5 MG tablet   Pt stated that when she tried to refill this  , the pharmacy stated that it was expired. The pharmacy told pt that the doctor needs  to send over a new order. Pt wants the prescription to go to [facility]. Address [address] [place] , [place] [address]. Pt is will be out of the prescription next .  Thanks

## 2024-05-21 ENCOUNTER — Other Ambulatory Visit: Payer: Self-pay | Admitting: Neurology

## 2024-05-21 ENCOUNTER — Encounter: Payer: Self-pay | Admitting: Neurology

## 2024-05-21 ENCOUNTER — Other Ambulatory Visit (HOSPITAL_COMMUNITY): Payer: Self-pay | Admitting: *Deleted

## 2024-05-21 DIAGNOSIS — F411 Generalized anxiety disorder: Secondary | ICD-10-CM

## 2024-05-21 DIAGNOSIS — F0630 Mood disorder due to known physiological condition, unspecified: Secondary | ICD-10-CM

## 2024-05-21 DIAGNOSIS — G40309 Generalized idiopathic epilepsy and epileptic syndromes, not intractable, without status epilepticus: Secondary | ICD-10-CM

## 2024-05-21 DIAGNOSIS — F419 Anxiety disorder, unspecified: Secondary | ICD-10-CM

## 2024-05-21 DIAGNOSIS — F819 Developmental disorder of scholastic skills, unspecified: Secondary | ICD-10-CM

## 2024-05-21 DIAGNOSIS — F902 Attention-deficit hyperactivity disorder, combined type: Secondary | ICD-10-CM

## 2024-05-21 MED ORDER — OLANZAPINE 5 MG PO TABS
5.0000 mg | ORAL_TABLET | Freq: Every day | ORAL | 0 refills | Status: DC
Start: 2024-05-21 — End: 2024-05-25

## 2024-05-21 NOTE — Telephone Encounter (Signed)
 I sent Maria Day a mychart message replying what with what Dr Georjean said From a seizure standpoint, it should be okay. This is not prescribed in our office, you will need to talk to your PCP if you are a candidate for it.

## 2024-05-21 NOTE — Telephone Encounter (Signed)
 Pt. Does not have any seizure meds till next Rx clonazePAM  (KLONOPIN ) 0.5 MG tablet   Dr Georjean I hate to keep bothering you but I need to speak with you about with taking my seizure meds which shots (weight Loss) can I take that won't interfere with my meds. If you could either MyChart me back or give me a call I would appreciate it I just want to make sure I am doing the right thing with my meds. Is there one Weight loss shot she can take that does not interfere   Keeya called in stating she called earlier needing Rx sent to [facility]. [facility] her to let her know that its too early to fill. She wants to make sure that Rx will be sent to [facility] and not [facility]. She has requested a call back once RX has been sent.

## 2024-05-22 MED ORDER — CLONAZEPAM 0.5 MG PO TABS: 0.5000 mg | ORAL_TABLET | Freq: Two times a day (BID) | ORAL | 5 refills | Status: DC

## 2024-05-25 ENCOUNTER — Telehealth (HOSPITAL_COMMUNITY): Admitting: Psychiatry

## 2024-05-25 ENCOUNTER — Encounter (HOSPITAL_COMMUNITY): Payer: Self-pay | Admitting: Psychiatry

## 2024-05-25 VITALS — Wt 200.0 lb

## 2024-05-25 DIAGNOSIS — F819 Developmental disorder of scholastic skills, unspecified: Secondary | ICD-10-CM | POA: Diagnosis not present

## 2024-05-25 DIAGNOSIS — F902 Attention-deficit hyperactivity disorder, combined type: Secondary | ICD-10-CM

## 2024-05-25 DIAGNOSIS — F411 Generalized anxiety disorder: Secondary | ICD-10-CM

## 2024-05-25 DIAGNOSIS — F0630 Mood disorder due to known physiological condition, unspecified: Secondary | ICD-10-CM

## 2024-05-25 MED ORDER — OLANZAPINE 5 MG PO TABS: 5.0000 mg | ORAL_TABLET | Freq: Every day | ORAL | 0 refills | Status: DC

## 2024-05-25 NOTE — Progress Notes (Signed)
 [facility] MD Virtual Progress Note   Patient Location: [facility] Provider Location: Office  I connect with patient by video and verified that I am speaking with correct person by using two identifiers. I discussed the limitations of evaluation and management by telemedicine and the availability of in person appointments. I also discussed with the patient that there may be a patient responsible charge related to this service. The patient expressed understanding and agreed to proceed.  Maria Day [id number] 28 y.o.  05/25/2024 1:05 PM  History of Present Illness:  Patient is evaluated by video session.  She is staying at her dad's house in [place].  She reported overall medicine working but there are sometimes when she gets frustrated, irritable and angry especially with her mother but still a very good relationship.  She admitted weight gain has not watching her calorie intake and not doing any exercise.  She has appointment coming up to see her new primary care and she like to discuss about her weight.  She is taking Klonopin  and Lamictal  prescribed by neurology.  She is happy that no more seizures but is still episodes of dissociation and sometimes feel that she is not in her body.  Her therapist Ms. Edwena is helping her a lot.  She occasionally has nightmares but overall she sleeps good with the help of olanzapine .  She has no rash, itching tremors or shakes.  She started therapy and she noticed improvement.  She denies any hallucination or any paranoia.  She denies any active or passive suicidal thoughts or homicidal thoughts.  She denies drinking or using any illegal substances.  She feels since taking the medication has no major panic attack but is still sometimes nervous and anxious around people.  Her attention concentration is fair.  She gets along with her family member but sometime argue with her mother.  Past Psychiatric History: H/O oppositional defiant, mood  swings, LD and ADHD. Had psychological testing with IQ around 65.  H/O struggle with grades in school but with help of VR finished high school with good grades. Took Concerta  for ADD and Risperdal for mood symptoms.  She was given BuSpar  and Geodon  which was discontinued after ineffective response.  No history of suicidal attempt, inpatient or abuse.   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   lamoTRIgine  (LAMICTAL ) 200 MG tablet Take 1 tablet (200 mg total) by mouth 2 (two) times daily.   levETIRAcetam  (KEPPRA ) 1000 MG tablet Take 1 tablet twice a day (Take with 500 mg tablet for a total of 1500 mg twice a day)   levETIRAcetam  (KEPPRA ) 500 MG tablet TAKE 1 TABLET BY MOUTH TWICE DAILY (TAKE WITH 1000MG  TO TOTAL 1500MG  TWICE DAILY )   OLANZapine  (ZYPREXA ) 5 MG tablet Take 1 tablet (5 mg total) by mouth at bedtime.  Vitamin D , Ergocalciferol , (DRISDOL ) 1.25 MG (50000 UNIT) CAPS capsule Take 1 capsule (50,000 Units total) by mouth every 7 (seven) days.   No facility-administered encounter medications on file as of 05/25/2024.    Recent Results (from the past 2160 hours)  Lamotrigine  level     Status: None   Collection Time: 02/26/24  8:20 AM  Result Value Ref Range   Lamotrigine  Lvl 4.5 2.5 -  15.0 mcg/mL    Comment: (Note) This test was developed and its analytical performance characteristics have been determined by [employer]. It has not been cleared or approved by the FDA. This assay has been validated pursuant to the CLIA regulations and is used for clinical purposes. . [facility] [address] [place] [address] [id number] Johanna Agent L. Gino, MD, PhD   Levetiracetam , Immunoassay     Status: None   Collection Time: 02/26/24  8:20 AM  Result Value Ref Range   LEVETIRACETAM , IMMUNOASSAY 19.1 6.0 - 46.0 mcg/mL    Comment: . Brivaracetam (Briviact(R), Rikelta(R)) exhibits significant cross-reactivity in the Levetiracetam  (Keppra (R), Spritam (R)) immunoassay. If Brivaracetam has been prescribed, order test code 84857 Levetiracetam  by LCMSMS. SABRA      Psychiatric Specialty Exam: Physical Exam  Review of Systems  Weight 200 lb (90.7 kg).There is no height or weight on file to calculate BMI.  General Appearance: Casual  Eye Contact:  Fair  Speech:  Slow  Volume:  Normal  Mood:  Anxious  Affect:  Appropriate  Thought Process:  Descriptions of Associations: Intact  Orientation:  Full (Time, Place, and Person)  Thought Content:  WDL  Suicidal Thoughts:  No  Homicidal Thoughts:  No  Memory:  Immediate;   Good Recent;   Fair Remote;   Fair  Judgement:  Intact  Insight:  Present  Psychomotor Activity:  Normal  Concentration:  Concentration: Fair and Attention Span: Fair  Recall:  Good  Fund of Knowledge:  Good  Language:  Good  Akathisia:  No  Handed:  Right  AIMS (if indicated):     Assets:  Communication Skills Desire for Improvement Housing Social Support Transportation  ADL's:  Intact  Cognition:  WNL  Sleep:  ok       02/27/2024   12:38 PM 04/23/2023    2:52 PM 03/05/2023   10:30 AM 01/29/2023    4:16 PM 01/02/2023   12:16 PM  Depression screen PHQ 2/9  Decreased Interest 2 1 1 1 1   Down, Depressed,  Hopeless 1 1 1 1 1   PHQ - 2 Score 3 2 2 2 2   Altered sleeping 2 1 1 1 1   Tired, decreased energy 1 1 1 1 1   Change in appetite 2 1 1  0 0  Feeling bad or failure about yourself  3 1 1 1  0  Trouble concentrating 1 1 1 1 1   Moving slowly or fidgety/restless 1 1 1 1 1   Suicidal thoughts 0 0 0 0 0  PHQ-9 Score 13  8  8  7  6    Difficult doing work/chores Somewhat difficult Somewhat difficult Somewhat difficult Somewhat difficult Somewhat difficult     Data saved with a previous flowsheet row definition    Assessment/Plan: GAD (generalized anxiety disorder) - Plan: OLANZapine  (ZYPREXA ) 5 MG tablet  Mood disorder in conditions classified elsewhere - Plan: OLANZapine  (ZYPREXA ) 5 MG tablet  Attention deficit hyperactivity disorder, combined type - Plan: OLANZapine  (ZYPREXA ) 5 MG tablet  Learning disorder - Plan: OLANZapine  (ZYPREXA ) 5  MG tablet  Patient is 28 year old female with history of migraine, sleep apnea, epilepsy, ADHD, learning disorder, generalized anxiety disorder and mood disorder.  Discussed concern about weight gain.  She had gained 10 pounds since the last visit.  She is going to see primary care and like to know if she can try some weight loss medication.  I recommend should consider Topamax or GLP 1 medicine.  Recommend to avoid phentermine as some time stimulant can cause worsening of mood symptoms.  Overall since started therapy she is doing much better.  I also recommend to have hemoglobin A1c and comprehensive metabolic panel.  Her last blood work was done in May which was normal.  She like to continue olanzapine .  I have discussed sometime olanzapine  can cause weight gain but patient preferred to keep that medication since it is working and helping her and she will contact the PCP to consider any weight loss medication.  Continue olanzapine  5 mg daily.  Continue therapy.  Recommend to call back if she has any question or any concern.  Patient has planned to stay with her father  until first week of January.  Will follow-up in 3 months unless patient need a sooner appointment.   Follow Up Instructions:     I discussed the assessment and treatment plan with the patient. The patient was provided an opportunity to ask questions and all were answered. The patient agreed with the plan and demonstrated an understanding of the instructions.   The patient was advised to call back or seek an in-person evaluation if the symptoms worsen or if the condition fails to improve as anticipated.    Collaboration of Care: Other provider involved in patient's care AEB notes are available in epic to review  Patient/Guardian was advised Release of Information must be obtained prior to any record release in order to collaborate their care with an outside provider. Patient/Guardian was advised if they have not already done so to contact the registration department to sign all necessary forms in order for us  to release information regarding their care.   Consent: Patient/Guardian gives verbal consent for treatment and assignment of benefits for services provided during this visit. Patient/Guardian expressed understanding and agreed to proceed.     Total encounter time 24 minutes which includes face-to-face time, chart reviewed, care coordination, order entry and documentation during this encounter.   Note: This document was prepared by [employer] voice dictation technology and any errors that results from this process are unintentional.    Leni ONEIDA Client, MD 05/25/2024

## 2024-05-31 ENCOUNTER — Encounter: Payer: Self-pay | Admitting: Bariatrics

## 2024-06-01 ENCOUNTER — Telehealth (HOSPITAL_COMMUNITY): Payer: Self-pay

## 2024-06-01 ENCOUNTER — Encounter (HOSPITAL_COMMUNITY): Payer: Self-pay

## 2024-06-01 DIAGNOSIS — E669 Obesity, unspecified: Secondary | ICD-10-CM | POA: Diagnosis not present

## 2024-06-01 DIAGNOSIS — Z131 Encounter for screening for diabetes mellitus: Secondary | ICD-10-CM | POA: Diagnosis not present

## 2024-06-01 DIAGNOSIS — F419 Anxiety disorder, unspecified: Secondary | ICD-10-CM | POA: Diagnosis not present

## 2024-06-01 DIAGNOSIS — R5383 Other fatigue: Secondary | ICD-10-CM | POA: Diagnosis not present

## 2024-06-01 DIAGNOSIS — Z1331 Encounter for screening for depression: Secondary | ICD-10-CM | POA: Diagnosis not present

## 2024-06-01 DIAGNOSIS — G40909 Epilepsy, unspecified, not intractable, without status epilepticus: Secondary | ICD-10-CM | POA: Diagnosis not present

## 2024-06-01 DIAGNOSIS — E785 Hyperlipidemia, unspecified: Secondary | ICD-10-CM | POA: Diagnosis not present

## 2024-06-01 DIAGNOSIS — Z1339 Encounter for screening examination for other mental health and behavioral disorders: Secondary | ICD-10-CM | POA: Diagnosis not present

## 2024-06-01 NOTE — Telephone Encounter (Signed)
 Called patient for more detail and sent phone message to the doctor

## 2024-06-01 NOTE — Telephone Encounter (Signed)
 We do not do weight loss management.  She should see her PCP for weight loss.  I have suggested she can consider Topamax or GLP-1 and get from PCP.  I have discussed these option with the patient on her last visit.

## 2024-06-01 NOTE — Telephone Encounter (Signed)
 Patient sent a mychart message this morning about her medications. She states she went to a doctor this morning and they told her to talk to you about something to help her weight. Patient has been restricting calories and exercising regularly and has had no result. She is requesting something to help with energy and weight

## 2024-06-03 ENCOUNTER — Encounter: Payer: Self-pay | Admitting: Neurology

## 2024-06-03 NOTE — Telephone Encounter (Signed)
 I called patient back and she will discuss with her PCP

## 2024-06-04 ENCOUNTER — Telehealth: Payer: Self-pay | Admitting: Neurology

## 2024-06-04 NOTE — Telephone Encounter (Signed)
 Pt was answered in mychart

## 2024-06-04 NOTE — Telephone Encounter (Signed)
 Pt called in this morning and she wants to make sure the wegovy is good for her  to take with the rest of my meds. Pt lefted a message on mychart as well. Thanks

## 2024-06-08 ENCOUNTER — Ambulatory Visit: Admitting: Neurology

## 2024-06-08 NOTE — Telephone Encounter (Signed)
Yes she can take it.

## 2024-06-10 ENCOUNTER — Encounter (HOSPITAL_COMMUNITY): Payer: Self-pay | Admitting: Clinical

## 2024-06-10 ENCOUNTER — Ambulatory Visit (INDEPENDENT_AMBULATORY_CARE_PROVIDER_SITE_OTHER): Admitting: Clinical

## 2024-06-10 DIAGNOSIS — F902 Attention-deficit hyperactivity disorder, combined type: Secondary | ICD-10-CM | POA: Diagnosis not present

## 2024-06-10 DIAGNOSIS — F0630 Mood disorder due to known physiological condition, unspecified: Secondary | ICD-10-CM | POA: Diagnosis not present

## 2024-06-10 DIAGNOSIS — F411 Generalized anxiety disorder: Secondary | ICD-10-CM | POA: Diagnosis not present

## 2024-06-10 DIAGNOSIS — F819 Developmental disorder of scholastic skills, unspecified: Secondary | ICD-10-CM | POA: Diagnosis not present
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
## 2024-07-08 ENCOUNTER — Ambulatory Visit (INDEPENDENT_AMBULATORY_CARE_PROVIDER_SITE_OTHER): Admitting: Clinical

## 2024-07-08 DIAGNOSIS — Z91199 Patient's noncompliance with other medical treatment and regimen due to unspecified reason: Secondary | ICD-10-CM

## 2024-07-08 NOTE — Progress Notes (Signed)
 Therapy Progress Note  Patient had an appointment scheduled with therapist on 07/08/2024  at 10am.  CSW called patient at 10:17am and left a HIPAA-compliant message.  She did not arrive for the session by 10:20am, so was considered a no show.  As per [facility] policy, this will be noted as a no-show appointment.    Encounter Diagnosis  Name Primary?   No-show for appointment Yes     Elgie Crest, LCSW 07/08/2024, 10:20 AM

## 2024-07-09 NOTE — Addendum Note (Signed)
 Addended by: GROSSMAN-ORR, Selicia Windom J on: 07/09/2024 01:02 PM   Modules accepted: Level of Service

## 2024-07-16 ENCOUNTER — Ambulatory Visit (HOSPITAL_COMMUNITY): Admitting: Clinical

## 2024-07-16 ENCOUNTER — Encounter (HOSPITAL_COMMUNITY): Payer: Self-pay | Admitting: Clinical

## 2024-07-16 DIAGNOSIS — F0630 Mood disorder due to known physiological condition, unspecified: Secondary | ICD-10-CM

## 2024-07-16 DIAGNOSIS — F411 Generalized anxiety disorder: Secondary | ICD-10-CM

## 2024-07-16 DIAGNOSIS — F902 Attention-deficit hyperactivity disorder, combined type: Secondary | ICD-10-CM | POA: Diagnosis not present

## 2024-07-16 NOTE — Progress Notes (Signed)
 THERAPIST PROGRESS NOTE  Session Time: 9:01am-9:59am  Session #6  Virtual Visit via Video Note  I connected with Maria Day on 07/16/24 at  9:00 AM EST by a video enabled telemedicine application and verified that I am speaking with the correct person using two identifiers.  Location: Patient: mother's home Provider: [facility] outpatient therapy office - [place]    I discussed the limitations of evaluation and management by telemedicine and the availability of in person appointments. The patient expressed understanding and agreed to proceed.   I discussed the assessment and treatment plan with the patient. The patient was provided an opportunity to ask questions and all were answered. The patient agreed with the plan and demonstrated an understanding of the instructions.   The patient was advised to call back or seek an in-person evaluation if the symptoms worsen or if the condition fails to improve as anticipated.  I provided 58 minutes of non-face-to-face time during this encounter.  Elgie JINNY Crest, LCSW   Participation Level: Active  Behavioral Response: Casual Lethargic Anxious  Type of Therapy: Individual Therapy   Treatment Goals addressed:  LTG: Learn a variety of breathing techniques and grounding strategies, practice in session then report independent application out of session.  STG: Learn and practice communication techniques such as active listening, I statements, open-ended questions, fair fighting rules, initiating conversations;  learn about boundary types and how to implement/enforce them AEB self-report of use of same.  STG: Score less than 9 on the PHQ-9 and less than 5 on the GAD-7 as evidenced by intermittent administration of the questionnaires to determine progress in managing depression and anxiety.    LTG: Learn a variety of coping skills and demonstrate the ability to use them to decrease feelings of sadness, anger, and fear and increase feelings  of happiness, peace, and powerfulness AEB gauging those emotions on 1-10 scale.   STG: Improve self-esteem about weight, body appearance, and food behaviors AEB implementation of changes in food choices, weight management program involvement, and self-report of increasing confidence.    STG: Explore and resolve issues relating to history of abuse/neglect/trauma victimization that have contributed to presentation of anxiety, hypervigilance, rage, and other symptoms.   STG: Decrease cognitive distortions contributing negatively to mood and behavior by identifying 5-7 mind traps that are present; learn how to come up with replacement thoughts that are more balanced/realistic/helpful.  STG: Explore and make decisions about what is most likely to help Maria deal with her periods of derealization; implement chosen coping techniques.  STG: Learn about the feeling of anxiety and its many variations, the cycle of anxiety and how to interrupt that cycle.   ProgressTowards Goals: Progressing  Interventions: CBT, Psychosocial Skills: organization, Supportive, and Other: food choices   Summary: Maria Day is a 29 y.o. female who presents with a history of epilepsy, Generalized Anxiety Disorder, ADHD/combined type, moderate depression, and mild Intellectual Development Disorder as shown on a 11/05/22 neuropsychological evaluation by Dr. Arthea Maryland, with fairly recent addition of out-of-body sensations that might be characterized as derealization and/or dissociation.  Patient reported she is currently staying at her mothers home but plans to return to her fathers residence soon.  Things have been going relatively well with her mother, with typical disagreements primarily related to household chores not being completed while her mother is at work. She cknowledged awareness that her mother is recovering from knee revision surgery and works on her feet all day, contributing to fatigue; however, patient stated she  dislikes doing  chores and therefore tends to avoid them. She identified feeling overwhelmed by clutter and referred to herself as lazy, which was explored and reframed during session.  CSW provided psychoeducation and practical strategies to address task avoidance and overwhelm, including: setting a 15-30 minute timer with permission to stop when it ends; bookending chores by checking in with someone before and after; making written lists and checking off completed items; completing chores earlier in the day and rewarding herself later; and breaking chores into smaller, discrete steps. Patient participated in an exercise identifying current chores and estimating the time required, noting that even tasks estimated at five minutes felt difficult. Patient also reported experiencing headaches and stated that resting when tired is sometimes preventative to avoid more severe headaches.  CSW reviewed CBT, specifically the connection between behaviors and emotions. Patient rated her confidence at completing her chores today as 6/10 and verbalized understanding that her confidence would likely increase as she completed various tasks. When asked to identify a specific plan following session, patient stated she would fold clothes, rest briefly, and then put clothes away.  Homework from prior session to do CBT questioning of three thoughts was reviewed. Patient shared her work, and it became clear she did not understand the assignment and had not utilized CBT techniques. CSW noted need for continued simplification and reinforcement of CBT skills. Patient has not yet obtained a notebook for session notes and homework; CSW strongly encouraged her to purchase one as soon as possible. Brief discussion occurred regarding dietary changes; patient reported incorporating guidance from her diet doctor and aunt and is exploring healthy foods she enjoys. Patient reported the session went great, stating she learned a lot and  values learning better ways to approach tasks.  Suicidal/Homicidal: No without intent/plan  Therapist Response: Patient presents with ongoing difficulty with task initiation and completion related to feeling overwhelmed, negative self-labeling, and possible somatic factors (headaches, fatigue). Insight is improving, as evidenced by willingness to reframe self-criticism and engage in behavioral strategies. Executive functioning challenges and anxiety/avoidance patterns remain evident. CBT skills are not yet well understood and require further clarification and repetition. Motivation for change is present, and patient reports positive therapeutic engagement.  Plan/Recommendations:  Return to therapy at next scheduled appointment on 3/5, reflect on what was discussed in session, engage in self care behaviors as explored in session, do homework as assigned (remember to purchase notebook to use for sessions, use some of the techniques suggested to get chores done), and return to next session prepared to talk about experience with new coping methods.   CSW will (1) continue CBT-focused interventions with emphasis on simplifying concepts and using concrete, behavioral strategies., (2) reinforce use of timers, task breakdown, and behavioral activation to address avoidance, (3) encourage patient to obtain and consistently use a notebook for session notes and homework, (4) reassign CBT thought-record homework in a simplified format and review in next session, (5) monitor headaches and impact on functioning; encourage pacing and self-monitoring. Next session will review implementation of chore strategies, confidence levels, and understanding of CBT techniques.  Diagnosis:  GAD (generalized anxiety disorder)  Attention deficit hyperactivity disorder, combined type  Mood disorder in conditions classified elsewhere  Collaboration of Care: Psychiatrist AEB -psychiatrist can read therapy notes; therapist can and does  read psychiatric notes prior to sessions   Patient/Guardian was advised Release of Information must be obtained prior to any record release in order to collaborate their care with an outside provider. Patient/Guardian was advised if they have  not already done so to contact the registration department to sign all necessary forms in order for us  to release information regarding their care.   Consent: Patient/Guardian gives verbal consent for treatment and assignment of benefits for services provided during this visit. Patient/Guardian expressed understanding and agreed to proceed.   Elgie JINNY Crest, LCSW 07/16/2024

## 2024-07-21 ENCOUNTER — Other Ambulatory Visit (HOSPITAL_COMMUNITY): Payer: Self-pay | Admitting: Psychiatry

## 2024-07-21 ENCOUNTER — Telehealth: Admitting: Neurology

## 2024-07-21 ENCOUNTER — Encounter (HOSPITAL_COMMUNITY): Payer: Self-pay

## 2024-07-21 ENCOUNTER — Encounter: Payer: Self-pay | Admitting: Neurology

## 2024-07-21 ENCOUNTER — Other Ambulatory Visit (HOSPITAL_COMMUNITY): Payer: Self-pay | Admitting: *Deleted

## 2024-07-21 VITALS — Ht 60.0 in | Wt 185.0 lb

## 2024-07-21 DIAGNOSIS — G4733 Obstructive sleep apnea (adult) (pediatric): Secondary | ICD-10-CM | POA: Diagnosis not present

## 2024-07-21 DIAGNOSIS — F819 Developmental disorder of scholastic skills, unspecified: Secondary | ICD-10-CM

## 2024-07-21 DIAGNOSIS — F0630 Mood disorder due to known physiological condition, unspecified: Secondary | ICD-10-CM

## 2024-07-21 DIAGNOSIS — F902 Attention-deficit hyperactivity disorder, combined type: Secondary | ICD-10-CM

## 2024-07-21 DIAGNOSIS — G40309 Generalized idiopathic epilepsy and epileptic syndromes, not intractable, without status epilepticus: Secondary | ICD-10-CM | POA: Diagnosis not present

## 2024-07-21 DIAGNOSIS — F411 Generalized anxiety disorder: Secondary | ICD-10-CM

## 2024-07-21 DIAGNOSIS — F419 Anxiety disorder, unspecified: Secondary | ICD-10-CM

## 2024-07-21 MED ORDER — LAMOTRIGINE 200 MG PO TABS: 200.0000 mg | ORAL_TABLET | Freq: Two times a day (BID) | ORAL | 3 refills | Status: AC

## 2024-07-21 MED ORDER — OLANZAPINE 5 MG PO TABS: 5.0000 mg | ORAL_TABLET | Freq: Every day | ORAL | 0 refills | Status: AC

## 2024-07-21 MED ORDER — LEVETIRACETAM 1000 MG PO TABS: ORAL_TABLET | ORAL | 3 refills | Status: AC

## 2024-07-21 MED ORDER — LEVETIRACETAM 500 MG PO TABS: ORAL_TABLET | ORAL | 3 refills | Status: AC

## 2024-07-21 MED ORDER — CLONAZEPAM 0.5 MG PO TABS: 0.5000 mg | ORAL_TABLET | Freq: Two times a day (BID) | ORAL | 5 refills | Status: AC

## 2024-07-21 NOTE — Progress Notes (Signed)
 "  Virtual Visit via Video Note  This visit type was conducted with patient consent. This format is felt to be most appropriate for this patient at this time. Physical exam was limited by quality of the video and audio technology used for the visit.    Consent was obtained for video visit:  Yes.   Answered questions that patient had about telehealth interaction:  Yes.   Patient is aware of the limitations, risks, security and privacy concerns of performing an evaluation and management service by telemedicine. The patient expressed understanding and agreed to proceed.  Pt location: Home Physician Location: office Name of referring provider:  No ref. provider found I connected with Maria Day at patients initiation/request on 07/21/2024 at  4:00 PM EST by video enabled telemedicine application and verified that I am speaking with the correct person using two identifiers. Pt MRN:  [id number] Pt DOB:  04-18-1996 Video Participants:  Maria Day  Discussed the use of AI scribe software for clinical note transcription with the patient, who gave verbal consent to proceed.  History of Present Illness The patient had a virtual video visit on 07/21/2024. She was last seen in the neurology clinic 9 months ago for seizures. She is happy to report that she has been seizure-free since 03/2022 on Levetiracetam  1500mg  BID and Lamotrigine  200mg  BID without side effects. She had a different type of episode about a month ago where she felt really out of it. She had a feeling she was about to have a seizure, she was eating and had to sit down. It was hard to talk, then she snapped out of it after a minute. She felt very sleepy after. No sleep deprivation or antecedent illness. She continues to have the same out of body feeling on Zyprexa  and with regular therapy, she states none of what she is doing seem to be helping with these. She does note that quality of life is much better, she is driving without issues.  She takes the clonazepam  0.5mg  BID. She feels sleepy during the day and her mother has mentioned she snores and has apneic episodes. She was diagnosed with OSA in 2019 and was followed at [facility], she had difficulty with different masks. She reports feeling funny after one dose of Wegovy so she stopped it.   History on Initial Assessment 04/04/2022: This is a 29 year old right-handed woman with a history of ADD, OSA, and seizures, presenting to establish care. I had previously seen her in 2018 however she then went to [facility] in 2019-2022 for further epilepsy management. She was started on Lamotrigine  in 2019. She was seizure-free from 2018 to 08/2020 when she called about a blackout episode with no body jerking, then called to report more seizures with grand mal and staring episodes, Lamotrigine  increased to 100-200mg  in 11/2020. Levetiracetam  increased to 1000mg  BID in 12/2020. She then saw epileptologist Dr. Gregg in 04/2021. They were reporting mild body tremors, inability to respond to verbal stimuli. Routine and Ambulatory EEG were normal in 06/2021. She was admitted to the EMU from February 20-24, 2023 where baseline EEG showed generalized spikes and polyspikes with right frontal predominance, predominantly seen during sleep. Seizure medications were discontinued and 2 events were captured. The first seizure was on 2/21, she was initially off camera for the first minute, then she was seen spinning in counterclockwise direction, left arm elevated with subtle jerking, then fell. Left arm and leg were extended, right arm flexed and adductor,  leg flexed and abducted followed by figure-of-four posturing with left arm extension and right arm flexion. Onset showed generalized and maximal right frontal spike followed by 6-7 Hz theta activity which appeared generalized and maximal in right hemisphere. The second seizure was on 2/23, after about 15 seconds of EEG seizure, she had left  upper extremity flexed, elevated, and abducted with patient turning to the left and forced left gaze deviation. She was holding something in the right arm which was flexed and adducted, then she had figure-of-four posturing with left upper extremity extension and right upper extremity flexion, followed by convulsion. EEG onset showed similar changes to prior seizure. 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 She was discharged home on Levetiracetam  1500mg  BID, Lamotrigine  200mg  BID, and Clobazam  10mg  daily was added to her regimen. On her follow-up with Dr. Gregg in 11/2021, she continued to report out of body phenomenon and feeling like she would have a seizure, as well as lethargy/fatigue on Clobazam . Symptoms were felt due to anxiety as she is under a lot of stress, and clonazepam  0.5mg  BID was started. She did well on the clonazepam  but when she ran out, she was started on Hydroxyzine , which made her feel weird. She was prescribed Lacosamide  but did not start it. She felt being off clonazepam  led to increase in seizure-like activity. She was advised to follow-up with Psychiatry but has not been able to. She saw her PCP in [place]. [name] who agreed with me that Fluoxetine  is not working, she patient self-discontinued it a month ago. She states the clonazepam  really helps calm her anxiety, when she dose not take it, anxiety is almost uncontrollable. She states that she has very, very, very bad anxiety, and has not felt like herself in a very long time. She feels like she is an alien in her body. She is scared to go out of the house, which is new since February.   Her mother reports that the episodes where she would get nauseated, crunch down and shake (not violently) had stopped since February, however her convulsive seizures have recurred where she is foaming at the mouth and breathing bad. She states her left hand  would stiffen up, then she wakes up on the floor. She states this is new and started after her EMU admission. Her aunt recalls a seizure at the beach last May, she was sitting on the couch then it looked like she was grabbing something on her left side with left arm extended, turned in a circle, and fell out. She vomited after and had a horrible headache. Last seizure was 2 weeks ago, she was getting something out of the dryer then her left hand stiffened up and she woke up on the floor. Her grandmother reported she was foaming at the mouth, eyes rolled back, seizure lasted 5 minutes. She feels nauseated and weak after, no focal weakness. She typically has headaches after a seizure. Yesterday she had a sick headache with nausea, no vomiting and wears sunglasses in the office today.  She has a contentious relationship with her mother. She was previously living in her own apartment, alternating between her apartment and father's house. Her father was recently diagnosed with a medical condition and she moved back in with her mother. She feels her mother wants to control everything. Her mother and aunt expressed concern that about living alone and making decisions to live on her own, stating  she has had troubles in her life processing issues, needing reminders to brush her teeth for instance. Prior records from Dr. Susen indicated she had psychoeducational testing that revealed an IQ of 28. Genetic testing was normal. She lost 2 jobs and is on disability.  Epilepsy Risk Factors:  She had a febrile seizure at age 47. Her mother reports there was a knot in the umbilical cord and meconium in amniotic fluid, she needed antibiotics for a few days. Otherwise she had a normal early development.  There is no history of CNS infections such as meningitis/encephalitis, significant traumatic brain injury, neurosurgical procedures, or family history of seizures.   Prior ASMs: Levetiracetam , Zonisamide, Topiramate,  clobazam  (lethargy and fatigue)  Diagnostic Data: Brain MRI with and without contrast in 2018 was normal. Brain MRI with and without contrast done 05/14/2022 was normal, hippocampi symmetric with no abnormal signal or enhancement seen.  EEG in 2019 was abnormal due to the presence of occasional generalized irregular high voltage 4-5 Hz spike and polyspike and wave discharges, some with right-sided predominance.  Routine EEG in 06/2021 was normal.  Ambulatory EEG in 06/2021 was normal, one event of seeing flashes of light when closing eyes with no changes in the EEG background. A normal EEG does not exclude a diagnosis of epilepsy.  EMU admission 08/21/21 to 08/25/21 captured 2 events which were consistent with seizures, with generalized onset.  However, focal epilepsy from right frontal region could also have similar appearance.  Unfortunately, we were unable to capture the new events patient has had during which patient does not feel well, comes to her mother and then has full body shaking.  It is possible that patient has coexisting nonepileptic events.    Laboratory Data from 07/2022: Levetiracetam  level 23.4 Lamotrigine  level 5.3   Medications Ordered Prior to Encounter[1]   Observations/Objective:   GEN:  The patient appears stated age and is in NAD. Neurological examination: Patient is awake, alert. Intact fluency and comprehension. Cranial nerves: Extraocular movements intact with no nystagmus. No facial asymmetry. Motor: moves all extremities symmetrically, at least anti-gravity x 4.    Assessment and Plan:   This is a 29yo RH woman with a history of ADD, OSA, anxiety, depression, and seizures. She has had seizures since childhood with generalized convulsions and myoclonic jerks. During her EMU admission in February 2023, she had 2 convulsive seizures with focal clinical features, EEG pattern could be seen with generalized epilepsy however focal epilepsy from the right frontal region  could also have similar appearance. MRI brain normal. She has been seizure-free since 03/2022 on Levetiracetam  1500mg  BID and Lamotrigine  200mg  BID. She has clonazepam  0.5mg  BID for anxiety. Continue follow-up with Behavioral Health. She reports daytime drowsiness and apnea at night, previously diagnosed with moderate OSA but lost to follow-up. She would like to establish care with Sleep Medicine again and is interested if she would be a candidate for the [facility] device. She is aware of [place] driving laws to stop driving after a seizure until 6 months seizure-free. Follow-up in 6 months, call for any changes.    Follow Up Instructions:   -I discussed the assessment and treatment plan with the patient. The patient was provided an opportunity to ask questions and all were answered. The patient agreed with the plan and demonstrated an understanding of the instructions.   The patient was advised to call back or seek an in-person evaluation if the symptoms worsen or if the condition fails to improve as anticipated.  Maria CHRISTELLA Shivers, MD     [1]  Current Outpatient Medications on File Prior to Visit  Medication Sig Dispense Refill   clonazePAM  (KLONOPIN ) 0.5 MG tablet Take 1 tablet (0.5 mg total) by mouth 2 (two) times daily. 60 tablet 5   lamoTRIgine  (LAMICTAL ) 200 MG tablet Take 1 tablet (200 mg total) by mouth 2 (two) times daily. 180 tablet 3   levETIRAcetam  (KEPPRA ) 1000 MG tablet Take 1 tablet twice a day (Take with 500 mg tablet for a total of 1500 mg twice a day) 180 tablet 3   levETIRAcetam  (KEPPRA ) 500 MG tablet TAKE 1 TABLET BY MOUTH TWICE DAILY (TAKE WITH 1000MG  TO TOTAL 1500MG  TWICE DAILY ) 180 tablet 3   OLANZapine  (ZYPREXA ) 5 MG tablet Take 1 tablet (5 mg total) by mouth at bedtime. 90 tablet 0   Vitamin D , Ergocalciferol , (DRISDOL ) 1.25 MG (50000 UNIT) CAPS capsule Take 1 capsule (50,000 Units total) by mouth every 7 (seven) days. 12 capsule 0   No current facility-administered  medications on file prior to visit.   "

## 2024-07-21 NOTE — Patient Instructions (Signed)
 Good to see you doing well!  Referral will be sent to Sleep Medicine for sleep apnea and consideration for [facility] if you are a candidate  2. Continue all your medications  3. Follow-up in 6 months, call for any changes   Seizure Precautions: 1. If medication has been prescribed for you to prevent seizures, take it exactly as directed.  Do not stop taking the medicine without talking to your doctor first, even if you have not had a seizure in a long time.   2. Avoid activities in which a seizure would cause danger to yourself or to others.  Don't operate dangerous machinery, swim alone, or climb in high or dangerous places, such as on ladders, roofs, or girders.  Do not drive unless your doctor says you may.  3. If you have any warning that you may have a seizure, lay down in a safe place where you can't hurt yourself.    4.  No driving for 6 months from last seizure, as per [place]  state law.   Please refer to the following link on the [employer]ile[employer] of [employer]'s website for more information: http://www.epilepsyfoundation.org/answerplace/Social/driving/drivingu.cfm   5.  Maintain good sleep hygiene. Avoid alcohol.  6.  Notify your neurology if you are planning pregnancy or if you become pregnant.  7.  Contact your doctor if you have any problems that may be related to the medicine you are taking.  8.  Call 911 and bring the patient back to the ED if:        A.  The seizure lasts longer than 5 minutes.       B.  The patient doesn't awaken shortly after the seizure  C.  The patient has new problems such as difficulty seeing, speaking or moving  D.  The patient was injured during the seizure  E.  The patient has a temperature over 102 F (39C)  F.  The patient vomited and now is having trouble breathing

## 2024-08-06 ENCOUNTER — Ambulatory Visit (HOSPITAL_COMMUNITY): Admitting: Clinical

## 2024-08-24 ENCOUNTER — Telehealth (HOSPITAL_COMMUNITY): Admitting: Psychiatry

## 2024-09-01 ENCOUNTER — Ambulatory Visit: Admitting: Neurology

## 2024-09-02 ENCOUNTER — Ambulatory Visit: Admitting: Primary Care

## 2024-09-03 ENCOUNTER — Ambulatory Visit (HOSPITAL_COMMUNITY): Admitting: Clinical

## 2024-09-03 ENCOUNTER — Ambulatory Visit (HOSPITAL_COMMUNITY): Admitting: Licensed Clinical Social Worker

## 2025-01-28 ENCOUNTER — Ambulatory Visit: Payer: Self-pay | Admitting: Neurology
